# Patient Record
Sex: Female | Born: 2000 | Race: White | Hispanic: No | Smoking: Former smoker
Health system: Southern US, Community
[De-identification: ages and names within clinical notes are randomized; demographics above are authoritative.]

## PROBLEM LIST (undated history)

## (undated) DIAGNOSIS — I493 Ventricular premature depolarization: Secondary | ICD-10-CM

## (undated) DIAGNOSIS — R0789 Other chest pain: Secondary | ICD-10-CM

## (undated) DIAGNOSIS — I10 Essential (primary) hypertension: Secondary | ICD-10-CM

## (undated) DIAGNOSIS — O139 Gestational [pregnancy-induced] hypertension without significant proteinuria, unspecified trimester: Secondary | ICD-10-CM

## (undated) DIAGNOSIS — R011 Cardiac murmur, unspecified: Secondary | ICD-10-CM

## (undated) DIAGNOSIS — F419 Anxiety disorder, unspecified: Secondary | ICD-10-CM

## (undated) DIAGNOSIS — Z464 Encounter for fitting and adjustment of orthodontic device: Secondary | ICD-10-CM

## (undated) DIAGNOSIS — IMO0001 Reserved for inherently not codable concepts without codable children: Secondary | ICD-10-CM

## (undated) DIAGNOSIS — O24419 Gestational diabetes mellitus in pregnancy, unspecified control: Secondary | ICD-10-CM

## (undated) DIAGNOSIS — I340 Nonrheumatic mitral (valve) insufficiency: Secondary | ICD-10-CM

## (undated) DIAGNOSIS — R002 Palpitations: Secondary | ICD-10-CM

## (undated) DIAGNOSIS — F32A Depression, unspecified: Secondary | ICD-10-CM

## (undated) DIAGNOSIS — K589 Irritable bowel syndrome without diarrhea: Secondary | ICD-10-CM

## (undated) HISTORY — PX: TONSILLECTOMY: SUR1361

## (undated) HISTORY — DX: Gestational diabetes mellitus in pregnancy, unspecified control: O24.419

## (undated) HISTORY — DX: Depression, unspecified: F32.A

## (undated) HISTORY — DX: Morbid (severe) obesity due to excess calories: E66.01

## (undated) HISTORY — DX: Other chest pain: R07.89

## (undated) HISTORY — DX: Nonrheumatic mitral (valve) insufficiency: I34.0

## (undated) HISTORY — DX: Palpitations: R00.2

---
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
## 2020-01-01 ENCOUNTER — Other Ambulatory Visit: Payer: Self-pay

## 2020-01-01 ENCOUNTER — Ambulatory Visit (INDEPENDENT_AMBULATORY_CARE_PROVIDER_SITE_OTHER): Payer: Medicaid Other | Admitting: Certified Nurse Midwife

## 2020-01-01 VITALS — BP 124/86 | HR 64 | Wt 255.5 lb

## 2020-01-01 DIAGNOSIS — Z3490 Encounter for supervision of normal pregnancy, unspecified, unspecified trimester: Secondary | ICD-10-CM

## 2020-01-01 DIAGNOSIS — O219 Vomiting of pregnancy, unspecified: Secondary | ICD-10-CM

## 2020-01-01 MED ORDER — DOXYLAMINE-PYRIDOXINE 10-10 MG PO TBEC
10.0000 mg | DELAYED_RELEASE_TABLET | Freq: Every day | ORAL | 1 refills | Status: DC
Start: 2020-01-01 — End: 2020-02-12

## 2020-01-01 NOTE — Patient Instructions (Signed)
WHAT OB PATIENTS CAN EXPECT   Confirmation of pregnancy and ultrasound ordered if medically indicated-[redacted] weeks gestation  New OB (NOB) intake with nurse and New OB (NOB) labs- [redacted] weeks gestation  New OB (NOB) physical examination with provider- 11/[redacted] weeks gestation  Flu vaccine-[redacted] weeks gestation  Anatomy scan-[redacted] weeks gestation  Glucose tolerance test, blood work to test for anemia, T-dap vaccine-[redacted] weeks gestation  Vaginal swabs/cultures-STD/Group B strep-[redacted] weeks gestation  Appointments every 4 weeks until 28 weeks  Every 2 weeks from 28 weeks until 36 weeks  Weekly visits from 36 weeks until delivery  Morning Sickness  Morning sickness is when you feel sick to your stomach (nauseous) during pregnancy. You may feel sick to your stomach and throw up (vomit). You may feel sick in the morning, but you can feel this way at any time of day. Some women feel very sick to their stomach and cannot stop throwing up (hyperemesis gravidarum). Follow these instructions at home: Medicines  Take over-the-counter and prescription medicines only as told by your doctor. Do not take any medicines until you talk with your doctor about them first.  Taking multivitamins before getting pregnant can stop or lessen the harshness of morning sickness. Eating and drinking  Eat dry toast or crackers before getting out of bed.  Eat 5 or 6 small meals a day.  Eat dry and bland foods like rice and baked potatoes.  Do not eat greasy, fatty, or spicy foods.  Have someone cook for you if the smell of food causes you to feel sick or throw up.  If you feel sick to your stomach after taking prenatal vitamins, take them at night or with a snack.  Eat protein when you need a snack. Nuts, yogurt, and cheese are good choices.  Drink fluids throughout the day.  Try ginger ale made with real ginger, ginger tea made from fresh grated ginger, or ginger candies. General instructions  Do not use any products  that have nicotine or tobacco in them, such as cigarettes and e-cigarettes. If you need help quitting, ask your doctor.  Use an air purifier to keep the air in your house free of smells.  Get lots of fresh air.  Try to avoid smells that make you feel sick.  Try: ? Wearing a bracelet that is used for seasickness (acupressure wristband). ? Going to a doctor who puts thin needles into certain body points (acupuncture) to improve how you feel. Contact a doctor if:  You need medicine to feel better.  You feel dizzy or light-headed.  You are losing weight. Get help right away if:  You feel very sick to your stomach and cannot stop throwing up.  You pass out (faint).  You have very bad pain in your belly. Summary  Morning sickness is when you feel sick to your stomach (nauseous) during pregnancy.  You may feel sick in the morning, but you can feel this way at any time of day.  Making some changes to what you eat may help your symptoms go away. This information is not intended to replace advice given to you by your health care provider. Make sure you discuss any questions you have with your health care provider. Document Revised: 02/24/2017 Document Reviewed: 04/14/2016 Elsevier Patient Education  2020 [employer]. How a Baby Grows During Pregnancy  Pregnancy begins when a female's sperm enters a female's egg (fertilization). Fertilization usually happens in one of the tubes (fallopian tubes) that connect the ovaries to the  womb (uterus). The fertilized egg moves down the fallopian tube to the uterus. Once it reaches the uterus, it implants into the lining of the uterus and begins to grow. For the first 10 weeks, the fertilized egg is called an embryo. After 10 weeks, it is called a fetus. As the fetus continues to grow, it receives oxygen and nutrients through tissue (placenta) that grows to support the developing baby. The placenta is the life support system for the baby. It provides  oxygen and nutrition and removes waste. Learning as much as you can about your pregnancy and how your baby is developing can help you enjoy the experience. It can also make you aware of when there might be a problem and when to ask questions. How long does a typical pregnancy last? A pregnancy usually lasts 280 days, or about 40 weeks. Pregnancy is divided into three periods of growth, also called trimesters:  First trimester: 0-12 weeks.  Second trimester: 13-27 weeks.  Third trimester: 28-40 weeks. The day when your baby is ready to be born (full term) is your estimated date of delivery. How does my baby develop month by month? First month  The fertilized egg attaches to the inside of the uterus.  Some cells will form the placenta. Others will form the fetus.  The arms, legs, brain, spinal cord, lungs, and heart begin to develop.  At the end of the first month, the heart begins to beat. Second month  The bones, inner ear, eyelids, hands, and feet form.  The genitals develop.  By the end of 8 weeks, all major organs are developing. Third month  All of the internal organs are forming.  Teeth develop below the gums.  Bones and muscles begin to grow. The spine can flex.  The skin is transparent.  Fingernails and toenails begin to form.  Arms and legs continue to grow longer, and hands and feet develop.  The fetus is about 3 inches (7.6 cm) long. Fourth month  The placenta is completely formed.  The external sex organs, neck, outer ear, eyebrows, eyelids, and fingernails are formed.  The fetus can hear, swallow, and move its arms and legs.  The kidneys begin to produce urine.  The skin is covered with a white, waxy coating (vernix) and very fine hair (lanugo). Fifth month  The fetus moves around more and can be felt for the first time (quickening).  The fetus starts to sleep and wake up and may begin to suck its finger.  The nails grow to the end of the  fingers.  The organ in the digestive system that makes bile (gallbladder) functions and helps to digest nutrients.  If your baby is a girl, eggs are present in her ovaries. If your baby is a boy, testicles start to move down into his scrotum. Sixth month  The lungs are formed.  The eyes open. The brain continues to develop.  Your baby has fingerprints and toe prints. Your baby's hair grows thicker.  At the end of the second trimester, the fetus is about 9 inches (22.9 cm) long. Seventh month  The fetus kicks and stretches.  The eyes are developed enough to sense changes in light.  The hands can make a grasping motion.  The fetus responds to sound. Eighth month  All organs and body systems are fully developed and functioning.  Bones harden, and taste buds develop. The fetus may hiccup.  Certain areas of the brain are still developing. The skull remains soft.   Ninth month  The fetus gains about  lb (0.23 kg) each week.  The lungs are fully developed.  Patterns of sleep develop.  The fetus's head typically moves into a head-down position (vertex) in the uterus to prepare for birth.  The fetus weighs 6-9 lb (2.72-4.08 kg) and is 19-20 inches (48.26-50.8 cm) long. What can I do to have a healthy pregnancy and help my baby develop? General instructions  Take prenatal vitamins as directed by your health care provider. These include vitamins such as folic acid, iron, calcium, and vitamin D. They are important for healthy development.  Take medicines only as directed by your health care provider. Read labels and ask a pharmacist or your health care provider whether over-the-counter medicines, supplements, and prescription drugs are safe to take during pregnancy.  Keep all follow-up visits as directed by your health care provider. This is important. Follow-up visits include prenatal care and screening tests. How do I know if my baby is developing well? At each prenatal visit,  your health care provider will do several different tests to check on your health and keep track of your baby's development. These include:  Fundal height and position. ? Your health care provider will measure your growing belly from your pubic bone to the top of the uterus using a tape measure. ? Your health care provider will also feel your belly to determine your baby's position.  Heartbeat. ? An ultrasound in the first trimester can confirm pregnancy and show a heartbeat, depending on how far along you are. ? Your health care provider will check your baby's heart rate at every prenatal visit.  Second trimester ultrasound. ? This ultrasound checks your baby's development. It also may show your baby's gender. What should I do if I have concerns about my baby's development? Always talk with your health care provider about any concerns that you may have about your pregnancy and your baby. Summary  A pregnancy usually lasts 280 days, or about 40 weeks. Pregnancy is divided into three periods of growth, also called trimesters.  Your health care provider will monitor your baby's growth and development throughout your pregnancy.  Follow your health care provider's recommendations about taking prenatal vitamins and medicines during your pregnancy.  Talk with your health care provider if you have any concerns about your pregnancy or your developing baby. This information is not intended to replace advice given to you by your health care provider. Make sure you discuss any questions you have with your health care provider. Document Revised: 07/05/2018 Document Reviewed: 01/25/2017 Elsevier Patient Education  [place]. Common Medications Safe in Pregnancy  Acne:      Constipation:  Benzoyl Peroxide     Colace  Clindamycin      Dulcolax Suppository  Topica Erythromycin     Fibercon  Salicylic Acid      Metamucil         Miralax AVOID:         Senakot   Accutane    Cough:  Retin-A       Cough Drops  Tetracycline      Phenergan w/ Codeine if Rx  Minocycline      Robitussin (Plain & DM)  Antibiotics:     Crabs/Lice:  Ceclor       RID  Cephalosporins    AVOID:  E-Mycins      Kwell  Keflex  Macrobid/Macrodantin   Diarrhea:  Penicillin      Kao-Pectate  Zithromax  Imodium AD         PUSH FLUIDS AVOID:       Cipro     Fever:  Tetracycline      Tylenol (Regular or Extra  Minocycline       Strength)  Levaquin      Extra Strength-Do not          Exceed 8 tabs/24 hrs Caffeine:        '200mg'$ /day (equiv. To 1 cup of coffee or  approx. 3 12 oz sodas)         Gas: Cold/Hayfever:       Gas-X  Benadryl      Mylicon  Claritin       Phazyme  **Claritin-D        Chlor-Trimeton    Headaches:  Dimetapp      ASA-Free Excedrin  Drixoral-Non-Drowsy     Cold Compress  Mucinex (Guaifenasin)     Tylenol (Regular or Extra  Sudafed/Sudafed-12 Hour     Strength)  **Sudafed PE Pseudoephedrine   Tylenol Cold & Sinus     Vicks Vapor Rub  Zyrtec  **AVOID if Problems With Blood Pressure         Heartburn: Avoid lying down for at least 1 hour after meals  Aciphex      Maalox     Rash:  Milk of Magnesia     Benadryl    Mylanta       1% Hydrocortisone Cream  Pepcid  Pepcid Complete   Sleep Aids:  Prevacid      Ambien   Prilosec       Benadryl  Rolaids       Chamomile Tea  Tums (Limit 4/day)     Unisom         Tylenol PM         Warm milk-add vanilla or  Hemorrhoids:       Sugar for taste  Anusol/Anusol H.C.  (RX: Analapram 2.5%)  Sugar Substitutes:  Hydrocortisone OTC     Ok in moderation  Preparation H      Tucks        Vaseline lotion applied to tissue with wiping    Herpes:     Throat:  Acyclovir      Oragel  Famvir  Valtrex     Vaccines:         Flu Shot Leg Cramps:       *Gardasil  Benadryl      Hepatitis A         Hepatitis B Nasal Spray:       Pneumovax  Saline Nasal Spray     Polio  Booster         Tetanus Nausea:       Tuberculosis test or PPD  Vitamin B6 25 mg TID   AVOID:    Dramamine      *Gardasil  Emetrol       Live Poliovirus  Ginger Root 250 mg QID    MMR (measles, mumps &  High Complex Carbs @ Bedtime    rebella)  Sea Bands-Accupressure    Varicella (Chickenpox)  Unisom 1/2 tab TID     *No known complications           If received before Pain:         Known pregnancy;   Darvocet       Resume series after  Lortab        Delivery  Percocet  Yeast:   Tramadol      Femstat  Tylenol 3      Gyne-lotrimin  Ultram       Monistat  Vicodin           MISC:         All Sunscreens           Hair Coloring/highlights          Insect Repellant's          (Including DEET)         [facility]  [address] [place], [place], [place] [address]  Phone: [phone]   [facility] (second location)  [address] [place], [place] [address]  Phone: [phone]   [facility]) [place], [place], [place] [address] Phone: [phone]   [facility] [place], [place] [address]  Phone: [phone]

## 2020-01-02 LAB — URINALYSIS, ROUTINE W REFLEX MICROSCOPIC
Bilirubin, UA: NEGATIVE
Glucose, UA: NEGATIVE
Ketones, UA: NEGATIVE
Nitrite, UA: NEGATIVE
RBC, UA: NEGATIVE
Specific Gravity, UA: >=1.03 — AB (ref 1.005–1.030)
Urobilinogen, Ur: 0.2 mg/dL (ref 0.2–1.0)
pH, UA: 5.5 (ref 5.0–7.5)

## 2020-01-02 LAB — HEMOGLOBIN A1C
Est. average glucose Bld gHb Est-mCnc: 123 mg/dL
Hgb A1c MFr Bld: 5.9 % — ABNORMAL HIGH (ref 4.8–5.6)

## 2020-01-02 LAB — ANTIBODY SCREEN: Antibody Screen: NEGATIVE

## 2020-01-02 LAB — RPR: RPR Ser Ql: NONREACTIVE

## 2020-01-02 LAB — TSH: TSH: 2.67 u[IU]/mL (ref 0.450–4.500)

## 2020-01-02 LAB — HIV ANTIBODY (ROUTINE TESTING W REFLEX): HIV Screen 4th Generation wRfx: NONREACTIVE

## 2020-01-02 LAB — VARICELLA ZOSTER ANTIBODY, IGG: Varicella zoster IgG: <135 index — ABNORMAL LOW (ref >165)

## 2020-01-02 LAB — MICROSCOPIC EXAMINATION
Casts: NONE SEEN /lpf
Epithelial Cells (non renal): >10 /hpf — AB (ref 0–10)

## 2020-01-02 LAB — HEPATITIS B SURFACE ANTIGEN: Hepatitis B Surface Ag: NEGATIVE

## 2020-01-02 LAB — RUBELLA SCREEN: Rubella Antibodies, IGG: <0.9 index — ABNORMAL LOW (ref >0.99)

## 2020-01-03 LAB — URINE CULTURE, OB REFLEX

## 2020-01-03 LAB — CULTURE, OB URINE

## 2020-01-05 LAB — GC/CHLAMYDIA PROBE AMP
Chlamydia trachomatis, NAA: NEGATIVE
Neisseria Gonorrhoeae by PCR: NEGATIVE

## 2020-01-07 ENCOUNTER — Other Ambulatory Visit: Payer: Self-pay | Admitting: Certified Nurse Midwife

## 2020-01-07 DIAGNOSIS — Z789 Other specified health status: Secondary | ICD-10-CM

## 2020-01-08 ENCOUNTER — Other Ambulatory Visit: Payer: Self-pay

## 2020-01-08 ENCOUNTER — Ambulatory Visit (INDEPENDENT_AMBULATORY_CARE_PROVIDER_SITE_OTHER): Payer: Medicaid Other

## 2020-01-08 DIAGNOSIS — Z789 Other specified health status: Secondary | ICD-10-CM | POA: Diagnosis not present

## 2020-01-08 DIAGNOSIS — Z3A08 8 weeks gestation of pregnancy: Secondary | ICD-10-CM | POA: Diagnosis not present

## 2020-02-05 ENCOUNTER — Encounter: Payer: Self-pay | Admitting: Emergency Medicine

## 2020-02-05 ENCOUNTER — Emergency Department
Admission: EM | Admit: 2020-02-05 | Discharge: 2020-02-05 | Disposition: A | Discharge status: Home / Self Care | Payer: Medicaid Other | Attending: Student in an Organized Health Care Education/Training Program | Admitting: Student in an Organized Health Care Education/Training Program

## 2020-02-05 ENCOUNTER — Other Ambulatory Visit: Payer: Self-pay

## 2020-02-05 DIAGNOSIS — Z87891 Personal history of nicotine dependence: Secondary | ICD-10-CM | POA: Diagnosis not present

## 2020-02-05 DIAGNOSIS — Z20822 Contact with and (suspected) exposure to covid-19: Secondary | ICD-10-CM | POA: Insufficient documentation

## 2020-02-05 DIAGNOSIS — R0981 Nasal congestion: Secondary | ICD-10-CM

## 2020-02-05 DIAGNOSIS — I10 Essential (primary) hypertension: Secondary | ICD-10-CM | POA: Diagnosis not present

## 2020-02-05 DIAGNOSIS — J069 Acute upper respiratory infection, unspecified: Secondary | ICD-10-CM | POA: Diagnosis not present

## 2020-02-05 DIAGNOSIS — O99512 Diseases of the respiratory system complicating pregnancy, second trimester: Secondary | ICD-10-CM | POA: Diagnosis not present

## 2020-02-05 DIAGNOSIS — Z3A13 13 weeks gestation of pregnancy: Secondary | ICD-10-CM | POA: Diagnosis not present

## 2020-02-05 LAB — RESPIRATORY PANEL BY RT PCR (FLU A&B, COVID)
Influenza A by PCR: NEGATIVE
Influenza B by PCR: NEGATIVE
SARS Coronavirus 2 by RT PCR: NEGATIVE

## 2020-02-05 LAB — CBC
HCT: 37.3 % (ref 36.0–46.0)
Hemoglobin: 12.8 g/dL (ref 12.0–15.0)
MCH: 29 pg (ref 26.0–34.0)
MCHC: 34.3 g/dL (ref 30.0–36.0)
MCV: 84.6 fL (ref 80.0–100.0)
Platelets: 257 10*3/uL (ref 150–400)
RBC: 4.41 MIL/uL (ref 3.87–5.11)
RDW: 12.2 % (ref 11.5–15.5)
WBC: 7.9 10*3/uL (ref 4.0–10.5)
nRBC: 0 % (ref 0.0–0.2)

## 2020-02-05 LAB — BASIC METABOLIC PANEL
Anion gap: 11 (ref 5–15)
BUN: 6 mg/dL (ref 6–20)
CO2: 20 mmol/L — ABNORMAL LOW (ref 22–32)
Calcium: 9.1 mg/dL (ref 8.9–10.3)
Chloride: 105 mmol/L (ref 98–111)
Creatinine, Ser: 0.37 mg/dL — ABNORMAL LOW (ref 0.44–1.00)
GFR, Estimated: >60 mL/min (ref >60)
Glucose, Bld: 141 mg/dL — ABNORMAL HIGH (ref 70–99)
Potassium: 3.6 mmol/L (ref 3.5–5.1)
Sodium: 136 mmol/L (ref 135–145)

## 2020-02-05 MED ORDER — AMOXICILLIN 200 MG/5ML PO SUSR: 250.0000 mg | Freq: Three times a day (TID) | ORAL | 0 refills | Status: AC

## 2020-02-05 MED ORDER — SODIUM CHLORIDE 0.9 % IV BOLUS: 1000.0000 mL | Freq: Once | INTRAVENOUS | Status: DC

## 2020-02-05 NOTE — ED Triage Notes (Signed)
Called lab to check on blood results.  Lab states no blood received.  Will redraw patient.

## 2020-02-05 NOTE — Discharge Instructions (Signed)
Be sure to continue drinking plenty of fluids.  Follow-up with your PCP as well as OB/GYN.  I sent a prescription for amoxicillin to your pharmacy.  Please start taking this you take it 3 times daily.  Please return if you have any worsening symptoms additional questions or concerns.

## 2020-02-05 NOTE — ED Provider Notes (Signed)
[facility] Emergency Department Provider Note    First MD Initiated Contact with Patient 02/05/20 1752     (approximate)  I have reviewed the triage vital signs and the nursing notes.   HISTORY  Chief Complaint Epistaxis    HPI Stacy Soto is a 19 y.o. female is roughly [redacted] weeks pregnant presents to the ER for evaluation of upper respiratory symptoms including congestion sore throat itchy eyes for the past 7 to 10 days.  States she is taken to negative Covid test.  States during pregnancy she has had issues with nausea and decreased p.o. intake.  She has felt some lightheadedness.  No numbness or tingling.  No severe headaches.  No chest pain or shortness of breath.  Not been on any antibiotics.  Does have a history of sinusitis.    Past Medical History:  Diagnosis Date  . Anxiety   . Cold    head cold for last 7 days (06/14/16)  improving  . Heart murmur    innocent  . Hypertension   . Orthodontics    wears braces  . PVCs (premature ventricular contractions)    Family History  Problem Relation Age of Onset  . Other Mother        Laurette Schimke problems  . Diabetes Father   . Breast cancer Paternal Grandmother   . Diabetes Paternal Grandfather    Past Surgical History:  Procedure Laterality Date  . ADENOIDECTOMY  2005  . NASAL TURBINATE REDUCTION Left 06/28/2016   Procedure: TURBINATE REDUCTION/SUBMUCOSAL RESECTION;  Surgeon: Geanie Logan, MD;  Location: [facility];  Service: ENT;  Laterality: Left;  . SEPTOPLASTY N/A 06/28/2016   Procedure: SEPTOPLASTY;  Surgeon: Geanie Logan, MD;  Location: [facility];  Service: ENT;  Laterality: N/A;  . TONSILLECTOMY  2005  . TONSILLECTOMY     Patient Active Problem List   Diagnosis Date Noted  . Encounter for IUD insertion 02/27/2019      Prior to Admission medications   Medication Sig Start Date End Date Taking? Authorizing Provider  amoxicillin (AMOXIL) 200 MG/5ML suspension Take 6.3  mLs (250 mg total) by mouth 3 (three) times daily for 5 days. 02/05/20 02/10/20  Willy Eddy, MD  dicyclomine (BENTYL) 10 MG capsule Take 1 capsule (10 mg total) by mouth 4 (four) times daily for 14 days. 10/25/19 11/08/19  Enid Derry, PA-C  Doxylamine-Pyridoxine 10-10 MG TBEC Take 10 mg by mouth daily. 01/01/20   Doreene Burke, CNM  ondansetron (ZOFRAN) 4 MG tablet Take 1 tablet (4 mg total) by mouth daily as needed for nausea or vomiting. Patient not taking: Reported on 01/01/2020 10/25/19 10/24/20  Enid Derry, PA-C  ondansetron (ZOFRAN-ODT) 4 MG disintegrating tablet Take 1 tablet (4 mg total) by mouth every 8 (eight) hours as needed for nausea or vomiting. Patient not taking: Reported on 01/01/2020 09/11/19   Kem Boroughs B, FNP  sertraline (ZOLOFT) 100 MG tablet Take by mouth. 02/15/19 04/16/19  [provider]    Allergies Patient has no known allergies.    Social History Social History   Tobacco Use  . Smoking status: Former Smoker    Types: Cigarettes    Quit date: 11/01/2019    Years since quitting: 0.2  . Smokeless tobacco: Never Used  Vaping Use  . Vaping Use: Never used  Substance Use Topics  . Alcohol use: Never  . Drug use: Never    Review of Systems Patient denies headaches, rhinorrhea, blurry vision, numbness, shortness  of breath, chest pain, edema, cough, abdominal pain, nausea, vomiting, diarrhea, dysuria, fevers, rashes or hallucinations unless otherwise stated above in HPI. ____________________________________________   PHYSICAL EXAM:  VITAL SIGNS: Vitals:   02/05/20 1724 02/05/20 1952  BP: 139/87 134/78  Pulse: (!) 110 96  Resp: 16 18  Temp: 97.9 F (36.6 C)   SpO2: 98% 100%    Constitutional: Alert and oriented.  Eyes: Conjunctivae are normal. Conjunctival injection, no purulence, no proptosis,  Head: Atraumatic. Nose: + rhonrhea and congestion,  bilat nasal polyps, no sign of bleeding Mouth/Throat: Mucous membranes are moist.    Neck: No stridor. Painless ROM.  Cardiovascular: Normal rate, regular rhythm. Grossly normal heart sounds.  Good peripheral circulation. Respiratory: Normal respiratory effort.  No retractions. Lungs CTAB. Gastrointestinal: Soft and nontender. No distention. No abdominal bruits. No CVA tenderness. Genitourinary: deferred Musculoskeletal: No lower extremity tenderness nor edema.  No joint effusions. Neurologic:  Normal speech and language. No gross focal neurologic deficits are appreciated. No facial droop Skin:  Skin is warm, dry and intact. No rash noted. Psychiatric: Mood and affect are normal. Speech and behavior are normal.  ____________________________________________   LABS (all labs ordered are listed, but only abnormal results are displayed)  Results for orders placed or performed during the hospital encounter of 02/05/20 (from the past 24 hour(s))  Basic metabolic panel     Status: Abnormal   Collection Time: 02/05/20  5:23 PM  Result Value Ref Range   Sodium 136 135 - 145 mmol/L   Potassium 3.6 3.5 - 5.1 mmol/L   Chloride 105 98 - 111 mmol/L   CO2 20 (L) 22 - 32 mmol/L   Glucose, Bld 141 (H) 70 - 99 mg/dL   BUN 6 6 - 20 mg/dL   Creatinine, Ser 3.76 (L) 0.44 - 1.00 mg/dL   Calcium 9.1 8.9 - 28.3 mg/dL   GFR, Estimated >15 >17 mL/min   Anion gap 11 5 - 15  CBC     Status: None   Collection Time: 02/05/20  5:23 PM  Result Value Ref Range   WBC 7.9 4.0 - 10.5 K/uL   RBC 4.41 3.87 - 5.11 MIL/uL   Hemoglobin 12.8 12.0 - 15.0 g/dL   HCT 61.6 36 - 46 %   MCV 84.6 80.0 - 100.0 fL   MCH 29.0 26.0 - 34.0 pg   MCHC 34.3 30.0 - 36.0 g/dL   RDW 07.3 71.0 - 62.6 %   Platelets 257 150 - 400 K/uL   nRBC 0.0 0.0 - 0.2 %  Respiratory Panel by RT PCR (Flu A&B, Covid) - Nasopharyngeal Swab     Status: None   Collection Time: 02/05/20  6:59 PM   Specimen: Nasopharyngeal Swab  Result Value Ref Range   SARS Coronavirus 2 by RT PCR NEGATIVE NEGATIVE   Influenza A by PCR NEGATIVE  NEGATIVE   Influenza B by PCR NEGATIVE NEGATIVE   ____________________________________________  EKG My review and personal interpretation at Time: 16:10   Indication: uri  Rate: 110  Rhythm: sinus Axis: normal Other: normal intervals, no stemi ____________________________________________  RADIOLOGY  EMERGENCY DEPARTMENT [place] PREGNANCY "Study: Limited Ultrasound of the Pelvis for Pregnancy"  INDICATIONS:Pregnancy(required) Multiple views of the uterus and pelvic cavity were obtained in real-time with a multi-frequency probe.  APPROACH:Transabdominal  PERFORMED BY: Myself IMAGES ARCHIVED?: No LIMITATIONS: none PREGNANCY FREE FLUID: Present ADNEXAL FINDINGS: none INTERPRETATION: Reassuring pelvic ultrasound with IUP and reassuring fetal movement     ____________________________________________   PROCEDURES  Procedure(s) performed:  Procedures    Critical Care performed: no ____________________________________________   INITIAL IMPRESSION / ASSESSMENT AND PLAN / ED COURSE  Pertinent labs & imaging results that were available during my care of the patient were reviewed by me and considered in my medical decision making (see chart for details).   DDX: URI, sinusitis, epistaxis, Covid, dehydration, pneumonia, CVT, strep throat  Stacy Soto is a 19 y.o. who presents to the ED with presentation as described above.  She is afebrile she is nontoxic.  No sign of epistaxis at this time.  No clinical exam findings to suggest pneumonia.  Blood work is reassuring.  Bedside ultrasound also reassuring she is primarily concerned about the baby and felt reassured after seeing the ultrasound.  She does have findings consistent with sinusitis and URI.  I did recommend repeating a Covid as many of her symptoms are consistent with this.  If that is negative however will plan to treat for sinusitis given that she has been having the symptoms for over 7 days I think it course of antibiotics  would be appropriate.  Clinical Course as of Feb 04 2322  Wed Feb 05, 2020  3614 Feel she needs IV fluids at this time she is otherwise very well-appearing clinically.  Do believe she is appropriate for outpatient follow-up.  Discussed usual return precautions.   [PR]    Clinical Course User Index [PR] Willy Eddy, MD    The patient was evaluated in Emergency Department today for the symptoms described in the history of present illness. He/she was evaluated in the context of the global COVID-19 pandemic, which necessitated consideration that the patient might be at risk for infection with the SARS-CoV-2 virus that causes COVID-19. Institutional protocols and algorithms that pertain to the evaluation of patients at risk for COVID-19 are in a state of rapid change based on information released by regulatory bodies including the CDC and federal and state organizations. These policies and algorithms were followed during the patient's care in the ED.  As part of my medical decision making, I reviewed the following data within the electronic MEDICAL RECORD NUMBER Nursing notes reviewed and incorporated, Labs reviewed, notes from prior ED visits and [place] Controlled Substance Database   ____________________________________________   FINAL CLINICAL IMPRESSION(S) / ED DIAGNOSES  Final diagnoses:  Upper respiratory tract infection, unspecified type  Congestion of nasal sinus      NEW MEDICATIONS STARTED DURING THIS VISIT:  Discharge Medication List as of 02/05/2020  7:33 PM    START taking these medications   Details  amoxicillin (AMOXIL) 200 MG/5ML suspension Take 6.3 mLs (250 mg total) by mouth 3 (three) times daily for 5 days., Starting Wed 02/05/2020, Until Mon 02/10/2020, Normal         Note:  This document was prepared using Dragon voice recognition software and may include unintentional dictation errors.    Willy Eddy, MD 02/05/20 [phone]

## 2020-02-05 NOTE — ED Triage Notes (Signed)
See first RN Note. Pt A&O x4. Clamp removed in triage, bleeding noted to be stopped at this time.   Pt also c/o dizziness, states is [redacted] weeks pregnant at this time. Pt also noted to be tachycardic. Pt otherwise A&O x4.

## 2020-02-05 NOTE — ED Triage Notes (Signed)
First Nurse NOte:  Arrives with c/o nose bleed x 10 min PTA.  Nose clamp placed on patient.  Bleeding appears to be controlled.  AAOx3.  Skin warm and dry. NAD

## 2020-02-05 NOTE — ED Notes (Signed)
Unable to have pt sign discharge d/t computer froze when pulling up topaz. PT verbalizes d/c instructions and denies further questions.

## 2020-02-12 ENCOUNTER — Other Ambulatory Visit: Payer: Self-pay

## 2020-02-12 ENCOUNTER — Encounter: Payer: Self-pay | Admitting: Certified Nurse Midwife

## 2020-02-12 ENCOUNTER — Ambulatory Visit (INDEPENDENT_AMBULATORY_CARE_PROVIDER_SITE_OTHER): Payer: Medicaid Other | Admitting: Certified Nurse Midwife

## 2020-02-12 DIAGNOSIS — Z3A13 13 weeks gestation of pregnancy: Secondary | ICD-10-CM | POA: Diagnosis not present

## 2020-02-12 DIAGNOSIS — O99211 Obesity complicating pregnancy, first trimester: Secondary | ICD-10-CM

## 2020-02-12 DIAGNOSIS — Z8679 Personal history of other diseases of the circulatory system: Secondary | ICD-10-CM

## 2020-02-12 DIAGNOSIS — I493 Ventricular premature depolarization: Secondary | ICD-10-CM

## 2020-02-12 LAB — POCT URINALYSIS DIPSTICK OB
Bilirubin, UA: NEGATIVE
Blood, UA: NEGATIVE
Glucose, UA: NEGATIVE
Ketones, UA: NEGATIVE
Leukocytes, UA: NEGATIVE
Nitrite, UA: NEGATIVE
POC,PROTEIN,UA: NEGATIVE
Spec Grav, UA: 1.015 (ref 1.010–1.025)
Urobilinogen, UA: 0.2 E.U./dL
pH, UA: 5 (ref 5.0–8.0)

## 2020-02-12 MED ORDER — ASPIRIN EC 81 MG PO TBEC: 81.0000 mg | DELAYED_RELEASE_TABLET | Freq: Every day | ORAL | 11 refills | Status: DC

## 2020-02-12 NOTE — Patient Instructions (Signed)

## 2020-02-12 NOTE — Progress Notes (Signed)
NEW OB HISTORY AND PHYSICAL  SUBJECTIVE:       Stacy Soto is a 19 y.o. [id number] female, Patient's last menstrual period was 11/09/2019 (exact date)., Estimated Date of Delivery: 08/15/20, [redacted]w[redacted]d, presents today for establishment of Prenatal Care. She has no unusual complaints .  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 Normal Contraception: none Last Pap: n/a   Obstetric History OB History  Gravida Para Term Preterm AB Living  1       0    SAB TAB Ectopic Multiple Live Births  0            # Outcome Date GA Lbr Len/2nd Weight Sex Delivery Anes PTL Lv  1 Current             Past Medical History:  Diagnosis Date  . Anxiety   . Cold    head cold for last 7 days (06/14/16)  improving  . Heart murmur    innocent  . Hypertension   . Orthodontics    wears braces  . PVCs (premature ventricular contractions)     Past Surgical History:  Procedure Laterality Date  . ADENOIDECTOMY  2005  . NASAL TURBINATE REDUCTION Left 06/28/2016   Procedure: TURBINATE REDUCTION/SUBMUCOSAL RESECTION;  Surgeon: Geanie Logan, MD;  Location: [facility];  Service: ENT;  Laterality: Left;  . SEPTOPLASTY N/A 06/28/2016   Procedure: SEPTOPLASTY;  Surgeon: Geanie Logan, MD;  Location: [facility];  Service: ENT;  Laterality: N/A;  . TONSILLECTOMY  2005  . TONSILLECTOMY      Current Outpatient Medications on File Prior to Visit  Medication Sig Dispense Refill  . dicyclomine (BENTYL) 10 MG capsule Take 1 capsule (10 mg total) by mouth 4 (four) times daily for 14 days. 30 capsule 0  . Doxylamine-Pyridoxine 10-10 MG TBEC Take 10 mg by mouth daily. 60 tablet 1  . ondansetron (ZOFRAN) 4 MG tablet Take 1 tablet (4 mg total) by mouth daily as needed for nausea or  vomiting. (Patient not taking: Reported on 01/01/2020) 8 tablet 0  . ondansetron (ZOFRAN-ODT) 4 MG disintegrating tablet Take 1 tablet (4 mg total) by mouth every 8 (eight) hours as needed for nausea or vomiting. (Patient not taking: Reported on 01/01/2020) 20 tablet 0  . sertraline (ZOLOFT) 100 MG tablet Take by mouth.     No current facility-administered medications on file prior to visit.    No Known Allergies  Social History   Socioeconomic History  . Marital status: Single    Spouse name: Not on file  . Number of children: Not on file  . Years of education: Not on file  . Highest education level: Not on file  Occupational History  . Not on file  Tobacco Use  . Smoking status: Former Smoker    Types: Cigarettes    Quit date: 11/01/2019    Years since quitting: 0.2  . Smokeless tobacco: Never Used  Vaping Use  . Vaping Use: Never used  Substance and Sexual Activity  . Alcohol use: Never  . Drug use: Never  . Sexual activity: Yes    Birth control/protection: None  Other Topics Concern  . Not on file  Social History Narrative  . Not on file   Social Determinants of Health   Financial Resource Strain:   .  Difficulty of Paying Living Expenses: Not on file  Food Insecurity:   . Worried About Programme researcher, broadcasting/film/video in the Last Year: Not on file  . Ran Out of Food in the Last Year: Not on file  Transportation Needs:   . Lack of Transportation (Medical): Not on file  . Lack of Transportation (Non-Medical): Not on file  Physical Activity:   . Days of Exercise per Week: Not on file  . Minutes of Exercise per Session: Not on file  Stress:   . Feeling of Stress : Not on file  Social Connections:   . Frequency of Communication with Friends and Family: Not on file  . Frequency of Social Gatherings with Friends and Family: Not on file  . Attends Religious Services: Not on file  . Active Member of Clubs or Organizations: Not on file  . Attends Banker Meetings: Not  on file  . Marital Status: Not on file  Intimate Partner Violence:   . Fear of Current or Ex-Partner: Not on file  . Emotionally Abused: Not on file  . Physically Abused: Not on file  . Sexually Abused: Not on file    Family History  Problem Relation Age of Onset  . Other Mother        Laurette Schimke problems  . Diabetes Father   . Breast cancer Paternal Grandmother   . Diabetes Paternal Grandfather     The following portions of the patient's history were reviewed and updated as appropriate: allergies, current medications, past OB history, past medical history, past surgical history, past family history, past social history, and problem list.    OBJECTIVE: Initial Physical Exam (New OB)  GENERAL APPEARANCE: alert, well appearing, in no apparent distress, oriented to person, place and time, overweight HEAD: normocephalic, atraumatic MOUTH: mucous membranes moist, pharynx normal without lesions THYROID: no thyromegaly or masses present BREASTS: no masses noted, no significant tenderness, no palpable axillary nodes, no skin changes LUNGS: clear to auscultation, no wheezes, rales or rhonchi, symmetric air entry HEART: regular rate and rhythm, no murmurs ABDOMEN: soft, nontender, nondistended, no abnormal masses, no epigastric pain, obese and FHT not heard EXTREMITIES: no redness or tenderness in the calves or thighs, no edema, no limitation in range of motion, intact peripheral pulses SKIN: normal coloration and turgor, no rashes LYMPH NODES: no adenopathy palpable NEUROLOGIC: alert, oriented, normal speech, no focal findings or movement disorder noted FHT with U?S 174 PELVIC EXAM EXTERNAL GENITALIA: normal appearing vulva with no masses, tenderness or lesions VAGINA: no abnormal discharge or lesions CERVIX: no lesions or cervical motion tenderness UTERUS: gravid ADNEXA: no masses palpable and nontender OB EXAM PELVIMETRY: appears adequate RECTUM: exam not  indicated  ASSESSMENT: Normal pregnancy  PLAN: New OB counseling: The patient has been given an overview regarding routine prenatal care. Recommendations regarding diet, weight gain( 11-15 lbs) and exercise in pregnancy were given.Prenatal testing, optional genetic testing, carrier screening , and ultrasound use in pregnancy were reviewed. Discussed use of aspirin daily, orders placed. Discussed anesthesia consult due to elevated BMI. Orders placed for cardiology consult. Benefits of Breast Feeding were discussed. The patient is encouraged to consider nursing her baby post partum.  Doreene Burke, CNM

## 2020-02-13 LAB — CBC
Hematocrit: 36.2 % (ref 34.0–46.6)
Hemoglobin: 12.7 g/dL (ref 11.1–15.9)
MCH: 29.2 pg (ref 26.6–33.0)
MCHC: 35.1 g/dL (ref 31.5–35.7)
MCV: 83 fL (ref 79–97)
Platelets: 281 10*3/uL (ref 150–450)
RBC: 4.35 x10E6/uL (ref 3.77–5.28)
RDW: 12.5 % (ref 11.7–15.4)
WBC: 7.6 10*3/uL (ref 3.4–10.8)

## 2020-02-13 LAB — HEPATITIS C ANTIBODY: Hep C Virus Ab: <0.1 s/co ratio (ref 0.0–0.9)

## 2020-02-25 ENCOUNTER — Telehealth: Payer: Self-pay

## 2020-02-25 NOTE — Telephone Encounter (Signed)
Informed patient of gender results per her request. States Rosana Fret will pick up an envelope with gender. Informed patient Dahlia Client will need to show ID before results released.

## 2020-02-25 NOTE — Telephone Encounter (Signed)
mychart message sent to patient re: genetic test results and gender

## 2020-03-02 ENCOUNTER — Other Ambulatory Visit: Payer: Self-pay

## 2020-03-02 ENCOUNTER — Ambulatory Visit (INDEPENDENT_AMBULATORY_CARE_PROVIDER_SITE_OTHER): Payer: Medicaid Other | Admitting: Internal Medicine

## 2020-03-02 ENCOUNTER — Encounter: Payer: Self-pay | Admitting: Internal Medicine

## 2020-03-02 VITALS — BP 138/80 | HR 85 | Ht 65.0 in | Wt 249.0 lb

## 2020-03-02 DIAGNOSIS — R0602 Shortness of breath: Secondary | ICD-10-CM

## 2020-03-02 DIAGNOSIS — I10 Essential (primary) hypertension: Secondary | ICD-10-CM | POA: Insufficient documentation

## 2020-03-02 DIAGNOSIS — R011 Cardiac murmur, unspecified: Secondary | ICD-10-CM | POA: Diagnosis not present

## 2020-03-02 DIAGNOSIS — O10912 Unspecified pre-existing hypertension complicating pregnancy, second trimester: Secondary | ICD-10-CM | POA: Insufficient documentation

## 2020-03-02 DIAGNOSIS — M7989 Other specified soft tissue disorders: Secondary | ICD-10-CM | POA: Diagnosis not present

## 2020-03-02 NOTE — Patient Instructions (Signed)
Medication Instructions:  Your physician recommends that you continue on your current medications as directed. Please refer to the Current Medication list given to you today.  *If you need a refill on your cardiac medications before your next appointment, please call your pharmacy*  Lab Work: Your physician recommends that you return for lab work in: TODAY - Serum aldosterone - plasma renin activity ratio.  If you have labs (blood work) drawn today and your tests are completely normal, you will receive your results only by: Marland Kitchen MyChart Message (if you have MyChart) OR . A paper copy in the mail If you have any lab test that is abnormal or we need to change your treatment, we will call you to review the results.   Testing/Procedures: 1- Your physician has requested that you have an echocardiogram. Echocardiography is a painless test that uses sound waves to create images of your heart. It provides your doctor with information about the size and shape of your heart and how well your heart's chambers and valves are working. This procedure takes approximately one hour. There are no restrictions for this procedure. There is a possibility that an IV may need to be started during your test to inject an image enhancing agent. This is done to obtain more optimal pictures of your heart. Therefore we ask that you do at least drink some water prior to coming in to hydrate your veins.    2- Your physician has requested that you have a renal artery duplex. During this test, an ultrasound is used to evaluate blood flow to the kidneys. Allow one hour for this exam. Do not eat after midnight the day before and avoid carbonated beverages. Take your medications as you usually do.  No food after 11PM the night before.  Water is OK. (Don't drink liquids if you have been instructed not to for ANOTHER test).  Take two Extra-Strength Gas-X capsules at bedtime the night before test.   Take an additional two  Extra-Strength Gas-X capsules three (3) hours before the test or first thing in the morning.    Avoid foods that produce bowel gas, for 24 hours prior to exam (see below).    No breakfast, no chewing gum, no smoking or carbonated beverages.  Patient may take morning medications with water.  Come in for test at least 15 minutes early to register.    Follow-Up: At [facility], you and your health needs are our priority.  As part of our continuing mission to provide you with exceptional heart care, we have created designated Provider Care Teams.  These Care Teams include your primary Cardiologist (physician) and Advanced Practice Providers (APPs -  Physician Assistants and Nurse Practitioners) who all work together to provide you with the care you need, when you need it.  We recommend signing up for the patient portal called "MyChart".  Sign up information is provided on this After Visit Summary.  MyChart is used to connect with patients for Virtual Visits (Telemedicine).  Patients are able to view lab/test results, encounter notes, upcoming appointments, etc.  Non-urgent messages can be sent to your provider as well.   To learn more about what you can do with MyChart, go to [email or web address].    Your next appointment:   6 week(s)  The format for your next appointment:   In Person  Provider:   You may see DR Cristal Deer END or one of the following Advanced Practice Providers on your designated Care Team:    Cristal Deer  Brion Aliment, NP  Eula Listen, PA-C  Marisue Ivan, PA-C  Cadence [place], PA-C  Gillian Shields, NP

## 2020-03-02 NOTE — Progress Notes (Signed)
New Outpatient Visit Date: 03/02/2020  Referring Provider: Doreene Burke, CNM [address] [place],  [place] [address]  Chief Complaint: Elevated blood pressure  HPI:  Ms. Stiggers is a 19 y.o. pregnant female ([redacted]w[redacted]d) who is being seen today for the evaluation of heart murmur at the request of Ms. Janee Morn. She has a history of hypertension diagnosed in middle school and heart murmur previously followed by serial echocardiograms (no records available).  Ms. Chou reports a long history of hypertension for which she has been treated with multiple medications.  She was switched to labetolol 200 mg BID earlier this year after it was discovered that she was pregnant.  She was tolerating the medication well but stopped taking it a couple of months ago as she was concerned about potential adverse effects to her and her fetus.  Ms. Harth reports a long history of palpitations that led to Holter monitor placement.  She reports never having received the results.  She continues to feel intermittent palpitations that she describes as an extra beat as well as sharp pains in her sides that often make her stop whatever she is doing.  Palpitations occur almost daily.  She also reports intermittent shortness of breath and dizziness, which sometimes accompanies the aforementioned palpitations.  The shortness of breath sometimes comes out of nowhere and predates her pregnancy.  She also feels like breathing cold air will sometimes cause chest pain and shortness of breath.  Over the last few weeks, Ms. Ozer has noticed more swelling, particularly in her calves.  The swelling improves at night with leg elevation but does not completely resolve.  She has frequent lightheadedness but has not fallen or passed out.  This has been a longstanding problem but seems to have worsened during her pregnancy.  She is scheduled for follow-up with her midwife later this  week.  --------------------------------------------------------------------------------------------------  Cardiovascular History & Procedures: Cardiovascular Problems:  Shortness of breath  Palpitations  Early-onset hypertension  Risk Factors:  Hypertension and obesity  Cath/PCI:  None  CV Surgery:  None  EP Procedures and Devices:  Holter monitor (12/2012): Occasional PVC's.  Non-Invasive Evaluation(s):  Exercise tolerance test (05/13/2014, [facility]): Results not available.  TTE (01/22/2013, [facility]): Normal.  Recent CV Pertinent Labs: Lab Results  Component Value Date   CHOL 164 01/22/2013   HDL 54 01/22/2013   LDLCALC 64 01/22/2013   TRIG 231 (H) 01/22/2013   K 3.6 02/05/2020   K 4.0 03/10/2014   BUN 6 02/05/2020   BUN 10 03/10/2014   CREATININE 0.37 (L) 02/05/2020   CREATININE 0.63 03/10/2014    --------------------------------------------------------------------------------------------------  Past Medical History:  Diagnosis Date  . Anxiety   . Cold    head cold for last 7 days (06/14/16)  improving  . Heart murmur    innocent  . Hypertension   . Orthodontics    wears braces  . PVCs (premature ventricular contractions)     Past Surgical History:  Procedure Laterality Date  . ADENOIDECTOMY  2005  . NASAL TURBINATE REDUCTION Left 06/28/2016   Procedure: TURBINATE REDUCTION/SUBMUCOSAL RESECTION;  Surgeon: Geanie Logan, MD;  Location[facility];  Service: ENT;  Laterality: Left;  . SEPTOPLASTY N/A 06/28/2016   Procedure: SEPTOPLASTY;  Surgeon: Geanie Logan, MD;  Location: [facility];  Service: ENT;  Laterality: N/A;  . TONSILLECTOMY  2005  . TONSILLECTOMY      Current Meds  Medication Sig  . aspirin EC 81 MG tablet Take 1 tablet (  81 mg total) by mouth daily. Swallow whole.  . Prenatal Vit-Fe Fumarate-FA (PRENATAL MULTIVITAMIN) TABS tablet Take 1 tablet by mouth daily at 12 noon.    Allergies: Patient has no known  allergies.  Social History   Tobacco Use  . Smoking status: Former Smoker    Types: Cigarettes    Quit date: 11/01/2019    Years since quitting: 0.3  . Smokeless tobacco: Never Used  Vaping Use  . Vaping Use: Never used  Substance Use Topics  . Alcohol use: Never  . Drug use: Never    Family History  Problem Relation Age of Onset  . Other Mother        Laurette Schimke problems  . Diabetes Father   . Breast cancer Paternal Grandmother   . Diabetes Paternal Grandfather     Review of Systems: A 12-system review of systems was performed and was negative except as noted in the HPI.  --------------------------------------------------------------------------------------------------  Physical Exam: BP 138/80 (BP Location: Right Arm, Patient Position: Sitting, Cuff Size: Normal)   Pulse 85   Ht 5\' 5"  (1.651 m)   Wt 249 lb (112.9 kg)   LMP 11/09/2019 (Exact Date)   SpO2 98%   BMI 41.44 kg/m   General:  NAD. HEENT: No conjunctival pallor or scleral icterus. Facemask in place. Neck: Supple without lymphadenopathy, thyromegaly, JVD, or HJR. No carotid bruit. Lungs: Normal work of breathing. Clear to auscultation bilaterally without wheezes or crackles. Heart: Regular rate and rhythm without murmurs, rubs, or gallops. Unable to assess PMI due to body habitus. Abd: Bowel sounds present. Soft, NT/ND.  Unable to assess HSM due to body habitus. Ext: No lower extremity edema. Radial, PT, and DP pulses are 2+ bilaterally Skin: Warm and dry without rash. Neuro: CNIII-XII intact. Strength and fine-touch sensation intact in upper and lower extremities bilaterally. Psych: Normal mood and affect.  EKG:  Normal sinus rhythm without abnormality.  Lab Results  Component Value Date   WBC 7.6 02/12/2020   HGB 12.7 02/12/2020   HCT 36.2 02/12/2020   MCV 83 02/12/2020   PLT 281 02/12/2020    Lab Results  Component Value Date   NA 136 02/05/2020   K 3.6 02/05/2020   CL 105 02/05/2020   CO2 20  (L) 02/05/2020   BUN 6 02/05/2020   CREATININE 0.37 (L) 02/05/2020   GLUCOSE 141 (H) 02/05/2020   ALT 33 12/14/2019    Lab Results  Component Value Date   CHOL 164 01/22/2013   HDL 54 01/22/2013   LDLCALC 64 01/22/2013   TRIG 231 (H) 01/22/2013     --------------------------------------------------------------------------------------------------  ASSESSMENT AND PLAN: Hypertension: Ms. Bonnie has a long history of hypertension that began at least 5 years ago.  She was previously evaluated by pediatric cardiology at [facility]; hypertension was attributed to obesity.  Blood pressure today is suboptimal at 140/90, particularly in setting of her gravid state.  I will reach out to Ms. [facility] to confirm that she is in agreement with restarting labetolol.  We will also initiate a workup for secondary hypertension, including renal artery Doppler and serum aldosterone:plasma renin activity.  CTA chest from 08/24/2018 was personally reviewed and does not show evidence of coarctation of the aorta.  Sleep study may also need to be considered in the future to exclude OSA.  Shortness of breath and leg swelling: Dyspnea has been longstanding and is most likely due to obesity confounded by physiologic changes related to pregnancy.  I do not appreciate a heart  murmur today, though one has been noted in the past.  Pediatric echocardiogram in 2016 was normal.  Given worsening symptoms, we have agreed to repeat an echocardiogram to exclude structural abnormalities.  Heart murmur: No murmur appreciated today but previously documented by other providers.  We will obtain an echocardiogram, as above.  Follow-up: Return to clinic in 6 weeks.  Yvonne Kendall, MD 03/02/2020 9:40 AM

## 2020-03-03 ENCOUNTER — Telehealth: Payer: Self-pay | Admitting: *Deleted

## 2020-03-03 MED ORDER — LABETALOL HCL 200 MG PO TABS: 200.0000 mg | ORAL_TABLET | Freq: Two times a day (BID) | ORAL | 4 refills | Status: DC

## 2020-03-03 NOTE — Telephone Encounter (Signed)
No answer. Left message to call back.   

## 2020-03-03 NOTE — Telephone Encounter (Signed)
-----   Message from Yvonne Kendall, MD sent at 03/03/2020  9:42 AM EST ----- Merrily Pew,  Could you let Ms. Lamantia know that I have spoked with her midwife and we agree to restart labetolol?  If you could call in an Rx for labetolol 200 mg BID, I would greatly appreciate it.  Thayer Ohm ----- Message ----- From: Doreene Burke, CNM Sent: 03/03/2020   8:56 AM EST To: Yvonne Kendall, MD  Thank you for seeing her and reaching out to me. Labetalol in pregnancy is safe. Will you be putting in the order or shall I?  Doreene Burke, CNM   ----- Message ----- From: Yvonne Kendall, MD Sent: 03/02/2020   1:19 PM EST To: Doreene Burke, CNM  Hello Ms. Janee Morn,  I had the pleasure of meeting Ms. Dona in the office today.  Her blood pressure was elevated at 140/90.  I would favor restarting labetolol 200 mg twice daily but wanted to touch base with you to make sure this is okay.  We will also pursue a workup for secondary hypertension, given her young age and early onset of hypertension.  Please let me know if any questions or concerns arise.  Chris End

## 2020-03-03 NOTE — Telephone Encounter (Signed)
Patient called back and verbalized understanding to start labetalol 200 mg by mouth two times a day. Rx sent to pharmacy.

## 2020-03-06 ENCOUNTER — Telehealth: Payer: Self-pay

## 2020-03-06 ENCOUNTER — Ambulatory Visit (INDEPENDENT_AMBULATORY_CARE_PROVIDER_SITE_OTHER): Payer: Medicaid Other | Admitting: Certified Nurse Midwife

## 2020-03-06 ENCOUNTER — Other Ambulatory Visit: Payer: Self-pay

## 2020-03-06 ENCOUNTER — Other Ambulatory Visit: Payer: Medicaid Other

## 2020-03-06 ENCOUNTER — Encounter: Payer: Self-pay | Admitting: Certified Nurse Midwife

## 2020-03-06 ENCOUNTER — Other Ambulatory Visit (HOSPITAL_COMMUNITY)
Admission: RE | Admit: 2020-03-06 | Discharge: 2020-03-06 | Disposition: A | Discharge status: Home / Self Care | Payer: Medicaid Other | Source: Ambulatory Visit | Attending: Certified Nurse Midwife | Admitting: Certified Nurse Midwife

## 2020-03-06 VITALS — BP 123/88 | HR 88 | Wt 251.0 lb

## 2020-03-06 DIAGNOSIS — O09899 Supervision of other high risk pregnancies, unspecified trimester: Secondary | ICD-10-CM

## 2020-03-06 DIAGNOSIS — Z2839 Other underimmunization status: Secondary | ICD-10-CM | POA: Insufficient documentation

## 2020-03-06 DIAGNOSIS — O23592 Infection of other part of genital tract in pregnancy, second trimester: Secondary | ICD-10-CM | POA: Diagnosis not present

## 2020-03-06 DIAGNOSIS — Z3689 Encounter for other specified antenatal screening: Secondary | ICD-10-CM

## 2020-03-06 DIAGNOSIS — N898 Other specified noninflammatory disorders of vagina: Secondary | ICD-10-CM

## 2020-03-06 DIAGNOSIS — B9689 Other specified bacterial agents as the cause of diseases classified elsewhere: Secondary | ICD-10-CM | POA: Insufficient documentation

## 2020-03-06 DIAGNOSIS — Z3A16 16 weeks gestation of pregnancy: Secondary | ICD-10-CM | POA: Insufficient documentation

## 2020-03-06 DIAGNOSIS — Z283 Underimmunization status: Secondary | ICD-10-CM

## 2020-03-06 DIAGNOSIS — O99891 Other specified diseases and conditions complicating pregnancy: Secondary | ICD-10-CM

## 2020-03-06 DIAGNOSIS — R7303 Prediabetes: Secondary | ICD-10-CM

## 2020-03-06 DIAGNOSIS — O26892 Other specified pregnancy related conditions, second trimester: Secondary | ICD-10-CM | POA: Diagnosis present

## 2020-03-06 DIAGNOSIS — O99210 Obesity complicating pregnancy, unspecified trimester: Secondary | ICD-10-CM

## 2020-03-06 DIAGNOSIS — Z3402 Encounter for supervision of normal first pregnancy, second trimester: Secondary | ICD-10-CM

## 2020-03-06 DIAGNOSIS — Z6710 Type A blood, Rh positive: Secondary | ICD-10-CM

## 2020-03-06 LAB — POCT URINALYSIS DIPSTICK OB
Bilirubin, UA: NEGATIVE
Blood, UA: NEGATIVE
Ketones, UA: NEGATIVE
Nitrite, UA: NEGATIVE
POC,PROTEIN,UA: NEGATIVE
Spec Grav, UA: 1.015 (ref 1.010–1.025)
Urobilinogen, UA: 0.2 E.U./dL
pH, UA: 7.5 (ref 5.0–8.0)

## 2020-03-06 NOTE — Patient Instructions (Signed)

## 2020-03-06 NOTE — Progress Notes (Signed)
Pt present for routine prenatal visit. Increase in vaginal discharge with an odor often.

## 2020-03-06 NOTE — Progress Notes (Signed)
ROB-Reports occasional nausea with vomiting and increased vaginal discharge. Discussed home treatment measures, see AVS. Vaginal swab self collected, see orders. FHT present and fetal movement seen on bedside ultrasound. Anticipatory guidance regarding course of prenatal care. Reviewed red flag symptoms and when to call. RTC x 4 weeks for ANATOMY SCAN and ROB or sooner if needed.

## 2020-03-06 NOTE — Telephone Encounter (Signed)
Spoke with Crystal with [facility] regarding lab results for the  Aldosterone + renin activity w/ ratio. Crystal stated the patients lab work should result on Monday, 03/09/2020 due a delay in the process of labs.

## 2020-03-07 ENCOUNTER — Other Ambulatory Visit: Payer: Self-pay | Admitting: Certified Nurse Midwife

## 2020-03-07 DIAGNOSIS — R7309 Other abnormal glucose: Secondary | ICD-10-CM

## 2020-03-07 DIAGNOSIS — Z131 Encounter for screening for diabetes mellitus: Secondary | ICD-10-CM

## 2020-03-07 DIAGNOSIS — R7303 Prediabetes: Secondary | ICD-10-CM

## 2020-03-07 LAB — COMPREHENSIVE METABOLIC PANEL
ALT: 11 IU/L (ref 0–32)
AST: 10 IU/L (ref 0–40)
Albumin/Globulin Ratio: 1.5 (ref 1.2–2.2)
Albumin: 3.8 g/dL — ABNORMAL LOW (ref 3.9–5.0)
Alkaline Phosphatase: 56 IU/L (ref 42–106)
BUN/Creatinine Ratio: 8 — ABNORMAL LOW (ref 9–23)
BUN: 4 mg/dL — ABNORMAL LOW (ref 6–20)
Bilirubin Total: <0.2 mg/dL (ref 0.0–1.2)
CO2: 17 mmol/L — ABNORMAL LOW (ref 20–29)
Calcium: 9.2 mg/dL (ref 8.7–10.2)
Chloride: 103 mmol/L (ref 96–106)
Creatinine, Ser: 0.5 mg/dL — ABNORMAL LOW (ref 0.57–1.00)
GFR calc Af Amer: 162 mL/min/{1.73_m2} (ref >59)
GFR calc non Af Amer: 141 mL/min/{1.73_m2} (ref >59)
Globulin, Total: 2.6 g/dL (ref 1.5–4.5)
Glucose: 164 mg/dL — ABNORMAL HIGH (ref 65–99)
Potassium: 3.9 mmol/L (ref 3.5–5.2)
Sodium: 137 mmol/L (ref 134–144)
Total Protein: 6.4 g/dL (ref 6.0–8.5)

## 2020-03-07 LAB — PROTEIN / CREATININE RATIO, URINE
Creatinine, Urine: 95.7 mg/dL
Protein, Ur: 17.6 mg/dL
Protein/Creat Ratio: 184 mg/g creat (ref 0–200)

## 2020-03-07 LAB — GLUCOSE, 1 HOUR GESTATIONAL: Gestational Diabetes Screen: 160 mg/dL — ABNORMAL HIGH (ref 65–139)

## 2020-03-08 ENCOUNTER — Other Ambulatory Visit: Payer: Self-pay | Admitting: Certified Nurse Midwife

## 2020-03-09 ENCOUNTER — Other Ambulatory Visit: Payer: Self-pay | Admitting: Certified Nurse Midwife

## 2020-03-09 DIAGNOSIS — B9689 Other specified bacterial agents as the cause of diseases classified elsewhere: Secondary | ICD-10-CM

## 2020-03-09 DIAGNOSIS — O23599 Infection of other part of genital tract in pregnancy, unspecified trimester: Secondary | ICD-10-CM

## 2020-03-09 LAB — CERVICOVAGINAL ANCILLARY ONLY
Bacterial Vaginitis (gardnerella): POSITIVE — AB
Candida Glabrata: NEGATIVE
Candida Vaginitis: NEGATIVE
Comment: NEGATIVE
Comment: NEGATIVE
Comment: NEGATIVE

## 2020-03-09 MED ORDER — METRONIDAZOLE 500 MG PO TABS: 500.0000 mg | ORAL_TABLET | Freq: Two times a day (BID) | ORAL | 0 refills | Status: AC

## 2020-03-09 NOTE — Progress Notes (Signed)
Rx Flagyl, see orders.    Stacy Soto, CNM [facility], [facility] 03/09/20 12:10 PM

## 2020-03-10 ENCOUNTER — Other Ambulatory Visit: Payer: Self-pay

## 2020-03-10 ENCOUNTER — Other Ambulatory Visit: Payer: Medicaid Other

## 2020-03-10 DIAGNOSIS — Z131 Encounter for screening for diabetes mellitus: Secondary | ICD-10-CM

## 2020-03-10 DIAGNOSIS — R7303 Prediabetes: Secondary | ICD-10-CM

## 2020-03-10 DIAGNOSIS — R7309 Other abnormal glucose: Secondary | ICD-10-CM

## 2020-03-11 LAB — GESTATIONAL GLUCOSE TOLERANCE
Glucose, Fasting: 107 mg/dL — ABNORMAL HIGH (ref 65–94)
Glucose, GTT - 1 Hour: 208 mg/dL — ABNORMAL HIGH (ref 65–179)
Glucose, GTT - 2 Hour: 180 mg/dL — ABNORMAL HIGH (ref 65–154)
Glucose, GTT - 3 Hour: 106 mg/dL (ref 65–139)

## 2020-03-11 LAB — ALDOSTERONE + RENIN ACTIVITY W/ RATIO
ALDOS/RENIN RATIO: 4.7 (ref 0.0–30.0)
ALDOSTERONE: 20.6 ng/dL (ref 0.0–30.0)
Renin: 4.34 ng/mL/hr (ref 0.167–5.380)

## 2020-03-12 ENCOUNTER — Other Ambulatory Visit: Payer: Self-pay | Admitting: Certified Nurse Midwife

## 2020-03-12 DIAGNOSIS — Z3A17 17 weeks gestation of pregnancy: Secondary | ICD-10-CM

## 2020-03-12 DIAGNOSIS — O24419 Gestational diabetes mellitus in pregnancy, unspecified control: Secondary | ICD-10-CM | POA: Insufficient documentation

## 2020-03-12 DIAGNOSIS — Z3402 Encounter for supervision of normal first pregnancy, second trimester: Secondary | ICD-10-CM

## 2020-03-12 NOTE — Progress Notes (Signed)
Referral to [employer], see orders.    Serafina Royals, CNM [facility], [facility] 03/12/20 3:52 PM

## 2020-03-25 ENCOUNTER — Encounter: Payer: Medicaid Other | Attending: Certified Nurse Midwife | Admitting: *Deleted

## 2020-03-25 ENCOUNTER — Encounter: Payer: Self-pay | Admitting: *Deleted

## 2020-03-25 ENCOUNTER — Other Ambulatory Visit: Payer: Self-pay

## 2020-03-25 VITALS — BP 130/72 | Ht 66.0 in | Wt 253.3 lb

## 2020-03-25 DIAGNOSIS — Z3A17 17 weeks gestation of pregnancy: Secondary | ICD-10-CM | POA: Diagnosis not present

## 2020-03-25 DIAGNOSIS — O24419 Gestational diabetes mellitus in pregnancy, unspecified control: Secondary | ICD-10-CM | POA: Diagnosis present

## 2020-03-25 DIAGNOSIS — O24410 Gestational diabetes mellitus in pregnancy, diet controlled: Secondary | ICD-10-CM

## 2020-03-25 NOTE — Patient Instructions (Signed)
Read booklet on Gestational Diabetes Follow Gestational Meal Planning Guidelines Don't skip meals Avoid sugar sweetened drinks (juice, tea) Complete a 3 Day Food Record and bring to next appointment Check blood sugars 4 x day - before breakfast and 2 hrs after every meal and record  Bring blood sugar log to all appointments Call MD for prescription for meter strips and lancets Strips  Accu-Chek Guide Lancets   Accu-Chek Softclix Purchase urine ketone strips if instructed by MD and check urine ketones every am:  If + increase bedtime snack to 1 protein and 2 carbohydrate servings Walk 20-30 minutes at least 5 x week if permitted by MD

## 2020-03-25 NOTE — Progress Notes (Signed)
Diabetes Self-Management Education  Visit Type: First/Initial  Appt. Start Time: 0815 Appt. End Time: 1015  03/25/2020  [name], identified by name and date of birth, is a 19 y.o. female with a diagnosis of Diabetes: Gestational Diabetes.   ASSESSMENT  Blood pressure 130/72, height 5\' 6"  (1.676 m), weight 253 lb 4.8 oz (114.9 kg), last menstrual period 11/09/2019, estimated date of delivery 08/15/2020 Body mass index is 40.88 kg/m.   Diabetes Self-Management Education - 03/25/20 1055      Visit Information   Visit Type First/Initial      Initial Visit   Diabetes Type Gestational Diabetes    Are you currently following a meal plan? No   "I don't eat full meals mostly because I can't keep food down"   Are you taking your medications as prescribed? Yes    Date Diagnosed Dec 2021      Health Coping   How would you rate your overall health? Fair      Psychosocial Assessment   Patient Belief/Attitude about Diabetes Other (comment)   "upset, discouraged"   Self-care barriers None    Self-management support Doctor's office;Family;Friends    Patient Concerns Nutrition/Meal planning;Weight Control;Healthy Lifestyle    Special Needs None    Preferred Learning Style Auditory;Hands on    Learning Readiness Ready    How often do you need to have someone help you when you read instructions, pamphlets, or other written materials from your doctor or pharmacy? 1 - Never    What is the last grade level you completed in school? high school      Pre-Education Assessment   Patient understands the diabetes disease and treatment process. Needs Instruction    Patient understands incorporating nutritional management into lifestyle. Needs Instruction    Patient undertands incorporating physical activity into lifestyle. Needs Instruction    Patient understands using medications safely. Needs Instruction    Patient understands monitoring blood glucose, interpreting and using results Needs  Instruction    Patient understands prevention, detection, and treatment of acute complications. Needs Instruction    Patient understands prevention, detection, and treatment of chronic complications. Needs Instruction    Patient understands how to develop strategies to address psychosocial issues. Needs Instruction    Patient understands how to develop strategies to promote health/change behavior. Needs Instruction      Complications   Last HgB A1C per patient/outside source 5.9 %   01/01/2020   How often do you check your blood sugar? 0 times/day (not testing)   Provided Accu-Chek Guide Me meter and instructed on use. BG upon return demonstration was 106 mg/dL at 03/02/2020 am - fasting.   Have you had a dilated eye exam in the past 12 months? No    Have you had a dental exam in the past 12 months? Yes    Are you checking your feet? Yes    How many days per week are you checking your feet? 7      Dietary Intake   Breakfast "hardly ever"    Snack (morning) multiple snacks and reports meals are usually snacks - cheese, crackers    Lunch fruit (peaches, berries, bananas, grapes), crackers    Dinner chicken, beef; potatoes, peas, corn, baked beans, occasional rice and pasta, lettuce, carrots, tomatoes, broccolli    Beverage(s) juice, milk, water, sugar sweetened tea      Exercise   Exercise Type Light (walking / raking leaves)    How many days per week to you exercise?  7    How many minutes per day do you exercise? 60    Total minutes per week of exercise 420      Patient Education   Previous Diabetes Education No    Disease state  Definition of diabetes, type 1 and 2, and the diagnosis of diabetes;Factors that contribute to the development of diabetes    Nutrition management  Role of diet in the treatment of diabetes and the relationship between the three main macronutrients and blood glucose level;Food label reading, portion sizes and measuring food.;Reviewed blood glucose goals for pre and  post meals and how to evaluate the patients' food intake on their blood glucose level.    Physical activity and exercise  Role of exercise on diabetes management, blood pressure control and cardiac health.    Medications Other (comment)   Limited use of oral medications during pregnancy and potential for insulin.   Monitoring Taught/evaluated SMBG meter.;Purpose and frequency of SMBG.;Taught/discussed recording of test results and interpretation of SMBG.;Ketone testing, when, how.    Chronic complications Relationship between chronic complications and blood glucose control    Psychosocial adjustment Identified and addressed patients feelings and concerns about diabetes    Preconception care Pregnancy and GDM  Role of pre-pregnancy blood glucose control on the development of the fetus;Reviewed with patient blood glucose goals with pregnancy;Role of family planning for patients with diabetes      Individualized Goals (developed by patient)   Reducing Risk Other (comment)   lose weight, lead a healthier lifestyle     Outcomes   Expected Outcomes Demonstrated interest in learning. Expect positive outcomes    Future DMSE 2 wks           Individualized Plan for Diabetes Self-Management Training:   Learning Objective:  Patient will have a greater understanding of diabetes self-management. Patient education plan is to attend individual and/or group sessions per assessed needs and concerns.   Plan:   Patient Instructions  Read booklet on Gestational Diabetes Follow Gestational Meal Planning Guidelines Don't skip meals Avoid sugar sweetened drinks (juice, tea) Complete a 3 Day Food Record and bring to next appointment Check blood sugars 4 x day - before breakfast and 2 hrs after every meal and record  Bring blood sugar log to all appointments Call MD for prescription for meter strips and lancets Strips  Accu-Chek Guide Lancets   Accu-Chek Softclix Purchase urine ketone strips if instructed  by MD and check urine ketones every am:  If + increase bedtime snack to 1 protein and 2 carbohydrate servings Walk 20-30 minutes at least 5 x week if permitted by MD  Expected Outcomes:  Demonstrated interest in learning. Expect positive outcomes  Education material provided:  Gestational Booklet Gestational Meal Planning Guidelines Simple Meal Plan Viewed Gestational Diabetes Video Meter = Accu-Chek Guide Me 3 Day Food Record Goals for a Healthy Pregnancy  If problems or questions, patient to contact team via:  Sharion Settler, RN, CCM, CDCES [phone]  Future DSME appointment: 2 wks  Apr 06, 2020 with the dietitian

## 2020-03-26 ENCOUNTER — Other Ambulatory Visit: Payer: Self-pay

## 2020-03-26 ENCOUNTER — Telehealth: Payer: Self-pay

## 2020-03-26 MED ORDER — BLOOD GLUCOSE MONITOR KIT
PACK | 0 refills | Status: DC
Start: 2020-03-26 — End: 2020-09-09

## 2020-03-26 MED ORDER — BLOOD GLUCOSE MONITOR KIT: PACK | 0 refills | Status: DC

## 2020-03-26 NOTE — Telephone Encounter (Signed)
mychart message sent

## 2020-03-26 NOTE — Telephone Encounter (Signed)
Please prescribed. Thanks, JML

## 2020-03-26 NOTE — Telephone Encounter (Signed)
Pt called in and stated that she was seen today at [facility], they are requesting an order sent to the pt pharmacy for testing strips and lancets. The pt stated that she uses [facility] on [address]. Please advise

## 2020-03-26 NOTE — Progress Notes (Signed)
Per Dani Gobble, CNM rx blood glucose meter kit

## 2020-03-28 NOTE — L&D Delivery Note (Signed)
Delivery Summary for Stacy Soto  Labor Events:   Preterm labor: No data found  Rupture date: 07/26/2020  Rupture time: 8:28 PM  Rupture type: Artificial  Fluid Color: Clear Pink  Induction: No data found  Augmentation: No data found  Complications: No data found  Cervical ripening: No data found No data found   No data found     Delivery:   Episiotomy: No data found  Lacerations: No data found  Repair suture: No data found  Repair # of packets: No data found  Blood loss (ml): 150   Information for the patient's newborn:  Oriyah, Lamphear [[id number]]    Delivery 07/27/2020 6:30 AM by  Vaginal, Spontaneous Sex:  female Gestational Age: [redacted]w[redacted]d Delivery Clinician:   Living?:         APGARS  One minute Five minutes Ten minutes  Skin color:        Heart rate:        Grimace:        Muscle tone:        Breathing:        Totals: 8  9      Presentation/position:      Resuscitation:   Cord information:    Disposition of cord blood:     Blood gases sent?  Complications:   Placenta: Delivered:       appearance Newborn Measurements: Weight: 6 lb 1.7 oz (2770 g)  Height: 18.5"  Head circumference:    Chest circumference:    Other providers:    Additional  information: Forceps:   Vacuum:   Breech:   Observed anomalies       Delivery Note At 6:30 AM a viable and healthy child was delivered via Vaginal, Spontaneous (Presentation: Vertex; Occiput Anterior).  APGAR: 8, 9; weight  2770 grams.   Placenta status: Spontaneous, Intact.  Cord: 3 vessels with the following complications: Nuchal cord x 1, reducible.  Cord pH: not obtained.  Anesthesia: Epidural Episiotomy: None Lacerations:  Vaginal  Suture Repair: 3.0 vicryl Est. Blood Loss (mL): 150  Mom to postpartum.  Baby to Couplet care / Skin to Skin.  Hildred Laser, MD 07/27/2020, 7:03 AM

## 2020-03-31 ENCOUNTER — Other Ambulatory Visit: Payer: Medicaid Other

## 2020-03-31 ENCOUNTER — Encounter: Payer: Medicaid Other | Admitting: Certified Nurse Midwife

## 2020-04-01 ENCOUNTER — Other Ambulatory Visit: Payer: Self-pay

## 2020-04-01 ENCOUNTER — Telehealth: Payer: Self-pay

## 2020-04-01 ENCOUNTER — Ambulatory Visit (INDEPENDENT_AMBULATORY_CARE_PROVIDER_SITE_OTHER): Payer: Medicaid Other

## 2020-04-01 ENCOUNTER — Encounter: Payer: Self-pay | Admitting: Certified Nurse Midwife

## 2020-04-01 ENCOUNTER — Ambulatory Visit (INDEPENDENT_AMBULATORY_CARE_PROVIDER_SITE_OTHER): Payer: Medicaid Other | Admitting: Certified Nurse Midwife

## 2020-04-01 VITALS — BP 134/77 | HR 115 | Wt 256.2 lb

## 2020-04-01 DIAGNOSIS — Z3689 Encounter for other specified antenatal screening: Secondary | ICD-10-CM

## 2020-04-01 DIAGNOSIS — Z3402 Encounter for supervision of normal first pregnancy, second trimester: Secondary | ICD-10-CM | POA: Diagnosis not present

## 2020-04-01 DIAGNOSIS — Z3A20 20 weeks gestation of pregnancy: Secondary | ICD-10-CM

## 2020-04-01 NOTE — Telephone Encounter (Signed)
error 

## 2020-04-01 NOTE — Progress Notes (Signed)
   ROB doing well.Anatomy u/s today ( see below) .Resutls reviewed with pt. U/s incomplete. Will follow up in 2wks for completion. Discussed musculoskeletal discomforts in pregnancy and self help measures.  Had nutrition consult and has been checking her sugars 4x daily . fastings 85-116, 2hr pp 85-214. Dr. Valentino Saxon consulted. Pt instructed to follow diet closely and document food choices ( especially when BS elevated) . Pt instructed to follow up 1 wk for re evaluation of log. Copy of low scanned into chart.  Follow for ROB with Marcelino Duster in 1 wks. For evaluation of BS.   Doreene Burke, CNM    Patient Name: Stacy Soto DOB: [birth year] MRN: [id number] ULTRASOUND REPORT  Location: [facility] Date of Service: 04/01/2020   Indications:Anatomy Ultrasound Findings:  Mason Jim intrauterine pregnancy is visualized with FHR at 142 BPM. Biometrics give an (U/S) Gestational age of [redacted]w[redacted]d and an (U/S) EDD of 08/16/2020; this correlates with the clinically established Estimated Date of Delivery: 08/15/20  Fetal presentation is transverse.  EFW: 343 g ( 12 oz). Fetal Percentile  Placenta: anterior. Grade: 1 AFI: subjectively normal.  Anatomic survey is incomplete for Heart, Nose/lip and normal; Gender - female.    Right Ovary is normal in appearance. Left Ovary is normal appearance. Survey of the adnexa demonstrates no adnexal masses. There is no free peritoneal fluid in the cul de sac.  Impression: 1. [redacted]w[redacted]d Viable Singleton Intrauterine pregnancy by U/S. 2. (U/S) EDD is consistent with Clinically established Estimated Date of Delivery: 08/15/20 . 3. Incomplete for Heart, Nose/lip  Recommendations: 1.Clinical correlation with the patient's History and Physical Exam.   Jenine M. Marciano Sequin    RDMS

## 2020-04-01 NOTE — Patient Instructions (Signed)

## 2020-04-02 ENCOUNTER — Ambulatory Visit (INDEPENDENT_AMBULATORY_CARE_PROVIDER_SITE_OTHER): Payer: Medicaid Other

## 2020-04-02 ENCOUNTER — Other Ambulatory Visit: Payer: Medicaid Other

## 2020-04-02 DIAGNOSIS — I10 Essential (primary) hypertension: Secondary | ICD-10-CM

## 2020-04-06 ENCOUNTER — Ambulatory Visit: Payer: Medicaid Other | Admitting: Dietician

## 2020-04-08 ENCOUNTER — Other Ambulatory Visit: Payer: Self-pay

## 2020-04-08 ENCOUNTER — Ambulatory Visit (INDEPENDENT_AMBULATORY_CARE_PROVIDER_SITE_OTHER): Payer: Medicaid Other

## 2020-04-08 DIAGNOSIS — Z3A20 20 weeks gestation of pregnancy: Secondary | ICD-10-CM | POA: Diagnosis not present

## 2020-04-08 DIAGNOSIS — Z362 Encounter for other antenatal screening follow-up: Secondary | ICD-10-CM

## 2020-04-09 ENCOUNTER — Other Ambulatory Visit: Payer: Self-pay | Admitting: Internal Medicine

## 2020-04-09 ENCOUNTER — Telehealth: Payer: Self-pay | Admitting: Dietician

## 2020-04-09 DIAGNOSIS — R011 Cardiac murmur, unspecified: Secondary | ICD-10-CM

## 2020-04-09 NOTE — Telephone Encounter (Signed)
Called patient to reschedule her appointment (cancelled due to staff issue) from 04/06/20. Unable to leave message as voicemail has not been set up.

## 2020-04-10 ENCOUNTER — Ambulatory Visit (INDEPENDENT_AMBULATORY_CARE_PROVIDER_SITE_OTHER): Payer: Medicaid Other

## 2020-04-10 ENCOUNTER — Other Ambulatory Visit: Payer: Self-pay

## 2020-04-10 DIAGNOSIS — R011 Cardiac murmur, unspecified: Secondary | ICD-10-CM

## 2020-04-11 LAB — ECHOCARDIOGRAM COMPLETE
AR max vel: 2.7 cm2
AV Area VTI: 2.69 cm2
AV Area mean vel: 2.57 cm2
AV Mean grad: 5 mmHg
AV Peak grad: 8.9 mmHg
Ao pk vel: 1.49 m/s
Area-P 1/2: 3.51 cm2
Calc EF: 53.4 %
S' Lateral: 3.4 cm
Single Plane A2C EF: 53.4 %
Single Plane A4C EF: 53.6 %

## 2020-04-14 ENCOUNTER — Other Ambulatory Visit: Payer: Medicaid Other

## 2020-04-16 ENCOUNTER — Ambulatory Visit: Payer: Medicaid Other | Admitting: Internal Medicine

## 2020-04-20 NOTE — Progress Notes (Signed)
Cardiology Office Note:    Date:  04/22/2020   ID:  HARRIS KISTLER, DOB [birth year], MRN [id number]  PCP:  [facility]  [facility] Cardiologist:  [name], MD  [facility] Electrophysiologist:  None   Referring MD: [facility]*   Chief Complaint: f/u renal doppler and echo  History of Present Illness:    Stacy Soto is a 20 y.o. female is a pregnant female 9w5dwith a hx of hypertension (attributed to obesity followed by [facility]), palpitations, and a heart murmur followed by serial echocardiograms (prior echos available). She was seen 03/02/20 for evaluation of a heart murmur. An echo, renal artery doppler, and serum aldosterone was ordered. Labetolol was restarted.   She returns for follow-up. Echo, renal [place] and lab work were reviewed in detail. She has been taking labetalol. Still doesn't have BP cuff at home but she checks it once a week at her mothers house and BP normally runs 120-130/60-70. BP today 130/72. The patient reports continued fatigue, worse as the pregnancy progresses. 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 Hours have not been too consistent and therefore sleep has been difficult at times. She is at a desk most of the day. Still has dependent lower leg edema. She has a stool to keep her feet propped up to help with swelling and she uses tight socks which also helps. At her job there is a treadmill, a bike and yoga ball which she uses in her down time. Denies chest pain. No worsening orthopnea. She does have some orthostatic symptoms when she stands, she gets dizzy and lightheaded. This is pretty regular. She has to compose herself before walking. This is much worse since the pregnancy and has been getting worse. She is eating and drinking well. I will check orthostatics. Denies fever or chills. She has daily nausea, but no vomiting.   Past Medical History:  Diagnosis Date  . Anxiety   . Cold    head cold for last 7 days  (06/14/16)  improving  . Gestational diabetes   . Heart murmur    innocent  . Hypertension   . Orthodontics    wears braces  . PVCs (premature ventricular contractions)     Past Surgical History:  Procedure Laterality Date  . ADENOIDECTOMY  2005  . NASAL TURBINATE REDUCTION Left 06/28/2016   Procedure: TURBINATE REDUCTION/SUBMUCOSAL RESECTION;  Surgeon: PClyde Canterbury MD;  [place] [facility]  Service: ENT;  Laterality: Left;  . SEPTOPLASTY N/A 06/28/2016   Procedure: SEPTOPLASTY;  Surgeon: PClyde Canterbury MD;  [place] [facility]  Service: ENT;  Laterality: N/A;  . TONSILLECTOMY  2005  . TONSILLECTOMY      Current Medications: Current Meds  Medication Sig  . Accu-Chek FastClix Lancets MISC SMARTSIG:1 Topical 4 Times Daily  . ACCU-CHEK GUIDE test strip USE 1 UP TO 4 TIMES DAILY  . aspirin EC 81 MG tablet Take 1 tablet (81 mg total) by mouth daily. Swallow whole.  . blood glucose meter kit and supplies KIT Dispense based on patient and insurance preference. Use up to four times daily as directed. (FOR ICD-9 250.00, 250.01).  .Marland Kitchenlabetalol (NORMODYNE) 200 MG tablet Take 1 tablet (200 mg total) by mouth 2 (two) times daily.  . Prenatal Vit-Fe Fumarate-FA (PRENATAL MULTIVITAMIN) TABS tablet Take 1 tablet by mouth daily at 12 noon.     Allergies:   Patient has no known allergies.   Social History   Socioeconomic  History  . Marital status: Single    Spouse name: Not on file  . Number of children: Not on file  . Years of education: Not on file  . Highest education level: Not on file  Occupational History  . Not on file  Tobacco Use  . Smoking status: Former Smoker    Years: 1.50    Types: E-cigarettes    Quit date: 11/01/2019    Years since quitting: 0.4  . Smokeless tobacco: Never Used  Vaping Use  . Vaping Use: Never used  Substance and Sexual Activity  . Alcohol use: Never  . Drug use: Never  . Sexual activity: Yes    Birth control/protection: None   Other Topics Concern  . Not on file  Social History Narrative  . Not on file   Social Determinants of Health   Financial Resource Strain: Not on file  Food Insecurity: Not on file  Transportation Needs: Not on file  Physical Activity: Not on file  Stress: Not on file  Social Connections: Not on file     Family History: The patient's *family history includes Breast cancer in her paternal grandmother; Diabetes in her father and paternal grandfather; Other in her mother.  ROS:   Please see the history of present illness.     All other systems reviewed and are negative.  EKGs/Labs/Other Studies Reviewed:    The following studies were reviewed today:  Echo 04/10/20 1. Left ventricular ejection fraction, by estimation, is 60 to 65%. The  left ventricle has normal function. The left ventricle has no regional  wall motion abnormalities. Left ventricular diastolic parameters were  normal. The average left ventricular  global longitudinal strain is -21.0 %. The global longitudinal strain is  normal.  2. Right ventricular systolic function is normal. The right ventricular  size is normal. Tricuspid regurgitation signal is inadequate for assessing  PA pressure.  3. The mitral valve is normal in structure. Mild mitral valve  regurgitation.   EKG:  EKG is not ordered today.    Recent Labs: 01/01/2020: TSH 2.670 02/12/2020: Hemoglobin 12.7; Platelets 281 03/06/2020: ALT 11; BUN 4; Creatinine, Ser 0.50; Potassium 3.9; Sodium 137  Recent Lipid Panel    Component Value Date/Time   CHOL 164 01/22/2013 1450   TRIG 231 (H) 01/22/2013 1450   HDL 54 01/22/2013 1450   VLDL 46 (H) 01/22/2013 1450   LDLCALC 64 01/22/2013 1450     Risk Assessment/Calculations:       Physical Exam:    VS:  BP 130/72 (BP Location: Left Arm, Patient Position: Sitting, Cuff Size: Normal)   Pulse 96   Ht '5\' 6"'  (1.676 m)   Wt 255 lb 6 oz (115.8 kg)   LMP 11/09/2019 (Exact Date)   SpO2 98%   BMI  41.22 kg/m     Wt Readings from Last 3 Encounters:  04/22/20 255 lb 6 oz (115.8 kg)  04/01/20 256 lb 3 oz (116.2 kg) (>99 %, Z= 2.57)*  03/25/20 253 lb 4.8 oz (114.9 kg) (>99 %, Z= 2.55)*   * Growth percentiles are based on CDC (Girls, 2-20 Years) data.     GEN:  Well nourished, well developed in no acute distress HEENT: Normal NECK: No JVD; No carotid bruits LYMPHATICS: No lymphadenopathy CARDIAC: RRR, + murmur, rubs, gallops RESPIRATORY:  Clear to auscultation without rales, wheezing or rhonchi  ABDOMEN: soft, non-tender, distended MUSCULOSKELETAL:  Mild lower leg edema; No deformity  SKIN: Warm and dry NEUROLOGIC:  Alert and  oriented x 3 PSYCHIATRIC:  Normal affect   ASSESSMENT:    1. Hypertension, unspecified type   2. Heart murmur   3. Dizziness   4. Leg swelling   5. Shortness of breath    PLAN:    In order of problems listed above:  Hypertension Dizziness At least 5 year history of hypertension attributed to obesity followed by [facility]. BP suboptimal at the last visit and labetolol restarted. Renal US showed no evidence of renal artery stenosis. Echo showed preserved EF. Labs unremarkable. Weekly Bps recorded at 120-130/70-80s. BP today 130/72. She describes dizziness and lightheadedness upon standing that has gotten worse with pregnancy. She is mildly orthostatic from standing at 0 minutes to 3 minutes (DAP 85>71). Also felt some dizziness during the vitals. She is eating and drinking well. Suspect symptoms mostly due to pregnancy. Encouraged good hydration, slowly standing up as to control symptoms. Continue current dose of labetolol. She will try and obtain a bp cuff to use at home. Will check BMET and CBC today.  SOB and leg swelling Still has lower leg edema and mild shortness of breath. Swelling improves with leg elevation and compression socks. She is staying active as able at her job. Echo with preserved EF. I feelsymptoms mostly related to pregnancy.   Heart  murmur Reportedly followed by serial echos in the past. Recent echo showed mild MR, which is not suspected to be exacerbating symptoms. No significant murmur on exam.  Disposition: Follow up in 6 month(s) with MD/PA   Shared Decision Making/Informed Consent        Signed, Derenda Giddings Ninfa Meeker, PA-C  04/22/2020 9:44 AM    [place]

## 2020-04-22 ENCOUNTER — Other Ambulatory Visit: Payer: Self-pay

## 2020-04-22 ENCOUNTER — Encounter: Payer: Medicaid Other | Attending: Certified Nurse Midwife | Admitting: Dietician

## 2020-04-22 ENCOUNTER — Ambulatory Visit (INDEPENDENT_AMBULATORY_CARE_PROVIDER_SITE_OTHER): Payer: Medicaid Other | Admitting: Medical

## 2020-04-22 ENCOUNTER — Encounter: Payer: Self-pay | Admitting: Physician Assistant

## 2020-04-22 ENCOUNTER — Encounter: Payer: Self-pay | Admitting: Dietician

## 2020-04-22 VITALS — BP 130/72 | HR 96 | Ht 66.0 in | Wt 255.4 lb

## 2020-04-22 VITALS — BP 122/70 | Ht 66.0 in | Wt 235.8 lb

## 2020-04-22 DIAGNOSIS — Z3A17 17 weeks gestation of pregnancy: Secondary | ICD-10-CM | POA: Insufficient documentation

## 2020-04-22 DIAGNOSIS — O24410 Gestational diabetes mellitus in pregnancy, diet controlled: Secondary | ICD-10-CM

## 2020-04-22 DIAGNOSIS — M7989 Other specified soft tissue disorders: Secondary | ICD-10-CM

## 2020-04-22 DIAGNOSIS — R011 Cardiac murmur, unspecified: Secondary | ICD-10-CM | POA: Diagnosis not present

## 2020-04-22 DIAGNOSIS — R42 Dizziness and giddiness: Secondary | ICD-10-CM

## 2020-04-22 DIAGNOSIS — R0602 Shortness of breath: Secondary | ICD-10-CM

## 2020-04-22 DIAGNOSIS — O24419 Gestational diabetes mellitus in pregnancy, unspecified control: Secondary | ICD-10-CM | POA: Insufficient documentation

## 2020-04-22 DIAGNOSIS — I10 Essential (primary) hypertension: Secondary | ICD-10-CM

## 2020-04-22 NOTE — Progress Notes (Signed)
.   Patient's BG record indicates fasting BGs ranging 45-106, and post-meal BGs ranging 108-180. Some low readings due to nausea and eating less; some post-prandial readings taken less than 2 hours after eating, due to grazing to prevent nausea. . Patient did not have recorded food diary, but reports consuming small amounts at a time to avoid overly full feeling, and eating frequently to avoid nausea from empty stomach.    . Provided basic balanced meal plan, and wrote individualized menus based on patient's food preferences. . Instructed patient on basic meal planning including goals for carb intake, importance of protein sources regularly, food options to reduce risk of nausea. . Instructed patient on food safety, including avoidance of Listeriosis, and limiting mercury from fish. . Discussed importance of maintaining healthy lifestyle habits to reduce risk of Type 2 DM as well as Gestational DM with any future pregnancies. . Advised patient to use any remaining testing supplies to test some BGs after delivery, and to have BG tested ideally annually, as well as prior to attempting future pregnancies.

## 2020-04-22 NOTE — Patient Instructions (Signed)
   Continue to eat often during the day, keeping carb to small-moderate amounts (average 30 grams) to prevent high blood sugars.   Make sure to include a source of protein with each meal or snack to meet daily needs and avoid wide fluctuations in blood sugars.   If blood sugar is low (<70), drink 4oz juice or regular soda, or 8oz milk. Allow 15 minutes for symptoms to improve and sugar to get back to normal. Then eat a snack or meal that contains some carb and some protein.

## 2020-04-22 NOTE — Patient Instructions (Signed)
Medication Instructions:  Your physician recommends that you continue on your current medications as directed. Please refer to the Current Medication list given to you today.  *If you need a refill on your cardiac medications before your next appointment, please call your pharmacy*   Lab Work: Your physician recommends that you have lab work TODAY: Bmet, CBC  If you have labs (blood work) drawn today and your tests are completely normal, you will receive your results only by: Marland Kitchen MyChart Message (if you have MyChart) OR . A paper copy in the mail If you have any lab test that is abnormal or we need to change your treatment, we will call you to review the results.   Testing/Procedures: None ordered   Follow-Up: At [facility], you and your health needs are our priority.  As part of our continuing mission to provide you with exceptional heart care, we have created designated Provider Care Teams.  These Care Teams include your primary Cardiologist (physician) and Advanced Practice Providers (APPs -  Physician Assistants and Nurse Practitioners) who all work together to provide you with the care you need, when you need it.  We recommend signing up for the patient portal called "MyChart".  Sign up information is provided on this After Visit Summary.  MyChart is used to connect with patients for Virtual Visits (Telemedicine).  Patients are able to view lab/test results, encounter notes, upcoming appointments, etc.  Non-urgent messages can be sent to your provider as well.   To learn more about what you can do with MyChart, go to [email or web address].    Your next appointment:   6 month(s)  The format for your next appointment:   In Person  Provider:   You may see Dr. Okey Dupre or one of the following Advanced Practice Providers on your designated Care Team:    Nicolasa Ducking, NP  Eula Listen, PA-C  Marisue Ivan, PA-C  Cadence Denver, [place]  Gillian Shields, NP

## 2020-04-23 LAB — CBC
Hematocrit: 34.5 % (ref 34.0–46.6)
Hemoglobin: 12.1 g/dL (ref 11.1–15.9)
MCH: 29.4 pg (ref 26.6–33.0)
MCHC: 35.1 g/dL (ref 31.5–35.7)
MCV: 84 fL (ref 79–97)
Platelets: 244 10*3/uL (ref 150–450)
RBC: 4.11 x10E6/uL (ref 3.77–5.28)
RDW: 12.9 % (ref 11.7–15.4)
WBC: 7.4 10*3/uL (ref 3.4–10.8)

## 2020-04-23 LAB — BASIC METABOLIC PANEL
BUN/Creatinine Ratio: 11 (ref 9–23)
BUN: 5 mg/dL — ABNORMAL LOW (ref 6–20)
CO2: 18 mmol/L — ABNORMAL LOW (ref 20–29)
Calcium: 8.9 mg/dL (ref 8.7–10.2)
Chloride: 107 mmol/L — ABNORMAL HIGH (ref 96–106)
Creatinine, Ser: 0.45 mg/dL — ABNORMAL LOW (ref 0.57–1.00)
GFR calc Af Amer: 167 mL/min/{1.73_m2} (ref >59)
GFR calc non Af Amer: 145 mL/min/{1.73_m2} (ref >59)
Glucose: 108 mg/dL — ABNORMAL HIGH (ref 65–99)
Potassium: 4.3 mmol/L (ref 3.5–5.2)
Sodium: 138 mmol/L (ref 134–144)

## 2020-04-30 ENCOUNTER — Other Ambulatory Visit: Payer: Self-pay

## 2020-04-30 ENCOUNTER — Ambulatory Visit (INDEPENDENT_AMBULATORY_CARE_PROVIDER_SITE_OTHER): Payer: Medicaid Other | Admitting: Certified Nurse Midwife

## 2020-04-30 VITALS — BP 111/78 | HR 92 | Wt 255.4 lb

## 2020-04-30 DIAGNOSIS — Z3402 Encounter for supervision of normal first pregnancy, second trimester: Secondary | ICD-10-CM

## 2020-04-30 DIAGNOSIS — Z8679 Personal history of other diseases of the circulatory system: Secondary | ICD-10-CM

## 2020-04-30 DIAGNOSIS — Z3A24 24 weeks gestation of pregnancy: Secondary | ICD-10-CM

## 2020-04-30 DIAGNOSIS — O99210 Obesity complicating pregnancy, unspecified trimester: Secondary | ICD-10-CM

## 2020-04-30 DIAGNOSIS — O24419 Gestational diabetes mellitus in pregnancy, unspecified control: Secondary | ICD-10-CM

## 2020-04-30 LAB — POCT URINALYSIS DIPSTICK OB
Bilirubin, UA: NEGATIVE
Blood, UA: NEGATIVE
Glucose, UA: NEGATIVE
Ketones, UA: NEGATIVE
Leukocytes, UA: NEGATIVE
Nitrite, UA: NEGATIVE
POC,PROTEIN,UA: NEGATIVE
Spec Grav, UA: 1.015 (ref 1.010–1.025)
Urobilinogen, UA: 0.2 E.U./dL
pH, UA: 5 (ref 5.0–8.0)

## 2020-04-30 NOTE — Patient Instructions (Signed)
Gestational Diabetes Mellitus, Self-Care Caring for yourself after a diagnosis of gestational diabetes mellitus means keeping your blood sugar under control. This can be done through nutrition, exercise, lifestyle changes, insulin and other medicines, and support from your health care team. Your health care provider will set individualized treatment goals for you. What are the risks? If left untreated, gestational diabetes can cause problems for mother and baby. For the mother Women who get gestational diabetes are more likely to:  Have labor induced and deliver early.  Have problems during labor and delivery, if the baby is larger than normal. This includes difficult labor and damage to the birth canal.  Have a cesarean delivery.  Have problems with blood pressure, including high blood pressure and preeclampsia.  Get it again if they become pregnant.  Develop type 2 diabetes in the future. For the baby Gestational diabetes that is not treated can cause the baby to have:  Low blood glucose (hypoglycemia).  Larger-than-normal body size (macrosomia).  Breathing problems. How to monitor blood glucose Check your blood glucose every day and as often as told by your health care provider. To do this: 1. Wash your hands with soap and water for at least 20 seconds. 2. Prick the side of your finger (not the tip) with the lancet. Use a different finger each time. 3. Gently rub the finger until a small drop of blood appears. 4. Follow instructions that come with your meter for inserting the test strip, applying blood to the strip, and getting the result. 5. Write down your result and any notes. Blood glucose goals are:  95 mg/dL (5.3 mmol/L) when fasting.  140 mg/dL (7.8 mmol/L) 1 hour after a meal.  120 mg/dL (6.7 mmol/L) 2 hours after a meal.   Follow these instructions at home: Medicines  Take over-the-counter and prescription medicines only as told by your health care  provider.  If your health care provider prescribed insulin or other diabetes medicines: ? Take them every day. ? Do not run out of insulin or other medicines. Plan ahead so you always have them available. Eating and drinking  Follow instructions from your health care provider about eating or drinking restrictions.  See a diet and nutrition expert (registered dietician) to help you create an eating plan that helps control your diabetes. The foods in this plan will include: ? Lean proteins. ? Complex carbohydrates. These are carbohydrates that contain fiber, have a lot of nutrients, and are digested slowly. They include dried beans, nuts, and whole grain breads, cereals, or pasta. ? Fresh fruits and vegetables. ? Low-fat dairy products. ? Healthy fats.  Eat healthy snacks between nutritious meals.  Drink enough fluid to keep your urine pale yellow.  Keep a record of the carbohydrates that you eat. To do this: ? Read food labels. ? Learn the standard serving sizes of foods.  Make a sick day plan with your health care provider before you get sick. Follow this plan whenever you cannot eat or drink as usual.   Activity  Do exercises as told by your health care provider.  Do 30 or more minutes of physical activity a day, or as much physical activity as your health care provider recommends. It may help to control blood glucose levels after a meal if you: ? Do 10 minutes of exercise after each meal. ? Start this exercise 30 minutes after the meal.  If you start a new exercise or activity, work with your health care provider to adjust your  insulin, other medicines, or food as needed. Lifestyle  Do not drink alcohol.  Do not use any products that contain nicotine or tobacco, such as cigarettes, e-cigarettes, and chewing tobacco. If you need help quitting, ask your health care provider.  Learn to manage stress. If you need help with this, ask your health care provider. Body care  Keep  your vaccines up to date.  Practice good oral hygiene. To do this: ? Clean your teeth and gums two times a day. ? Floss one or more times a day. ? Visit your dentist one or more times every 6 months.  Stay at a healthy weight while you are pregnant. Your expected weight gain depends on your BMI (body mass index) before pregnancy. General instructions  Talk with your health care provider about the risk for high blood pressure during pregnancy (preeclampsia and eclampsia).  Share your diabetes management plan with people in your workplace, school, and household.  Check your urine for ketones when sick and as told by your health care provider. Ketones are made by the liver when a lack of glucose forces the body to use fat for energy.  Carry a medical alert card or wear medical alert jewelry that says you have gestational diabetes.  Keep all follow-up visits. This is important. Get care after delivery  Have your blood glucose level checked with an oral glucose tolerance test (OGTT) 4-12 weeks after delivery.  Get screened for diabetes at least every 3 years, or as often as told by your health care provider. Where to find more information  American Diabetes Association (ADA): diabetes.org  Association of Diabetes Care & Education Specialists (ADCES): diabeteseducator.org  Centers for Disease Control and Prevention (CDC): TonerPromos.no  American Pregnancy Association: americanpregnancy.org  [place] Department of Agriculture MyPlate: WrestlingReporter.dk Contact a health care provider if:  Your blood glucose is above your target for two tests in a row.  You have a fever.  You have been sick for 2 days or more and are not getting better.  You have either of these problems for more than 6 hours: ? Vomiting every time you eat or drink. ? Diarrhea. Get help right away if you:  Become confused or cannot think clearly.  Have trouble breathing.  Have moderate or high ketones in your  urine.  Feel your baby is not moving as usual.  Develop unusual discharge or bleeding from your vagina.  Start having early (premature) contractions. Contractions may feel like a tightening in your lower abdomen  Have a severe headache. These symptoms may represent a serious problem that is an emergency. Do not wait to see if the symptoms will go away. Get medical help right away. Call your local emergency services (911 in the [place] Do not drive yourself to the hospital. Summary  Check your blood glucose every day during your pregnancy. Do this as often as told by your health care provider.  Take insulin or other diabetes medicines every day, if your health care provider prescribed them.  Have your blood glucose level checked 4-12 weeks after delivery.  Keep all follow-up visits. This is important. This information is not intended to replace advice given to you by your health care provider. Make sure you discuss any questions you have with your health care provider. Document Revised: 08/19/2019 Document Reviewed: 08/19/2019 Elsevier Patient Education  2021 Elsevier Inc.   Second Trimester of Pregnancy  The second trimester of pregnancy is from week 13 through week 27. This is also called months  4 through 6 of pregnancy. This is often the time when you feel your best. During the second trimester:  Morning sickness is less or has stopped.  You may have more energy.  You may feel hungry more often. At this time, your unborn baby (fetus) is growing very fast. At the end of the sixth month, the unborn baby may be up to 12 inches long and weigh about 1 pounds. You will likely start to feel the baby move between 16 and 20 weeks of pregnancy. Body changes during your second trimester Your body continues to go through many changes during this time. The changes vary and generally return to normal after the baby is born. Physical changes  You will gain more weight.  You may start to  get stretch marks on your hips, belly (abdomen), and breasts.  Your breasts will grow and may hurt.  Dark spots or blotches may develop on your face.  A dark line from your belly button to the pubic area (linea nigra) may appear.  You may have changes in your hair. Health changes  You may have headaches.  You may have heartburn.  You may have trouble pooping (constipation).  You may have hemorrhoids or swollen, bulging veins (varicose veins).  Your gums may bleed.  You may pee (urinate) more often.  You may have back pain. Follow these instructions at home: Medicines  Take over-the-counter and prescription medicines only as told by your doctor. Some medicines are not safe during pregnancy.  Take a prenatal vitamin that contains at least 600 micrograms (mcg) of folic acid. Eating and drinking  Eat healthy meals that include: ? Fresh fruits and vegetables. ? Whole grains. ? Good sources of protein, such as meat, eggs, or tofu. ? Low-fat dairy products.  Avoid raw meat and unpasteurized juice, milk, and cheese.  You may need to take these actions to prevent or treat trouble pooping: ? Drink enough fluids to keep your pee (urine) pale yellow. ? Eat foods that are high in fiber. These include beans, whole grains, and fresh fruits and vegetables. ? Limit foods that are high in fat and sugar. These include fried or sweet foods. Activity  Exercise only as told by your doctor. Most people can do their usual exercise during pregnancy. Try to exercise for 30 minutes at least 5 days a week.  Stop exercising if you have pain or cramps in your belly or lower back.  Do not exercise if it is too hot or too humid, or if you are in a place of great height (high altitude).  Avoid heavy lifting.  If you choose to, you may have sex unless your doctor tells you not to. Relieving pain and discomfort  Wear a good support bra if your breasts are sore.  Take warm water baths (sitz  baths) to soothe pain or discomfort caused by hemorrhoids. Use hemorrhoid cream if your doctor approves.  Rest with your legs raised (elevated) if you have leg cramps or low back pain.  If you develop bulging veins in your legs: ? Wear support hose as told by your doctor. ? Raise your feet for 15 minutes, 3-4 times a day. ? Limit salt in your food. Safety  Wear your seat belt at all times when you are in a car.  Talk with your doctor if someone is hurting you or yelling at you a lot. Lifestyle  Do not use hot tubs, steam rooms, or saunas.  Do not douche. Do not  use tampons or scented sanitary pads.  Avoid cat litter boxes and soil used by cats. These carry germs that can harm your baby and can cause a loss of your baby by miscarriage or stillbirth.  Do not use herbal medicines, illegal drugs, or medicines that are not approved by your doctor. Do not drink alcohol.  Do not smoke or use any products that contain nicotine or tobacco. If you need help quitting, ask your doctor. General instructions  Keep all follow-up visits. This is important.  Ask your doctor about local prenatal classes.  Ask your doctor about the right foods to eat or for help finding a counselor. Where to find more information  American Pregnancy Association: americanpregnancy.org  [employer] and [employer]necologists: www.acog.org  Office on [employer] Health: MightyReward.co.nz Contact a doctor if:  You have a headache that does not go away when you take medicine.  You have changes in how you see, or you see spots in front of your eyes.  You have mild cramps, pressure, or pain in your lower belly.  You continue to feel like you may vomit (nauseous), you vomit, or you have watery poop (diarrhea).  You have bad-smelling fluid coming from your vagina.  You have pain when you pee or your pee smells bad.  You have very bad swelling of your face, hands, ankles, feet, or  legs.  You have a fever. Get help right away if:  You are leaking fluid from your vagina.  You have spotting or bleeding from your vagina.  You have very bad belly cramping or pain.  You have trouble breathing.  You have chest pain.  You faint.  You have not felt your baby move for the time period told by your doctor.  You have new or increased pain, swelling, or redness in an arm or leg. Summary  The second trimester of pregnancy is from week 13 through week 27 (months 4 through 6).  Eat healthy meals.  Exercise as told by your doctor. Most people can do their usual exercise during pregnancy.  Do not use herbal medicines, illegal drugs, or medicines that are not approved by your doctor. Do not drink alcohol.  Call your doctor if you get sick or if you notice anything unusual about your pregnancy. This information is not intended to replace advice given to you by your health care provider. Make sure you discuss any questions you have with your health care provider. Document Revised: 08/21/2019 Document Reviewed: 06/27/2019 Elsevier Patient Education  2021 [employer].

## 2020-04-30 NOTE — Progress Notes (Signed)
ROB-Reports round ligament pain and occasional leg swelling. Discussed home treatment measures. Did not bring blood sugar log to today's visit, advised to send via MyChart. States dietician scanned one (1) into chart last week. Anticipatory guidance regarding course of prenatal care. Reviewed red flag symptoms and when to call. RTC x 4 weeks for growth ultrasound, CBC/RPR, and ROB or sooner if needed.

## 2020-05-04 ENCOUNTER — Telehealth: Payer: Self-pay

## 2020-05-04 NOTE — Telephone Encounter (Signed)
Contacted patient per Ailene Ravel request. Explained the need for transfer to MDs. All questions answered.

## 2020-05-27 ENCOUNTER — Other Ambulatory Visit: Payer: Self-pay | Admitting: Certified Nurse Midwife

## 2020-05-27 ENCOUNTER — Telehealth: Payer: Self-pay

## 2020-05-27 DIAGNOSIS — O0992 Supervision of high risk pregnancy, unspecified, second trimester: Secondary | ICD-10-CM

## 2020-05-27 DIAGNOSIS — O99210 Obesity complicating pregnancy, unspecified trimester: Secondary | ICD-10-CM

## 2020-05-27 DIAGNOSIS — Z3A24 24 weeks gestation of pregnancy: Secondary | ICD-10-CM

## 2020-05-27 DIAGNOSIS — O24419 Gestational diabetes mellitus in pregnancy, unspecified control: Secondary | ICD-10-CM

## 2020-05-27 DIAGNOSIS — Z8679 Personal history of other diseases of the circulatory system: Secondary | ICD-10-CM

## 2020-05-27 NOTE — Telephone Encounter (Signed)
Pt is high risk OB  Per Selena Batten - scan will need to be done at [facility]. KIm to make appt and f/u with pt.   PB-took message.

## 2020-05-28 ENCOUNTER — Encounter: Payer: Self-pay | Admitting: Obstetrics and Gynecology

## 2020-05-28 ENCOUNTER — Other Ambulatory Visit: Payer: Medicaid Other

## 2020-05-28 ENCOUNTER — Ambulatory Visit (INDEPENDENT_AMBULATORY_CARE_PROVIDER_SITE_OTHER): Payer: Medicaid Other | Admitting: Obstetrics and Gynecology

## 2020-05-28 ENCOUNTER — Other Ambulatory Visit: Payer: Self-pay

## 2020-05-28 ENCOUNTER — Ambulatory Visit: Admission: RE | Admit: 2020-05-28 | Payer: Medicaid Other | Source: Ambulatory Visit

## 2020-05-28 ENCOUNTER — Encounter: Payer: Medicaid Other | Admitting: Certified Nurse Midwife

## 2020-05-28 VITALS — BP 148/88 | HR 91 | Ht 66.0 in | Wt 258.6 lb

## 2020-05-28 DIAGNOSIS — O0993 Supervision of high risk pregnancy, unspecified, third trimester: Secondary | ICD-10-CM

## 2020-05-28 DIAGNOSIS — O99213 Obesity complicating pregnancy, third trimester: Secondary | ICD-10-CM

## 2020-05-28 DIAGNOSIS — Z23 Encounter for immunization: Secondary | ICD-10-CM

## 2020-05-28 DIAGNOSIS — O24410 Gestational diabetes mellitus in pregnancy, diet controlled: Secondary | ICD-10-CM

## 2020-05-28 DIAGNOSIS — O10013 Pre-existing essential hypertension complicating pregnancy, third trimester: Secondary | ICD-10-CM

## 2020-05-28 DIAGNOSIS — Z3403 Encounter for supervision of normal first pregnancy, third trimester: Secondary | ICD-10-CM

## 2020-05-28 DIAGNOSIS — Z3A28 28 weeks gestation of pregnancy: Secondary | ICD-10-CM

## 2020-05-28 LAB — POCT URINALYSIS DIPSTICK OB
Bilirubin, UA: NEGATIVE
Blood, UA: NEGATIVE
Glucose, UA: NEGATIVE
Ketones, UA: NEGATIVE
Leukocytes, UA: NEGATIVE
Nitrite, UA: NEGATIVE
POC,PROTEIN,UA: NEGATIVE
Spec Grav, UA: 1.01 (ref 1.010–1.025)
Urobilinogen, UA: 0.2 E.U./dL
pH, UA: 6.5 (ref 5.0–8.0)

## 2020-05-28 MED ORDER — TETANUS-DIPHTH-ACELL PERTUSSIS 5-2.5-18.5 LF-MCG/0.5 IM SUSY
0.5000 mL | PREFILLED_SYRINGE | Freq: Once | INTRAMUSCULAR | Status: AC
  Administered 2020-05-28: 0.5 mL via INTRAMUSCULAR

## 2020-05-28 NOTE — Progress Notes (Signed)
OB-Pt present for routine prenatal care. Pt stated having lower back pain, lower abd pain, hip pain, braxton hick contractions and nose bleeds.

## 2020-05-28 NOTE — Progress Notes (Signed)
Transfer OB: Patient from midwifery care due to cHTN (on Labetalol), PVCs, h/o heart murmur obesity in pregnancy, GDM (diet controlled, dx at [redacted] weeks gestation).  Notes that she is taking HTN medications and aspirin as prescribed.  She has complaints of lower abdominal and hip pain. Now wearing a belly band which is helping. Also reports [employer].  Occasionally noting nosebleeds, may be due to pregnancy or possibly allergies. Can utilize nasal saline spray to keep nasal passages moist. Reports concern as her ultrasound was pushed out 3-4 more weeks (was originally scheduled for today). Will work to reschedule for sooner date.  Blood sugars reviewed. An occasional elevation noted postprandial lunch (x 1) and dinner (x 1).  For 28 week labs today.  Desires to breastfeed (see discussion below),  desires unsure method for contraception. For Tdap today, signed blood consent, discussed delayed cord clamping.  To begin antenatal testing at 32 weeks.    The following were addressed during this visit:  Breastfeeding Education - Early initiation of breastfeeding    Comments: Keeps milk supply adequate, helps contract uterus and slow bleeding, and early milk is the perfect first food and is easy to digest.   - The importance of exclusive breastfeeding    Comments: Provides antibodies, Lower risk of breast and ovarian cancers, and type-2 diabetes,Helps your body recover, Reduced chance of SIDS.   - Risks of giving your baby anything other than breast milk if you are breastfeeding    Comments: Make the baby less content with breastfeeds, may make my baby more susceptible to illness, and may reduce my milk supply.   - Nonpharmacological pain relief methods for labor    Comments: Deep breathing, focusing on pleasant things, movement and walking, heating pads or cold compress, massage and relaxation, continuous support from someone you trust, and Doulas   - The importance of early skin-to-skin contact     Comments: Keeps baby warm and secure, helps keep baby's blood sugar up and breathing steady, easier to bond and breastfeed, and helps calm baby.  - Rooming-in on a 24-hour basis    Comments: Easier to learn baby's feeding cues, easier to bond and get to know each other, and encourages milk production.   - Feeding on demand or baby-led feeding    Comments: Helps prevent breastfeeding complications, helps bring in good milk supply, prevents under or overfeeding, and helps baby feel content and satisfied   - Frequent feeding to help assure optimal milk production    Comments: Making a full supply of milk requires frequent removal of milk from breasts, infant will eat 8-12 times in 24 hours, if separated from infant use breast massage, hand expression and/ or pumping to remove milk from breasts.   - Effective positioning and attachment    Comments: Helps my baby to get enough breast milk, helps to produce an adequate milk supply, and helps prevent nipple pain and damage   - Exclusive breastfeeding for the first 6 months    Comments: Builds a healthy milk supply and keeps it up, protects baby from sickness and disease, and breastmilk has everything your baby needs for the first 6 months.

## 2020-05-28 NOTE — Patient Instructions (Signed)
WHAT OB PATIENTS CAN EXPECT   Confirmation of pregnancy and ultrasound ordered if medically indicated-[redacted] weeks gestation  New OB (NOB) intake with nurse and New OB (NOB) labs- [redacted] weeks gestation  New OB (NOB) physical examination with provider- 11/[redacted] weeks gestation  Flu vaccine-[redacted] weeks gestation  Anatomy scan-[redacted] weeks gestation  Glucose tolerance test, blood work to test for anemia, T-dap vaccine-[redacted] weeks gestation  Vaginal swabs/cultures-STD/Group B strep-[redacted] weeks gestation  Appointments every 4 weeks until 28 weeks  Every 2 weeks from 28 weeks until 36 weeks  Weekly visits from 82 weeks until delivery  Third Trimester of Pregnancy  The third trimester of pregnancy is from week 28 through week 55. This is also called months 7 through 9. This trimester is when your unborn baby (fetus) is growing very fast. At the end of the ninth month, the unborn baby is about 20 inches long. It weighs about 6-10 pounds. Body changes during your third trimester Your body continues to go through many changes during this time. The changes vary and generally return to normal after the baby is born. Physical changes  Your weight will continue to increase. You may gain 25-35 pounds (11-16 kg) by the end of the pregnancy. If you are underweight, you may gain 28-40 lb (about 13-18 kg). If you are overweight, you may gain 15-25 lb (about 7-11 kg).  You may start to get stretch marks on your hips, belly (abdomen), and breasts.  Your breasts will continue to grow and may hurt. A yellow fluid (colostrum) may leak from your breasts. This is the first milk you are making for your baby.  You may have changes in your hair.  Your belly button may stick out.  You may have more swelling in your hands, face, or ankles. Health changes  You may have heartburn.  You may have trouble pooping (constipation).  You may get hemorrhoids. These are swollen veins in the butt that can itch or get painful.  You may  have swollen veins (varicose veins) in your legs.  You may have more body aches in the pelvis, back, or thighs.  You may have more tingling or numbness in your hands, arms, and legs. The skin on your belly may also feel numb.  You may feel short of breath as your womb (uterus) gets bigger. Other changes  You may pee (urinate) more often.  You may have more problems sleeping.  You may notice the unborn baby "dropping," or moving lower in your belly.  You may have more discharge coming from your vagina.  Your joints may feel loose, and you may have pain around your pelvic bone. Follow these instructions at home: Medicines  Take over-the-counter and prescription medicines only as told by your doctor. Some medicines are not safe during pregnancy.  Take a prenatal vitamin that contains at least 600 micrograms (mcg) of folic acid. Eating and drinking  Eat healthy meals that include: ? Fresh fruits and vegetables. ? Whole grains. ? Good sources of protein, such as meat, eggs, or tofu. ? Low-fat dairy products.  Avoid raw meat and unpasteurized juice, milk, and cheese. These carry germs that can harm you and your baby.  Eat 4 or 5 small meals rather than 3 large meals a day.  You may need to take these actions to prevent or treat trouble pooping: ? Drink enough fluids to keep your pee (urine) pale yellow. ? Eat foods that are high in fiber. These include beans, whole grains, and  fresh fruits and vegetables. ? Limit foods that are high in fat and sugar. These include fried or sweet foods. Activity  Exercise only as told by your doctor. Stop exercising if you start to have cramps in your womb.  Avoid heavy lifting.  Do not exercise if it is too hot or too humid, or if you are in a place of great height (high altitude).  If you choose to, you may have sex unless your doctor tells you not to. Relieving pain and discomfort  Take breaks often, and rest with your legs raised  (elevated) if you have leg cramps or low back pain.  Take warm water baths (sitz baths) to soothe pain or discomfort caused by hemorrhoids. Use hemorrhoid cream if your doctor approves.  Wear a good support bra if your breasts are tender.  If you develop bulging, swollen veins in your legs: ? Wear support hose as told by your doctor. ? Raise your feet for 15 minutes, 3-4 times a day. ? Limit salt in your food. Safety  Talk to your doctor before traveling far distances.  Do not use hot tubs, steam rooms, or saunas.  Wear your seat belt at all times when you are in a car.  Talk with your doctor if someone is hurting you or yelling at you a lot. Preparing for your baby's arrival To prepare for the arrival of your baby:  Take prenatal classes.  Visit the hospital and tour the maternity area.  Buy a rear-facing car seat. Learn how to install it in your car.  Prepare the baby's room. Take out all pillows and stuffed animals from the baby's crib. General instructions  Avoid cat litter boxes and soil used by cats. These carry germs that can cause harm to the baby and can cause a loss of your baby by miscarriage or stillbirth.  Do not douche or use tampons. Do not use scented sanitary pads.  Do not smoke or use any products that contain nicotine or tobacco. If you need help quitting, ask your doctor.  Do not drink alcohol.  Do not use herbal medicines, illegal drugs, or medicines that were not approved by your doctor. Chemicals in these products can affect your baby.  Keep all follow-up visits. This is important. Where to find more information  American Pregnancy Association: americanpregnancy.org  [employer]: www.acog.org  Office on Women's Health: KeywordPortfolios.com.br Contact a doctor if:  You have a fever.  You have mild cramps or pressure in your lower belly.  You have a nagging pain in your belly area.  You vomit, or  you have watery poop (diarrhea).  You have bad-smelling fluid coming from your vagina.  You have pain when you pee, or your pee smells bad.  You have a headache that does not go away when you take medicine.  You have changes in how you see, or you see spots in front of your eyes. Get help right away if:  Your water breaks.  You have regular contractions that are less than 5 minutes apart.  You are spotting or bleeding from your vagina.  You have very bad belly cramps or pain.  You have trouble breathing.  You have chest pain.  You faint.  You have not felt the baby move for the amount of time told by your doctor.  You have new or increased pain, swelling, or redness in an arm or leg. Summary  The third trimester is from week 28 through  week 40 (months 7 through 9). This is the time when your unborn baby is growing very fast.  During this time, your discomfort may increase as you gain weight and as your baby grows.  Get ready for your baby to arrive by taking prenatal classes, buying a rear-facing car seat, and preparing the baby's room.  Get help right away if you are bleeding from your vagina, you have chest pain and trouble breathing, or you have not felt the baby move for the amount of time told by your doctor. This information is not intended to replace advice given to you by your health care provider. Make sure you discuss any questions you have with your health care provider. Document Revised: 08/21/2019 Document Reviewed: 06/27/2019 Elsevier Patient Education  [place]. Common Medications Safe in Pregnancy  Acne:      Constipation:  Benzoyl Peroxide     Colace  Clindamycin      Dulcolax Suppository  Topica Erythromycin     Fibercon  Salicylic Acid      Metamucil         Miralax AVOID:        Senakot   Accutane    Cough:  Retin-A       Cough Drops  Tetracycline      Phenergan w/ Codeine if Rx  Minocycline      Robitussin (Plain &  DM)  Antibiotics:     Crabs/Lice:  Ceclor       RID  Cephalosporins    AVOID:  E-Mycins      Kwell  Keflex  Macrobid/Macrodantin   Diarrhea:  Penicillin      Kao-Pectate  Zithromax      Imodium AD         PUSH FLUIDS AVOID:       Cipro     Fever:  Tetracycline      Tylenol (Regular or Extra  Minocycline       Strength)  Levaquin      Extra Strength-Do not          Exceed 8 tabs/24 hrs Caffeine:        <276m/day (equiv. To 1 cup of coffee or  approx. 3 12 oz sodas)         Gas: Cold/Hayfever:       Gas-X  Benadryl      Mylicon  Claritin       Phazyme  **Claritin-D        Chlor-Trimeton    Headaches:  Dimetapp      ASA-Free Excedrin  Drixoral-Non-Drowsy     Cold Compress  Mucinex (Guaifenasin)     Tylenol (Regular or Extra  Sudafed/Sudafed-12 Hour     Strength)  **Sudafed PE Pseudoephedrine   Tylenol Cold & Sinus     Vicks Vapor Rub  Zyrtec  **AVOID if Problems With Blood Pressure         Heartburn: Avoid lying down for at least 1 hour after meals  Aciphex      Maalox     Rash:  Milk of Magnesia     Benadryl    Mylanta       1% Hydrocortisone Cream  Pepcid  Pepcid Complete   Sleep Aids:  Prevacid      Ambien   Prilosec       Benadryl  Rolaids       Chamomile Tea  Tums (Limit 4/day)     Unisom  Tylenol PM         Warm milk-add vanilla or  Hemorrhoids:       Sugar for taste  Anusol/Anusol H.C.  (RX: Analapram 2.5%)  Sugar Substitutes:  Hydrocortisone OTC     Ok in moderation  Preparation H      Tucks        Vaseline lotion applied to tissue with wiping    Herpes:     Throat:  Acyclovir      Oragel  Famvir  Valtrex     Vaccines:         Flu Shot Leg Cramps:       *Gardasil  Benadryl      Hepatitis A         Hepatitis B Nasal Spray:       Pneumovax  Saline Nasal Spray     Polio Booster         Tetanus Nausea:       Tuberculosis test or PPD  Vitamin B6 25 mg TID   AVOID:    Dramamine      *Gardasil  Emetrol       Live Poliovirus  Ginger  Root 250 mg QID    MMR (measles, mumps &  High Complex Carbs @ Bedtime    rebella)  Sea Bands-Accupressure    Varicella (Chickenpox)  Unisom 1/2 tab TID     *No known complications           If received before Pain:         Known pregnancy;   Darvocet       Resume series after  Lortab        Delivery  Percocet    Yeast:   Tramadol      Femstat  Tylenol 3      Gyne-lotrimin  Ultram       Monistat  Vicodin           MISC:         All Sunscreens           Hair Coloring/highlights          Insect Repellant's          (Including DEET)         Mystic Tans  

## 2020-05-29 LAB — CBC
Hematocrit: 36.5 % (ref 34.0–46.6)
Hemoglobin: 12.4 g/dL (ref 11.1–15.9)
MCH: 28.6 pg (ref 26.6–33.0)
MCHC: 34 g/dL (ref 31.5–35.7)
MCV: 84 fL (ref 79–97)
Platelets: 224 10*3/uL (ref 150–450)
RBC: 4.34 x10E6/uL (ref 3.77–5.28)
RDW: 13.1 % (ref 11.7–15.4)
WBC: 7.5 10*3/uL (ref 3.4–10.8)

## 2020-05-29 LAB — RPR: RPR Ser Ql: NONREACTIVE

## 2020-05-30 ENCOUNTER — Encounter: Payer: Self-pay | Admitting: Obstetrics and Gynecology

## 2020-06-02 ENCOUNTER — Ambulatory Visit: Payer: Medicaid Other | Attending: Obstetrics and Gynecology

## 2020-06-02 ENCOUNTER — Other Ambulatory Visit: Payer: Self-pay

## 2020-06-02 DIAGNOSIS — O24410 Gestational diabetes mellitus in pregnancy, diet controlled: Secondary | ICD-10-CM | POA: Insufficient documentation

## 2020-06-02 DIAGNOSIS — O10013 Pre-existing essential hypertension complicating pregnancy, third trimester: Secondary | ICD-10-CM | POA: Diagnosis not present

## 2020-06-02 DIAGNOSIS — Z3A29 29 weeks gestation of pregnancy: Secondary | ICD-10-CM | POA: Insufficient documentation

## 2020-06-02 DIAGNOSIS — O99210 Obesity complicating pregnancy, unspecified trimester: Secondary | ICD-10-CM

## 2020-06-02 DIAGNOSIS — O10912 Unspecified pre-existing hypertension complicating pregnancy, second trimester: Secondary | ICD-10-CM | POA: Diagnosis not present

## 2020-06-02 DIAGNOSIS — O0992 Supervision of high risk pregnancy, unspecified, second trimester: Secondary | ICD-10-CM

## 2020-06-02 DIAGNOSIS — Z3A24 24 weeks gestation of pregnancy: Secondary | ICD-10-CM | POA: Diagnosis not present

## 2020-06-02 DIAGNOSIS — O99213 Obesity complicating pregnancy, third trimester: Secondary | ICD-10-CM | POA: Diagnosis not present

## 2020-06-02 DIAGNOSIS — Z8679 Personal history of other diseases of the circulatory system: Secondary | ICD-10-CM

## 2020-06-02 DIAGNOSIS — O0993 Supervision of high risk pregnancy, unspecified, third trimester: Secondary | ICD-10-CM | POA: Insufficient documentation

## 2020-06-02 DIAGNOSIS — O24419 Gestational diabetes mellitus in pregnancy, unspecified control: Secondary | ICD-10-CM

## 2020-06-09 ENCOUNTER — Encounter: Payer: Self-pay | Admitting: Obstetrics and Gynecology

## 2020-06-09 ENCOUNTER — Ambulatory Visit (INDEPENDENT_AMBULATORY_CARE_PROVIDER_SITE_OTHER): Payer: Medicaid Other | Admitting: Obstetrics and Gynecology

## 2020-06-09 ENCOUNTER — Other Ambulatory Visit: Payer: Self-pay

## 2020-06-09 VITALS — BP 107/81 | HR 93 | Wt 257.6 lb

## 2020-06-09 DIAGNOSIS — Z3A30 30 weeks gestation of pregnancy: Secondary | ICD-10-CM

## 2020-06-09 DIAGNOSIS — Z3403 Encounter for supervision of normal first pregnancy, third trimester: Secondary | ICD-10-CM

## 2020-06-09 LAB — POCT URINALYSIS DIPSTICK OB
Bilirubin, UA: NEGATIVE
Glucose, UA: NEGATIVE
Ketones, UA: NEGATIVE
Leukocytes, UA: NEGATIVE
Nitrite, UA: NEGATIVE
POC,PROTEIN,UA: NEGATIVE
Spec Grav, UA: 1.01
Urobilinogen, UA: 0.2 U/dL
pH, UA: 6

## 2020-06-09 NOTE — Progress Notes (Signed)
ROB:   1.  Chronic hypertension: -Currently controlled with labetalol 200 twice daily  2.  Head sparing IUGR: 13th percentile growth femur length 2nd percentile -follow-up ultrasound scheduled with MFM 3/29  3.  Gestational diabetes: Currently diet controlled-move from 4 times daily to twice daily as directed  4.  Cardiology consult with echo noted: -Nothing further during this pregnancy -follow-up postpartum  5.  Discussed early delivery during 37th week based on chronic hypertension and possible IUGR  6.  Begin NSTs weekly at 32 weeks -scheduled for next visit

## 2020-06-13 ENCOUNTER — Other Ambulatory Visit: Payer: Self-pay | Admitting: Certified Nurse Midwife

## 2020-06-13 DIAGNOSIS — O24419 Gestational diabetes mellitus in pregnancy, unspecified control: Secondary | ICD-10-CM

## 2020-06-13 DIAGNOSIS — O99212 Obesity complicating pregnancy, second trimester: Secondary | ICD-10-CM

## 2020-06-13 DIAGNOSIS — O10912 Unspecified pre-existing hypertension complicating pregnancy, second trimester: Secondary | ICD-10-CM

## 2020-06-18 ENCOUNTER — Ambulatory Visit: Payer: Medicaid Other

## 2020-06-19 ENCOUNTER — Other Ambulatory Visit: Payer: Self-pay

## 2020-06-19 ENCOUNTER — Observation Stay
Admission: EM | Admit: 2020-06-19 | Discharge: 2020-06-19 | Disposition: A | Discharge status: Home / Self Care | Payer: Medicaid Other | Attending: Obstetrics and Gynecology | Admitting: Obstetrics and Gynecology

## 2020-06-19 ENCOUNTER — Encounter: Payer: Self-pay | Admitting: Obstetrics and Gynecology

## 2020-06-19 DIAGNOSIS — Z79899 Other long term (current) drug therapy: Secondary | ICD-10-CM | POA: Insufficient documentation

## 2020-06-19 DIAGNOSIS — R109 Unspecified abdominal pain: Secondary | ICD-10-CM | POA: Diagnosis not present

## 2020-06-19 DIAGNOSIS — O10912 Unspecified pre-existing hypertension complicating pregnancy, second trimester: Secondary | ICD-10-CM | POA: Diagnosis present

## 2020-06-19 DIAGNOSIS — O24410 Gestational diabetes mellitus in pregnancy, diet controlled: Secondary | ICD-10-CM | POA: Insufficient documentation

## 2020-06-19 DIAGNOSIS — O0993 Supervision of high risk pregnancy, unspecified, third trimester: Secondary | ICD-10-CM

## 2020-06-19 DIAGNOSIS — O23593 Infection of other part of genital tract in pregnancy, third trimester: Principal | ICD-10-CM | POA: Insufficient documentation

## 2020-06-19 DIAGNOSIS — Z3A31 31 weeks gestation of pregnancy: Secondary | ICD-10-CM | POA: Diagnosis not present

## 2020-06-19 DIAGNOSIS — I10 Essential (primary) hypertension: Secondary | ICD-10-CM | POA: Diagnosis present

## 2020-06-19 DIAGNOSIS — Z3490 Encounter for supervision of normal pregnancy, unspecified, unspecified trimester: Secondary | ICD-10-CM

## 2020-06-19 DIAGNOSIS — O133 Gestational [pregnancy-induced] hypertension without significant proteinuria, third trimester: Secondary | ICD-10-CM | POA: Diagnosis not present

## 2020-06-19 DIAGNOSIS — O26893 Other specified pregnancy related conditions, third trimester: Secondary | ICD-10-CM

## 2020-06-19 DIAGNOSIS — O24419 Gestational diabetes mellitus in pregnancy, unspecified control: Secondary | ICD-10-CM | POA: Diagnosis present

## 2020-06-19 LAB — URINALYSIS, ROUTINE W REFLEX MICROSCOPIC
Bilirubin Urine: NEGATIVE
Glucose, UA: >=500 mg/dL — AB
Ketones, ur: NEGATIVE mg/dL
Leukocytes,Ua: NEGATIVE
Nitrite: NEGATIVE
Protein, ur: NEGATIVE mg/dL
Specific Gravity, Urine: 1.021 (ref 1.005–1.030)
pH: 5 (ref 5.0–8.0)

## 2020-06-19 LAB — WET PREP, GENITAL
Sperm: NONE SEEN
Trich, Wet Prep: NONE SEEN
Yeast Wet Prep HPF POC: NONE SEEN

## 2020-06-19 NOTE — Final Progress Note (Addendum)
L&D OB Triage Note  Stacy Soto is a 20 y.o. G1P0000 female at [redacted]w[redacted]d, EDD Estimated Date of Delivery: 08/15/20 who presented to triage for complaints of an increase in vaginal discharge and abdominal pain.She has a h/o GDM (diet) and cHTN on Labetalol 200 mg. Vaginal discharge was also reported to have a light pink tinge. Pain is 6/10 on pain scale.  Reports that she took a hot shower which briefly helped her pain. Denies urinary symptoms.  She reports concern as her husband was born prematurely around the gestation she currently is, and was worried that she may be experiencing preterm labor or dilation. She was evaluated by the nurses with no significant findings. Vital signs stable. An NST was performed and has been reviewed by MD. She was treated with PO fluids, Tylenol.     Vitals:   06/19/20 1916 06/19/20 1920 06/19/20 1921  BP: (!) 159/87 134/72 134/72  Pulse:  97 95  Resp:  16   Temp:  97.9 F (36.6 C)   TempSrc:  Oral   Weight:  113.4 kg   Height:  5\' 6"  (1.676 m)    Physical Exam deferred.    NST INTERPRETATION: Indications: rule out uterine contractions  Mode: External Baseline Rate (A): 135 bpm (fht) Variability: Moderate Accelerations: 15 x 15 Decelerations: None     Contraction Frequency (min): Occ  Impression: reactive    Labs:  Results for orders placed or performed during the hospital encounter of 06/19/20  Wet prep, genital  Result Value Ref Range   Yeast Wet Prep HPF POC NONE SEEN NONE SEEN   Trich, Wet Prep NONE SEEN NONE SEEN   Clue Cells Wet Prep HPF POC PRESENT (A) NONE SEEN   WBC, Wet Prep HPF POC MANY (A) NONE SEEN   Sperm NONE SEEN   Urinalysis, Routine w reflex microscopic  Result Value Ref Range   Color, Urine YELLOW (A) YELLOW   APPearance HAZY (A) CLEAR   Specific Gravity, Urine 1.021 1.005 - 1.030   pH 5.0 5.0 - 8.0   Glucose, UA >=500 (A) NEGATIVE mg/dL   Hgb urine dipstick MODERATE (A) NEGATIVE   Bilirubin Urine NEGATIVE NEGATIVE    Ketones, ur NEGATIVE NEGATIVE mg/dL   Protein, ur NEGATIVE NEGATIVE mg/dL   Nitrite NEGATIVE NEGATIVE   Leukocytes,Ua NEGATIVE NEGATIVE   RBC / HPF 0-5 0 - 5 RBC/hpf   WBC, UA 6-10 0 - 5 WBC/hpf   Bacteria, UA RARE (A) NONE SEEN   Squamous Epithelial / LPF 0-5 0 - 5   Mucus PRESENT     Plan: NST performed was reviewed and was found to be reactive. She was discharged home with bleeding/preterm labor precautions.  She will also receive a prescription for Flagyl to treat the BV infection. Continue routine prenatal care. Follow up with OB/GYN as previously scheduled.     06/21/20, MD  [facility]

## 2020-06-19 NOTE — OB Triage Note (Signed)
Pt is as G1P0 and [redacted]w[redacted]d presenting to L&D with c/o light pink tinge discharge around 1830 tonight and states she has had "abdominal tightening" on and off since this morning. Pt rates pain 6/10 on a 0-10 pain scale. Pt states she has not taken any medication for discomfort and she took a hot shower and it only helped while in the shower. Pt states fetal movement has not been as frequent as usual today. Pt denies LOF and VB. Monitors applied and assessing. VSS.

## 2020-06-19 NOTE — OB Triage Note (Signed)
Discharge instructions reviewed and pt verbalized understanding. MD placed prescription order to selected pt pharmacy. Pt stable at time of discharge. All questions answered.

## 2020-06-22 ENCOUNTER — Other Ambulatory Visit: Payer: Self-pay | Admitting: Certified Nurse Midwife

## 2020-06-22 DIAGNOSIS — O99213 Obesity complicating pregnancy, third trimester: Secondary | ICD-10-CM

## 2020-06-22 DIAGNOSIS — O10913 Unspecified pre-existing hypertension complicating pregnancy, third trimester: Secondary | ICD-10-CM

## 2020-06-22 DIAGNOSIS — O24419 Gestational diabetes mellitus in pregnancy, unspecified control: Secondary | ICD-10-CM

## 2020-06-23 ENCOUNTER — Ambulatory Visit: Payer: Medicaid Other | Attending: Maternal & Fetal Medicine

## 2020-06-23 ENCOUNTER — Other Ambulatory Visit: Payer: Medicaid Other

## 2020-06-23 ENCOUNTER — Ambulatory Visit (INDEPENDENT_AMBULATORY_CARE_PROVIDER_SITE_OTHER): Payer: Medicaid Other | Admitting: Obstetrics and Gynecology

## 2020-06-23 ENCOUNTER — Other Ambulatory Visit: Payer: Self-pay

## 2020-06-23 ENCOUNTER — Encounter: Payer: Self-pay | Admitting: Obstetrics and Gynecology

## 2020-06-23 VITALS — BP 129/84 | HR 97 | Wt 259.1 lb

## 2020-06-23 DIAGNOSIS — O10913 Unspecified pre-existing hypertension complicating pregnancy, third trimester: Secondary | ICD-10-CM | POA: Insufficient documentation

## 2020-06-23 DIAGNOSIS — O99213 Obesity complicating pregnancy, third trimester: Secondary | ICD-10-CM

## 2020-06-23 DIAGNOSIS — O10013 Pre-existing essential hypertension complicating pregnancy, third trimester: Secondary | ICD-10-CM

## 2020-06-23 DIAGNOSIS — O10912 Unspecified pre-existing hypertension complicating pregnancy, second trimester: Secondary | ICD-10-CM

## 2020-06-23 DIAGNOSIS — Z3A32 32 weeks gestation of pregnancy: Secondary | ICD-10-CM | POA: Diagnosis not present

## 2020-06-23 DIAGNOSIS — O24419 Gestational diabetes mellitus in pregnancy, unspecified control: Secondary | ICD-10-CM

## 2020-06-23 DIAGNOSIS — O24415 Gestational diabetes mellitus in pregnancy, controlled by oral hypoglycemic drugs: Secondary | ICD-10-CM | POA: Insufficient documentation

## 2020-06-23 DIAGNOSIS — Z9189 Other specified personal risk factors, not elsewhere classified: Secondary | ICD-10-CM

## 2020-06-23 DIAGNOSIS — Z3403 Encounter for supervision of normal first pregnancy, third trimester: Secondary | ICD-10-CM | POA: Diagnosis not present

## 2020-06-23 DIAGNOSIS — O99212 Obesity complicating pregnancy, second trimester: Secondary | ICD-10-CM

## 2020-06-23 DIAGNOSIS — O0993 Supervision of high risk pregnancy, unspecified, third trimester: Secondary | ICD-10-CM

## 2020-06-23 LAB — POCT URINALYSIS DIPSTICK OB
Nitrite, UA: NEGATIVE
Spec Grav, UA: >=1.03 — AB (ref 1.010–1.025)
Urobilinogen, UA: 0.2 E.U./dL
pH, UA: 6 (ref 5.0–8.0)

## 2020-06-23 MED ORDER — GLYBURIDE 2.5 MG PO TABS: 2.5000 mg | ORAL_TABLET | Freq: Every day | ORAL | 0 refills | Status: DC

## 2020-06-23 NOTE — Progress Notes (Signed)
OB- Pt present for routine prenatal care and NST. Pt stated having cramping in the upper area of the abd and braxton hick contractions. Urine culture completed.

## 2020-06-23 NOTE — Progress Notes (Signed)
ROB: Patient seen in triage for pelvic cramping over the weekend.  Diagnosed with BV infection and prescribed Flagyl, which she reports compliance.  Still noting some cramping, but not as bad as before. Also, warm showers help.  Is due for repeat growth scan due to concerns for IUGR (growth 13%ile), scheduled for later today with MFM.  NST performed today was reviewed and was found to be reactive.  Continue recommended antenatal testing and prenatal care. Advised that antenatal testing frequency may increase based on results of today's ultrasound. Reviewed blood sugars, as glucosuria still present. Fasting levels wnl, however postprandials range from 120s-140s.  Advised to initiate Glyburide 2.5 mg daily (in a.m.). RTC in 1 week for NST and review of blood sugars. Cautioned of hypoglycemia. RTC in 2 weeks for ROB. Will likely plan for delivery around [redacted] weeks gestation.    NONSTRESS TEST INTERPRETATION  INDICATIONS: Chronic hypertension, Obesity, GDM (diet) and IUGR  FHR baseline: 140 RESULTS:Reactive and Inconclusive COMMENTS: No contractions.   PLAN: 1. Continue fetal kick counts twice a day. 2. Continue antepartum testing as scheduled-weekly

## 2020-06-23 NOTE — Patient Instructions (Addendum)
Gestational Diabetes Mellitus, Self-Care Caring for yourself after a diagnosis of gestational diabetes mellitus means keeping your blood sugar under control. This can be done through nutrition, exercise, lifestyle changes, insulin and other medicines, and support from your health care team. Your health care provider will set individualized treatment goals for you. What are the risks? If left untreated, gestational diabetes can cause problems for mother and baby. For the mother Women who get gestational diabetes are more likely to:  Have labor induced and deliver early.  Have problems during labor and delivery, if the baby is larger than normal. This includes difficult labor and damage to the birth canal.  Have a cesarean delivery.  Have problems with blood pressure, including high blood pressure and preeclampsia.  Get it again if they become pregnant.  Develop type 2 diabetes in the future. For the baby Gestational diabetes that is not treated can cause the baby to have:  Low blood glucose (hypoglycemia).  Larger-than-normal body size (macrosomia).  Breathing problems. How to monitor blood glucose Check your blood glucose every day and as often as told by your health care provider. To do this: 1. Wash your hands with soap and water for at least 20 seconds. 2. Prick the side of your finger (not the tip) with the lancet. Use a different finger each time. 3. Gently rub the finger until a small drop of blood appears. 4. Follow instructions that come with your meter for inserting the test strip, applying blood to the strip, and getting the result. 5. Write down your result and any notes. Blood glucose goals are:  95 mg/dL (5.3 mmol/L) when fasting.  140 mg/dL (7.8 mmol/L) 1 hour after a meal.  120 mg/dL (6.7 mmol/L) 2 hours after a meal.   Follow these instructions at home: Medicines  Take over-the-counter and prescription medicines only as told by your health care  provider.  If your health care provider prescribed insulin or other diabetes medicines: ? Take them every day. ? Do not run out of insulin or other medicines. Plan ahead so you always have them available. Eating and drinking  Follow instructions from your health care provider about eating or drinking restrictions.  See a diet and nutrition expert (registered dietician) to help you create an eating plan that helps control your diabetes. The foods in this plan will include: ? Lean proteins. ? Complex carbohydrates. These are carbohydrates that contain fiber, have a lot of nutrients, and are digested slowly. They include dried beans, nuts, and whole grain breads, cereals, or pasta. ? Fresh fruits and vegetables. ? Low-fat dairy products. ? Healthy fats.  Eat healthy snacks between nutritious meals.  Drink enough fluid to keep your urine pale yellow.  Keep a record of the carbohydrates that you eat. To do this: ? Read food labels. ? Learn the standard serving sizes of foods.  Make a sick day plan with your health care provider before you get sick. Follow this plan whenever you cannot eat or drink as usual.   Activity  Do exercises as told by your health care provider.  Do 30 or more minutes of physical activity a day, or as much physical activity as your health care provider recommends. It may help to control blood glucose levels after a meal if you: ? Do 10 minutes of exercise after each meal. ? Start this exercise 30 minutes after the meal.  If you start a new exercise or activity, work with your health care provider to adjust your  insulin, other medicines, or food as needed. Lifestyle  Do not drink alcohol.  Do not use any products that contain nicotine or tobacco, such as cigarettes, e-cigarettes, and chewing tobacco. If you need help quitting, ask your health care provider.  Learn to manage stress. If you need help with this, ask your health care provider. Body care  Keep  your vaccines up to date.  Practice good oral hygiene. To do this: ? Clean your teeth and gums two times a day. ? Floss one or more times a day. ? Visit your dentist one or more times every 6 months.  Stay at a healthy weight while you are pregnant. Your expected weight gain depends on your BMI (body mass index) before pregnancy. General instructions  Talk with your health care provider about the risk for high blood pressure during pregnancy (preeclampsia and eclampsia).  Share your diabetes management plan with people in your workplace, school, and household.  Check your urine for ketones when sick and as told by your health care provider. Ketones are made by the liver when a lack of glucose forces the body to use fat for energy.  Carry a medical alert card or wear medical alert jewelry that says you have gestational diabetes.  Keep all follow-up visits. This is important. Get care after delivery  Have your blood glucose level checked with an oral glucose tolerance test (OGTT) 4-12 weeks after delivery.  Get screened for diabetes at least every 3 years, or as often as told by your health care provider. Where to find more information  American Diabetes Association (ADA): diabetes.org  Association of Diabetes Care & Education Specialists (ADCES): diabeteseducator.org  Centers for Disease Control and Prevention (CDC): StoreMirror.com.cy  American Pregnancy Association: americanpregnancy.org  [place] Department of Agriculture MyPlate: http://www.wilson-mendoza.org/ Contact a health care provider if:  Your blood glucose is above your target for two tests in a row.  You have a fever.  You have been sick for 2 days or more and are not getting better.  You have either of these problems for more than 6 hours: ? Vomiting every time you eat or drink. ? Diarrhea. Get help right away if you:  Become confused or cannot think clearly.  Have trouble breathing.  Have moderate or high ketones in your  urine.  Feel your baby is not moving as usual.  Develop unusual discharge or bleeding from your vagina.  Start having early (premature) contractions. Contractions may feel like a tightening in your lower abdomen  Have a severe headache. These symptoms may represent a serious problem that is an emergency. Do not wait to see if the symptoms will go away. Get medical help right away. Call your local emergency services (911 in the [place] Do not drive yourself to the hospital. Summary  Check your blood glucose every day during your pregnancy. Do this as often as told by your health care provider.  Take insulin or other diabetes medicines every day, if your health care provider prescribed them.  Have your blood glucose level checked 4-12 weeks after delivery.  Keep all follow-up visits. This is important. This information is not intended to replace advice given to you by your health care provider. Make sure you discuss any questions you have with your health care provider. Document Revised: 08/19/2019 Document Reviewed: 08/19/2019 Elsevier Patient Education  2021 [employer].  Glyburide; Metformin tablets What is this medicine? GLYBURIDE; METFORMIN (GLYE byoor ide; met FOR min) to treat type 2 diabetes. It is combined  with a diet and exercise. This medicine helps your body to use insulin better. This medicine may be used for other purposes; ask your health care provider or pharmacist if you have questions. COMMON BRAND NAME(S): Glucovance What should I tell my health care provider before I take this medicine? They need to know if you have any of these conditions:  anemia  dehydration  diabetic ketoacidosis  glucose-6-phosphate dehydrogenase deficiency  heart disease  if you often drink alcohol  kidney disease  liver disease  polycystic ovary syndrome  serious infection or injury  thyroid disease  vomiting  an unusual or allergic reaction to glyburide, metformin,  sulfa drugs, other medicines, foods, dyes, or preservatives  pregnant or trying to get pregnant  breast-feeding How should I use this medicine? Take this medicine by mouth with meals. Swallow with a drink of water. Follow the directions on the prescription label. Take your medicine at the same time each day. Do not take more often than directed. Talk to your pediatrician regarding the use of this medicine in children. Special care may be needed. Patients over 61 years old may need a smaller dose than younger adults. Overdosage: If you think you have taken too much of this medicine contact a poison control center or emergency room at once. NOTE: This medicine is only for you. Do not share this medicine with others. What if I miss a dose? If you miss a dose, take it as soon as you can. If it is almost time for your next dose, take only that dose. Do not take double or extra doses. What may interact with this medicine? Do not take this medicine with any of the following medications:  bosentan  certain contrast medicines given before X-rays, CT scans, MRI, or other procedures  dofetilide This medicine may also interact with the following medications:  acetazolamide  alcohol  aspirin and aspirin-like medicines  certain antivirals for HIV or hepatitis  certain medicines for blood pressure, heart disease, irregular heart beat  chloramphenicol  cimetidine  ciprofloxacin  clarithromycin  colesevelam  dichlorphenamide  digoxin  diuretics  female hormones, like estrogens or progestins and birth control pills  glycopyrrolate  isoniazid  lamotrigine  medicines called MAO Inhibitors like Nardil, Parnate, Marplan, Eldepryl  memantine  methazolamide  metoclopramide  miconazole  midodrine  niacin  NSAIDs, medicines for pain and inflammation, like ibuprofen or naproxen  phenothiazines like chlorpromazine, mesoridazine, prochlorperazine,  thioridazine  phenytoin  probenecid  ranolazine  steroid medicines like prednisone or cortisone  stimulant medicines for attention disorders, weight loss, or to stay awake  thyroid medicines  topiramate  trospium  vandetanib  warfarin  zonisamide This list may not describe all possible interactions. Give your health care provider a list of all the medicines, herbs, non-prescription drugs, or dietary supplements you use. Also tell them if you smoke, drink alcohol, or use illegal drugs. Some items may interact with your medicine. What should I watch for while using this medicine? Visit your doctor or health care professional for regular checks on your progress. A test called the HbA1C (A1C) will be monitored. This is a simple blood test. It measures your blood sugar control over the last 2 to 3 months. You will receive this test every 3 to 6 months. Learn how to check your blood sugar. Learn the symptoms of low and high blood sugar and how to manage them. Always carry a quick-source of sugar with you in case you have symptoms of low blood sugar.  Examples include hard sugar candy or glucose tablets. Make sure others know that you can choke if you eat or drink when you develop serious symptoms of low blood sugar, such as seizures or unconsciousness. They must get medical help at once. Tell your doctor or health care professional if you have high blood sugar. You might need to change the dose of your medicine. If you are sick or exercising more than usual, you might need to change the dose of your medicine. Do not skip meals. Ask your doctor or health care professional if you should avoid alcohol. Many nonprescription cough and cold products contain sugar or alcohol. These can affect blood sugar. This medicine may cause ovulation in premenopausal women who do not have regular monthly periods. This may increase your chances of becoming pregnant. You should not take this medicine if you  become pregnant or think you may be pregnant. Talk with your doctor or health care professional about your birth control options while taking this medicine. Contact your doctor or health care professional right away if think you are pregnant. If you are going to need surgery, an MRI, CT scan, or other procedure, tell your doctor that you are taking this medicine. You may need to stop taking this medicine before the procedure. This medicine can make you more sensitive to the sun. Keep out of the sun. If you cannot avoid being in the sun, wear protective clothing and use sunscreen. Do not use sun lamps or tanning beds/booths. Wear a medical ID bracelet or chain, and carry a card that describes your disease and details of your medicine and dosage times. You should make sure you get enough vitamins while you are taking this medicine. Discuss the foods you eat and the vitamins you take with your health care professional. What side effects may I notice from receiving this medicine? Side effects that you should report to your doctor or health care professional as soon as possible:  allergic reactions like skin rash, itching or hives, swelling of the face, lips, or tongue  breathing problems  dark urine  feeling faint or lightheaded, falls  fever, chills, sore throat  muscle aches or pains  redness, blistering, peeling, or loosening of the skin, including inside the mouth  signs and symptoms of low blood sugar such as feeling anxious, confusion, dizziness, increased hunger, unusually weak or tired, sweating, shakiness, cold, irritable, headache, blurred vision, fast heartbeat, loss of consciousness  slow or irregular heartbeat  unusual bleeding or bruising  unusual stomach upset or pain  vomiting  yellowing of the eyes or skin Side effects that usually do not require medical attention (report to your doctor or health care professional if they continue or are  bothersome):  diarrhea  dizziness  headache  gas  metallic taste in mouth  upset stomach This list may not describe all possible side effects. Call your doctor for medical advice about side effects. You may report side effects to FDA at 1-800-FDA-1088. Where should I keep my medicine? Keep out of the reach of children. Store at room temperature between 15 and 25 degrees C (59 and 77 degrees F). Keep container tightly closed and protect from light. Throw away any unused medicine after the expiration date. NOTE: This sheet is a summary. It may not cover all possible information. If you have questions about this medicine, talk to your doctor, pharmacist, or health care provider.  2021 Elsevier/Gold Standard (2017-04-29 14:51:27)

## 2020-06-24 NOTE — Progress Notes (Signed)
FYI

## 2020-06-24 NOTE — Progress Notes (Signed)
Thanks

## 2020-06-26 LAB — URINE CULTURE

## 2020-07-01 ENCOUNTER — Ambulatory Visit (INDEPENDENT_AMBULATORY_CARE_PROVIDER_SITE_OTHER): Payer: Medicaid Other | Admitting: Obstetrics and Gynecology

## 2020-07-01 ENCOUNTER — Other Ambulatory Visit: Payer: Self-pay

## 2020-07-01 VITALS — BP 144/92 | HR 88 | Wt 258.9 lb

## 2020-07-01 DIAGNOSIS — I159 Secondary hypertension, unspecified: Secondary | ICD-10-CM | POA: Diagnosis not present

## 2020-07-01 DIAGNOSIS — Z3A33 33 weeks gestation of pregnancy: Secondary | ICD-10-CM

## 2020-07-01 DIAGNOSIS — O99213 Obesity complicating pregnancy, third trimester: Secondary | ICD-10-CM

## 2020-07-01 DIAGNOSIS — Z3493 Encounter for supervision of normal pregnancy, unspecified, third trimester: Secondary | ICD-10-CM | POA: Diagnosis not present

## 2020-07-01 DIAGNOSIS — O10013 Pre-existing essential hypertension complicating pregnancy, third trimester: Secondary | ICD-10-CM

## 2020-07-01 DIAGNOSIS — O99210 Obesity complicating pregnancy, unspecified trimester: Secondary | ICD-10-CM | POA: Diagnosis not present

## 2020-07-01 DIAGNOSIS — O24419 Gestational diabetes mellitus in pregnancy, unspecified control: Secondary | ICD-10-CM

## 2020-07-01 NOTE — Progress Notes (Signed)
NST performed today was reviewed and was found to be reactive.  Continue recommended antenatal testing and prenatal care.  Reviewed glucose log. Patient also initiated on Glyburide last week, 2.5 mg in a.m. for better control of blood sugars during the day.  Denies any side effects or hypoglycemic episodes.  Fastings 62-91, Postprandials all below 130 now.   NONSTRESS TEST INTERPRETATION  INDICATIONS: Gestational diabetes (Glyburide), Chronic hypertension and Obesity  FHR baseline: 150 bpm RESULTS:Reactive COMMENTS: No contractions.  BPs 140s/90s.  Currently on Labetalol 200 mg BID.    PLAN: 1. Continue fetal kick counts twice a day. 2. Continue antepartum testing as scheduled-weekly.  3. Continue Glyburide 2.5 mg in a.m. for GDM and Labetalol 200 mg BID for cHTN.

## 2020-07-01 NOTE — Progress Notes (Signed)
Pt present for NST only.

## 2020-07-02 ENCOUNTER — Other Ambulatory Visit: Payer: Self-pay

## 2020-07-02 ENCOUNTER — Other Ambulatory Visit: Payer: Self-pay | Admitting: Certified Nurse Midwife

## 2020-07-02 ENCOUNTER — Ambulatory Visit: Payer: Medicaid Other | Attending: Maternal & Fetal Medicine

## 2020-07-02 DIAGNOSIS — O24415 Gestational diabetes mellitus in pregnancy, controlled by oral hypoglycemic drugs: Secondary | ICD-10-CM

## 2020-07-02 DIAGNOSIS — O24419 Gestational diabetes mellitus in pregnancy, unspecified control: Secondary | ICD-10-CM

## 2020-07-02 DIAGNOSIS — O99213 Obesity complicating pregnancy, third trimester: Secondary | ICD-10-CM

## 2020-07-02 DIAGNOSIS — Z3A33 33 weeks gestation of pregnancy: Secondary | ICD-10-CM | POA: Insufficient documentation

## 2020-07-02 DIAGNOSIS — O10913 Unspecified pre-existing hypertension complicating pregnancy, third trimester: Secondary | ICD-10-CM

## 2020-07-02 NOTE — Progress Notes (Signed)
See report. Thanks, JML

## 2020-07-03 NOTE — Progress Notes (Signed)
Thanks

## 2020-07-05 ENCOUNTER — Other Ambulatory Visit: Payer: Self-pay | Admitting: Certified Nurse Midwife

## 2020-07-05 DIAGNOSIS — O99213 Obesity complicating pregnancy, third trimester: Secondary | ICD-10-CM

## 2020-07-05 DIAGNOSIS — O24419 Gestational diabetes mellitus in pregnancy, unspecified control: Secondary | ICD-10-CM

## 2020-07-05 DIAGNOSIS — O10913 Unspecified pre-existing hypertension complicating pregnancy, third trimester: Secondary | ICD-10-CM

## 2020-07-07 ENCOUNTER — Encounter: Payer: Self-pay | Admitting: Obstetrics and Gynecology

## 2020-07-07 ENCOUNTER — Other Ambulatory Visit (INDEPENDENT_AMBULATORY_CARE_PROVIDER_SITE_OTHER): Payer: Medicaid Other

## 2020-07-07 ENCOUNTER — Other Ambulatory Visit: Payer: Self-pay

## 2020-07-07 ENCOUNTER — Ambulatory Visit (INDEPENDENT_AMBULATORY_CARE_PROVIDER_SITE_OTHER): Payer: Medicaid Other | Admitting: Obstetrics and Gynecology

## 2020-07-07 VITALS — BP 132/81 | HR 96 | Wt 260.7 lb

## 2020-07-07 DIAGNOSIS — Z3493 Encounter for supervision of normal pregnancy, unspecified, third trimester: Secondary | ICD-10-CM | POA: Diagnosis not present

## 2020-07-07 DIAGNOSIS — O24419 Gestational diabetes mellitus in pregnancy, unspecified control: Secondary | ICD-10-CM

## 2020-07-07 DIAGNOSIS — Z3A34 34 weeks gestation of pregnancy: Secondary | ICD-10-CM

## 2020-07-07 DIAGNOSIS — I159 Secondary hypertension, unspecified: Secondary | ICD-10-CM

## 2020-07-07 DIAGNOSIS — Z0289 Encounter for other administrative examinations: Secondary | ICD-10-CM

## 2020-07-07 LAB — POCT URINALYSIS DIPSTICK OB
Bilirubin, UA: NEGATIVE
Blood, UA: NEGATIVE
Glucose, UA: NEGATIVE
Ketones, UA: NEGATIVE
Leukocytes, UA: NEGATIVE
Nitrite, UA: NEGATIVE
POC,PROTEIN,UA: NEGATIVE
Spec Grav, UA: 1.01 (ref 1.010–1.025)
Urobilinogen, UA: 0.2 E.U./dL
pH, UA: 8 (ref 5.0–8.0)

## 2020-07-07 NOTE — Progress Notes (Signed)
ROB: She has no complaints.  NST today reactive.  Most recent ultrasound for growth shows baby at 34th percentile. (Up from 13th percentile).  Sugar log reviewed-sugars excellent on 2.5 of glyburide daily.  Plan weekly NSTs.  Patient does not need weekly BPP's.  Consider growth in 2 to 3 weeks.  Plan early delivery during 37th week for hypertension indication.

## 2020-07-09 ENCOUNTER — Other Ambulatory Visit: Payer: Medicaid Other

## 2020-07-11 ENCOUNTER — Other Ambulatory Visit: Payer: Self-pay | Admitting: Certified Nurse Midwife

## 2020-07-11 DIAGNOSIS — O24419 Gestational diabetes mellitus in pregnancy, unspecified control: Secondary | ICD-10-CM

## 2020-07-11 DIAGNOSIS — O10913 Unspecified pre-existing hypertension complicating pregnancy, third trimester: Secondary | ICD-10-CM

## 2020-07-11 DIAGNOSIS — O99213 Obesity complicating pregnancy, third trimester: Secondary | ICD-10-CM

## 2020-07-13 ENCOUNTER — Telehealth: Payer: Self-pay | Admitting: Obstetrics and Gynecology

## 2020-07-13 NOTE — Telephone Encounter (Signed)
Patient called to check on the status of her FMLA paperwork- asked for [place] to send her a message via MyChart.

## 2020-07-13 NOTE — Telephone Encounter (Signed)
Let patient know that it would be done by the end of this week. Papers was dropped off on 07/07/2020. Explained that we was not in the office on Friday. I let her know that I would fax them as soon as they are done.

## 2020-07-14 ENCOUNTER — Encounter: Payer: Self-pay | Admitting: Obstetrics and Gynecology

## 2020-07-14 ENCOUNTER — Other Ambulatory Visit: Payer: Medicaid Other

## 2020-07-14 ENCOUNTER — Other Ambulatory Visit: Payer: Self-pay | Admitting: Obstetrics and Gynecology

## 2020-07-14 ENCOUNTER — Ambulatory Visit (INDEPENDENT_AMBULATORY_CARE_PROVIDER_SITE_OTHER): Payer: Medicaid Other | Admitting: Obstetrics and Gynecology

## 2020-07-14 ENCOUNTER — Other Ambulatory Visit: Payer: Self-pay

## 2020-07-14 VITALS — BP 133/93 | HR 128 | Ht 66.0 in | Wt 257.0 lb

## 2020-07-14 DIAGNOSIS — O24419 Gestational diabetes mellitus in pregnancy, unspecified control: Secondary | ICD-10-CM

## 2020-07-14 DIAGNOSIS — O0993 Supervision of high risk pregnancy, unspecified, third trimester: Secondary | ICD-10-CM | POA: Diagnosis not present

## 2020-07-14 DIAGNOSIS — O99210 Obesity complicating pregnancy, unspecified trimester: Secondary | ICD-10-CM

## 2020-07-14 DIAGNOSIS — O10013 Pre-existing essential hypertension complicating pregnancy, third trimester: Secondary | ICD-10-CM

## 2020-07-14 DIAGNOSIS — Z3A35 35 weeks gestation of pregnancy: Secondary | ICD-10-CM

## 2020-07-14 LAB — POCT URINALYSIS DIPSTICK OB
Bilirubin, UA: NEGATIVE
Glucose, UA: NEGATIVE
Ketones, UA: 40
Leukocytes, UA: NEGATIVE
Nitrite, UA: NEGATIVE
Spec Grav, UA: >=1.03 — AB (ref 1.010–1.025)
Urobilinogen, UA: 0.2 E.U./dL
pH, UA: 6 (ref 5.0–8.0)

## 2020-07-14 NOTE — Progress Notes (Signed)
ROB: Patient complains of increase in pelvic pressure. Also noting some intermittent contractions (more painful than Deberah Pelton but not full labor contractions). Reports blood sugars all wnl, compliant with Glyburide. Discussed upcoming plans for IOL at 37 weeks for cHTN (and GDM on Glyburide/obesity).  Plans for May 1.  Discussed need for pre-admission COVID testing. NST performed today was reviewed and was found to be reactive.  Continue recommended antenatal testing and prenatal care. RTC in 1 week for 36 week cultures and NST.     NONSTRESS TEST INTERPRETATION  INDICATIONS: Gestational DM (Glyburide), Chronic hypertension and Obesity  FHR baseline: 140 bpm RESULTS:Reactive COMMENTS: 1 area discontinuous, with possible deceleration beginning vs change in baseline.   PLAN: 1. Continue fetal kick counts twice a day. 2. Continue antepartum testing as scheduled-weekly

## 2020-07-14 NOTE — Patient Instructions (Addendum)
COVID 19 Instructions for Scheduled Procedure (Inductions/C-sections and GYN surgeries)   Thank you for choosing [facility] for your services.  You have been scheduled for a procedure called ______Induction of Labor_____________.    Your procedure is scheduled on _______Sunday, Jul 26, 2020 (arrive at midnight)_____________.  You are required to have COVID-19 testing performed 2-3 days prior to your scheduled procedure date.  Testing is performed between 9 AM and 4 PM Monday through Friday.  Please present for testing on ___Thursday, April 27th____ during this hour.Testing is performed in [facility] (this is next to the [facility]).    Upon your scheduled procedure date, you will need to arrive at the [facility] entrance. (There is a statue at the front of this entrance.)   Please arrive on time if you are scheduled for an induction of labor.   If you are scheduled for a Cesarean delivery or for Gyn Surgery, arrive 2 hours prior to your procedure time.   If you are an Obstetric patient and your arrival time falls between 11 PM and 6 AM call L&D [phone]) when you arrive.  A staff member will meet you at the [facility] entrance.  At this time, patients are allowed 1 support person to accompany them. Face masks are required for you and your support person. Your support person is now allowed to be there with you during the entire time of your admission.   Please contact the office if you have any questions regarding this information.  The [facility] office number is [phone].     Thank you,    Your [facility] Providers     Labor Induction Labor induction is when steps are taken to cause a pregnant woman to begin the labor process. Most women go into labor on their own between 37 weeks and 42 weeks of pregnancy. When this does not happen, or when there is a medical need for labor to begin, steps may be taken to induce, or bring on, labor. Labor  induction causes a pregnant woman's uterus to contract. It also causes the cervix to soften (ripen), open (dilate), and thin out. Usually, labor is not induced before 39 weeks of pregnancy unless there is a medical reason to do so. When is labor induction considered? Labor induction may be right for you if:  Your pregnancy lasts longer than 41 to 42 weeks.  Your placenta is separating from your uterus (placental abruption).  You have a rupture of membranes and your labor does not begin.  You have health problems, like diabetes or high blood pressure (preeclampsia) during your pregnancy.  Your baby has stopped growing or does not have enough amniotic fluid. Before labor induction begins, your health care provider will consider the following factors:  Your medical condition and the baby's condition.  How many weeks you have been pregnant.  How mature the baby's lungs are.  The condition of your cervix.  The position of the baby.  The size of your birth canal. Tell a health care provider about:  Any allergies you have.  All medicines you are taking, including vitamins, herbs, eye drops, creams, and over-the-counter medicines.  Any problems you or your family members have had with anesthetic medicines.  Any surgeries you have had.  Any blood disorders you have.  Any medical conditions you have. What are the risks? Generally, this is a safe procedure. However, problems may occur, including:  Failed induction.  Changes in fetal  heart rate, such as being too high, too low, or irregular (erratic).  Infection in the mother or the baby.  Increased risk of having a cesarean delivery.  Breaking off (abruption) of the placenta from the uterus. This is rare.  Rupture of the uterus. This is very rare.  Your baby could fail to get enough blood flow or oxygen. This can be life-threatening. When induction is needed for medical reasons, the benefits generally outweigh the  risks. What happens during the procedure? During the procedure, your health care provider will use one of these methods to induce labor:  Stripping the membranes. In this method, the amniotic sac tissue is gently separated from the cervix. This causes the following to happen: ? Your cervix stretches, which in turn causes the release of prostaglandins. ? Prostaglandins induce labor and cause the uterus to contract. ? This procedure is often done in an office visit. You will be sent home to wait for contractions to begin.  Prostaglandin medicine. This medicine starts contractions and causes the cervix to dilate and ripen. This can be taken by mouth (orally) or by being inserted into the vagina (suppository).  Inserting a small, thin tube (catheter) with a balloon into the vagina and then expanding the balloon with water to dilate the cervix.  Breaking the water. In this method, a small instrument is used to make a small hole in the amniotic sac. This eventually causes the amniotic sac to break. Contractions should begin within a few hours.  Medicine to trigger or strengthen contractions. This medicine is given through an IV that is inserted into a vein in your arm. This procedure may vary among health care providers and hospitals.   Where to find more information  March of Dimes: www.marchofdimes.org  [employer] of Obst[employer]ians and Gynecologists: www.acog.org Summary  Labor induction causes a pregnant woman's uterus to contract. It also causes the cervix to soften (ripen), open (dilate), and thin out.  Labor is usually not induced before 39 weeks of pregnancy unless there is a medical reason to do so.  When induction is needed for medical reasons, the benefits generally outweigh the risks.  Talk with your health care provider about which methods of labor induction are right for you. This information is not intended to replace advice given to you by your health care provider.  Make sure you discuss any questions you have with your health care provider. Document Revised: 12/26/2019 Document Reviewed: 12/26/2019 Elsevier Patient Education  2021 [employer].

## 2020-07-14 NOTE — Progress Notes (Signed)
OB-Pt present for routine prenatal care and NST.

## 2020-07-16 ENCOUNTER — Other Ambulatory Visit: Payer: Self-pay

## 2020-07-16 ENCOUNTER — Ambulatory Visit: Payer: Medicaid Other | Attending: Maternal & Fetal Medicine

## 2020-07-16 DIAGNOSIS — E669 Obesity, unspecified: Secondary | ICD-10-CM | POA: Diagnosis not present

## 2020-07-16 DIAGNOSIS — O24419 Gestational diabetes mellitus in pregnancy, unspecified control: Secondary | ICD-10-CM | POA: Insufficient documentation

## 2020-07-16 DIAGNOSIS — O99213 Obesity complicating pregnancy, third trimester: Secondary | ICD-10-CM | POA: Diagnosis not present

## 2020-07-16 DIAGNOSIS — Z3A35 35 weeks gestation of pregnancy: Secondary | ICD-10-CM | POA: Diagnosis not present

## 2020-07-16 DIAGNOSIS — O10913 Unspecified pre-existing hypertension complicating pregnancy, third trimester: Secondary | ICD-10-CM | POA: Diagnosis not present

## 2020-07-21 ENCOUNTER — Encounter: Payer: Self-pay | Admitting: Obstetrics and Gynecology

## 2020-07-21 ENCOUNTER — Ambulatory Visit (INDEPENDENT_AMBULATORY_CARE_PROVIDER_SITE_OTHER): Payer: Medicaid Other | Admitting: Obstetrics and Gynecology

## 2020-07-21 ENCOUNTER — Other Ambulatory Visit: Payer: Medicaid Other

## 2020-07-21 ENCOUNTER — Other Ambulatory Visit: Payer: Self-pay

## 2020-07-21 VITALS — BP 126/84 | HR 80 | Wt 260.0 lb

## 2020-07-21 DIAGNOSIS — O99210 Obesity complicating pregnancy, unspecified trimester: Secondary | ICD-10-CM

## 2020-07-21 DIAGNOSIS — Z3A36 36 weeks gestation of pregnancy: Secondary | ICD-10-CM

## 2020-07-21 DIAGNOSIS — O10013 Pre-existing essential hypertension complicating pregnancy, third trimester: Secondary | ICD-10-CM

## 2020-07-21 DIAGNOSIS — O24419 Gestational diabetes mellitus in pregnancy, unspecified control: Secondary | ICD-10-CM

## 2020-07-21 DIAGNOSIS — O0993 Supervision of high risk pregnancy, unspecified, third trimester: Secondary | ICD-10-CM

## 2020-07-21 NOTE — Progress Notes (Signed)
ROB: Chronic hypertension- controlled with labetalol.  Gestational diabetes sugars excellent with glyburide.  Patient has occasional contractions.  NST reactive today.  GBS GC/CT performed today.  Patient scheduled for COVID testing on Thursday with induction of labor scheduled for this weekend.  All questions answered.

## 2020-07-21 NOTE — Addendum Note (Signed)
Addended by: Dorian Pod on: 07/21/2020 11:40 AM   Modules accepted: Orders

## 2020-07-23 ENCOUNTER — Other Ambulatory Visit: Payer: Self-pay

## 2020-07-23 ENCOUNTER — Other Ambulatory Visit
Admission: RE | Admit: 2020-07-23 | Discharge: 2020-07-23 | Disposition: A | Discharge status: Home / Self Care | Payer: Medicaid Other | Source: Ambulatory Visit | Attending: Obstetrics and Gynecology | Admitting: Obstetrics and Gynecology

## 2020-07-23 ENCOUNTER — Other Ambulatory Visit: Payer: Medicaid Other

## 2020-07-23 DIAGNOSIS — Z01812 Encounter for preprocedural laboratory examination: Secondary | ICD-10-CM | POA: Diagnosis not present

## 2020-07-23 DIAGNOSIS — Z20822 Contact with and (suspected) exposure to covid-19: Secondary | ICD-10-CM | POA: Insufficient documentation

## 2020-07-23 LAB — GC/CHLAMYDIA PROBE AMP
Chlamydia trachomatis, NAA: NEGATIVE
Neisseria Gonorrhoeae by PCR: NEGATIVE

## 2020-07-23 LAB — STREP GP B NAA: Strep Gp B NAA: NEGATIVE

## 2020-07-24 LAB — SARS CORONAVIRUS 2 (TAT 6-24 HRS): SARS Coronavirus 2: NEGATIVE

## 2020-07-26 ENCOUNTER — Inpatient Hospital Stay: Payer: Medicaid Other | Admitting: Anesthesiology

## 2020-07-26 ENCOUNTER — Inpatient Hospital Stay
Admission: EM | Admit: 2020-07-26 | Discharge: 2020-07-29 | DRG: 807 | Disposition: A | Discharge status: Home / Self Care | Payer: Medicaid Other | Source: Ambulatory Visit | Attending: Obstetrics and Gynecology | Admitting: Obstetrics and Gynecology

## 2020-07-26 ENCOUNTER — Other Ambulatory Visit: Payer: Self-pay

## 2020-07-26 ENCOUNTER — Encounter: Payer: Self-pay | Admitting: Obstetrics and Gynecology

## 2020-07-26 DIAGNOSIS — O24419 Gestational diabetes mellitus in pregnancy, unspecified control: Secondary | ICD-10-CM | POA: Diagnosis present

## 2020-07-26 DIAGNOSIS — O99214 Obesity complicating childbirth: Secondary | ICD-10-CM | POA: Diagnosis present

## 2020-07-26 DIAGNOSIS — O10919 Unspecified pre-existing hypertension complicating pregnancy, unspecified trimester: Secondary | ICD-10-CM | POA: Diagnosis present

## 2020-07-26 DIAGNOSIS — O9081 Anemia of the puerperium: Secondary | ICD-10-CM | POA: Diagnosis not present

## 2020-07-26 DIAGNOSIS — O1002 Pre-existing essential hypertension complicating childbirth: Principal | ICD-10-CM | POA: Diagnosis present

## 2020-07-26 DIAGNOSIS — Z3A37 37 weeks gestation of pregnancy: Secondary | ICD-10-CM | POA: Diagnosis not present

## 2020-07-26 DIAGNOSIS — O24425 Gestational diabetes mellitus in childbirth, controlled by oral hypoglycemic drugs: Secondary | ICD-10-CM | POA: Diagnosis present

## 2020-07-26 DIAGNOSIS — Z2839 Other underimmunization status: Secondary | ICD-10-CM

## 2020-07-26 DIAGNOSIS — Z87891 Personal history of nicotine dependence: Secondary | ICD-10-CM

## 2020-07-26 DIAGNOSIS — O99210 Obesity complicating pregnancy, unspecified trimester: Secondary | ICD-10-CM | POA: Diagnosis present

## 2020-07-26 DIAGNOSIS — E669 Obesity, unspecified: Secondary | ICD-10-CM | POA: Diagnosis not present

## 2020-07-26 DIAGNOSIS — O24415 Gestational diabetes mellitus in pregnancy, controlled by oral hypoglycemic drugs: Secondary | ICD-10-CM | POA: Diagnosis not present

## 2020-07-26 DIAGNOSIS — R011 Cardiac murmur, unspecified: Secondary | ICD-10-CM | POA: Diagnosis present

## 2020-07-26 DIAGNOSIS — O09899 Supervision of other high risk pregnancies, unspecified trimester: Secondary | ICD-10-CM | POA: Diagnosis present

## 2020-07-26 HISTORY — DX: Irritable bowel syndrome, unspecified: K58.9

## 2020-07-26 HISTORY — DX: Gestational (pregnancy-induced) hypertension without significant proteinuria, unspecified trimester: O13.9

## 2020-07-26 LAB — CBC
HCT: 36.5 % (ref 36.0–46.0)
Hemoglobin: 12.6 g/dL (ref 12.0–15.0)
MCH: 28.7 pg (ref 26.0–34.0)
MCHC: 34.5 g/dL (ref 30.0–36.0)
MCV: 83.1 fL (ref 80.0–100.0)
Platelets: 215 10*3/uL (ref 150–400)
RBC: 4.39 MIL/uL (ref 3.87–5.11)
RDW: 13.1 % (ref 11.5–15.5)
WBC: 6.8 10*3/uL (ref 4.0–10.5)
nRBC: 0 % (ref 0.0–0.2)

## 2020-07-26 LAB — GLUCOSE, CAPILLARY
Glucose-Capillary: 108 mg/dL — ABNORMAL HIGH (ref 70–99)
Glucose-Capillary: 120 mg/dL — ABNORMAL HIGH (ref 70–99)
Glucose-Capillary: 116 mg/dL — ABNORMAL HIGH (ref 70–99)
Glucose-Capillary: 122 mg/dL — ABNORMAL HIGH (ref 70–99)
Glucose-Capillary: 114 mg/dL — ABNORMAL HIGH (ref 70–99)

## 2020-07-26 LAB — TYPE AND SCREEN
ABO/RH(D): A POS
Antibody Screen: NEGATIVE

## 2020-07-26 LAB — RPR: RPR Ser Ql: NONREACTIVE

## 2020-07-26 MED ORDER — PHENYLEPHRINE 40 MCG/ML (10ML) SYRINGE FOR IV PUSH (FOR BLOOD PRESSURE SUPPORT): 80.0000 ug | PREFILLED_SYRINGE | INTRAVENOUS | Status: DC | PRN

## 2020-07-26 MED ORDER — ONDANSETRON HCL 4 MG/2ML IJ SOLN
4.0000 mg | Freq: Four times a day (QID) | INTRAMUSCULAR | Status: DC | PRN
Start: 2020-07-26 — End: 2020-07-27
  Administered 2020-07-26 (×2): 4 mg via INTRAVENOUS
  Filled 2020-07-26 (×2): qty 2

## 2020-07-26 MED ORDER — LACTATED RINGERS IV SOLN
500.0000 mL | INTRAVENOUS | Status: DC | PRN
  Administered 2020-07-27: 1000 mL via INTRAVENOUS

## 2020-07-26 MED ORDER — LIDOCAINE HCL (PF) 1 % IJ SOLN
INTRAMUSCULAR | Status: DC | PRN
  Administered 2020-07-26: 1.5 mL via SUBCUTANEOUS

## 2020-07-26 MED ORDER — OXYTOCIN-SODIUM CHLORIDE 30-0.9 UT/500ML-% IV SOLN
1.0000 m[IU]/min | INTRAVENOUS | Status: DC
  Administered 2020-07-26: 4 m[IU]/min via INTRAVENOUS

## 2020-07-26 MED ORDER — FENTANYL 2.5 MCG/ML W/ROPIVACAINE 0.15% IN NS 100 ML EPIDURAL (ARMC)
EPIDURAL | Status: AC
  Filled 2020-07-26: qty 100

## 2020-07-26 MED ORDER — TERBUTALINE SULFATE 1 MG/ML IJ SOLN
0.2500 mg | Freq: Once | INTRAMUSCULAR | Status: DC | PRN
Start: 2020-07-26 — End: 2020-07-27

## 2020-07-26 MED ORDER — SOD CITRATE-CITRIC ACID 500-334 MG/5ML PO SOLN: 30.0000 mL | ORAL | Status: DC | PRN

## 2020-07-26 MED ORDER — DIPHENHYDRAMINE HCL 50 MG/ML IJ SOLN: 12.5000 mg | INTRAMUSCULAR | Status: DC | PRN

## 2020-07-26 MED ORDER — EPHEDRINE 5 MG/ML INJ
10.0000 mg | INTRAVENOUS | Status: DC | PRN
  Administered 2020-07-27: 10 mg via INTRAVENOUS

## 2020-07-26 MED ORDER — BUTORPHANOL TARTRATE 1 MG/ML IJ SOLN
1.0000 mg | INTRAMUSCULAR | Status: DC | PRN
  Administered 2020-07-26 (×2): 1 mg via INTRAVENOUS
  Filled 2020-07-26 (×2): qty 1

## 2020-07-26 MED ORDER — OXYCODONE-ACETAMINOPHEN 5-325 MG PO TABS: 2.0000 | ORAL_TABLET | ORAL | Status: DC | PRN

## 2020-07-26 MED ORDER — LACTATED RINGERS IV SOLN
500.0000 mL | Freq: Once | INTRAVENOUS | Status: AC
  Administered 2020-07-26: 500 mL via INTRAVENOUS

## 2020-07-26 MED ORDER — OXYTOCIN BOLUS FROM INFUSION
333.0000 mL | Freq: Once | INTRAVENOUS | Status: AC
  Administered 2020-07-27: 333 mL via INTRAVENOUS

## 2020-07-26 MED ORDER — EPHEDRINE 5 MG/ML INJ
10.0000 mg | INTRAVENOUS | Status: DC | PRN
  Filled 2020-07-26 (×2): qty 4

## 2020-07-26 MED ORDER — ACETAMINOPHEN 325 MG PO TABS: 650.0000 mg | ORAL_TABLET | ORAL | Status: DC | PRN

## 2020-07-26 MED ORDER — MISOPROSTOL 50MCG HALF TABLET
50.0000 ug | ORAL_TABLET | ORAL | Status: DC | PRN
  Administered 2020-07-26 (×3): 50 ug via VAGINAL
  Filled 2020-07-26 (×3): qty 1

## 2020-07-26 MED ORDER — LIDOCAINE-EPINEPHRINE (PF) 1.5 %-1:200000 IJ SOLN
INTRAMUSCULAR | Status: DC | PRN
  Administered 2020-07-26: 3 mL via EPIDURAL

## 2020-07-26 MED ORDER — LIDOCAINE HCL (PF) 1 % IJ SOLN
30.0000 mL | INTRAMUSCULAR | Status: DC | PRN
  Filled 2020-07-26: qty 30

## 2020-07-26 MED ORDER — LACTATED RINGERS IV SOLN: INTRAVENOUS | Status: DC

## 2020-07-26 MED ORDER — OXYTOCIN-SODIUM CHLORIDE 30-0.9 UT/500ML-% IV SOLN
2.5000 [IU]/h | INTRAVENOUS | Status: DC
  Filled 2020-07-26: qty 500

## 2020-07-26 MED ORDER — FENTANYL 2.5 MCG/ML W/ROPIVACAINE 0.15% IN NS 100 ML EPIDURAL (ARMC)
12.0000 mL/h | EPIDURAL | Status: DC
Start: 2020-07-26 — End: 2020-07-27
  Administered 2020-07-26 – 2020-07-27 (×2): 12 mL/h via EPIDURAL
  Filled 2020-07-26: qty 100

## 2020-07-26 MED ORDER — LABETALOL HCL 200 MG PO TABS
200.0000 mg | ORAL_TABLET | Freq: Two times a day (BID) | ORAL | Status: DC
  Administered 2020-07-26 – 2020-07-29 (×7): 200 mg via ORAL
  Filled 2020-07-26 (×4): qty 1
  Filled 2020-07-26 (×2): qty 2
  Filled 2020-07-26 (×2): qty 1

## 2020-07-26 MED ORDER — OXYCODONE-ACETAMINOPHEN 5-325 MG PO TABS
1.0000 | ORAL_TABLET | ORAL | Status: DC | PRN
Start: 2020-07-26 — End: 2020-07-27

## 2020-07-26 NOTE — Anesthesia Preprocedure Evaluation (Signed)
Anesthesia Evaluation  Patient identified by MRN, date of birth, ID band Patient awake    Reviewed: Allergy & Precautions, NPO status , Patient's Chart, lab work & pertinent test results  History of Anesthesia Complications Negative for: history of anesthetic complications  Airway Mallampati: III       Dental   Pulmonary neg sleep apnea, neg COPD, Not current smoker, former smoker,           Cardiovascular hypertension (chronic), Pt. on medications (-) Past MI and (-) CHF (-) dysrhythmias + Valvular Problems/Murmurs (murmur as a child)      Neuro/Psych neg Seizures Anxiety Depression    GI/Hepatic Neg liver ROS, neg GERD  ,  Endo/Other  diabetesMorbid obesity  Renal/GU negative Renal ROS     Musculoskeletal   Abdominal (+) + obese,   Peds  Hematology   Anesthesia Other Findings   Reproductive/Obstetrics (+) Pregnancy                             Anesthesia Physical Anesthesia Plan  ASA: III  Anesthesia Plan: Epidural   Post-op Pain Management:    Induction:   PONV Risk Score and Plan:   Airway Management Planned:   Additional Equipment:   Intra-op Plan:   Post-operative Plan:   Informed Consent: I have reviewed the patients History and Physical, chart, labs and discussed the procedure including the risks, benefits and alternatives for the proposed anesthesia with the patient or authorized representative who has indicated his/her understanding and acceptance.       Plan Discussed with:   Anesthesia Plan Comments:         Anesthesia Quick Evaluation

## 2020-07-26 NOTE — H&P (Signed)
Obstetric History and Physical  Stacy Soto is a 20 y.o. G1P0000 with IUP at 71w1dpresenting for scheduled IOL for cHTN on Labetalol, Gestational DM A2 (on Glyburide), obesity, per MFM recommendations. Patient states she has been having  irregular contractions, no vaginal bleeding, intact membranes, with active fetal movement.    Prenatal Course Source of Care: [facility] with onset of care at 7 weeks (originally in midwifery care but risked out at [redacted] weeks gestation)  Pregnancy complications or risks: Patient Active Problem List   Diagnosis Date Noted  . Chronic hypertension in pregnancy 07/26/2020  . Pregnancy at risk for intrauterine growth restriction 06/23/2020  . Pregnancy, supervision, high-risk, third trimester 06/19/2020  . Pre-existing essential hypertension during pregnancy in third trimester 05/28/2020  . Gestational diabetes mellitus (GDM) affecting first pregnancy 03/12/2020  . Type A blood, Rh positive 03/06/2020  . Rubella non-immune status, antepartum 03/06/2020  . Susceptible to varicella (non-immune), currently pregnant 03/06/2020  . Obesity in pregnancy 03/06/2020  . Hypertension 03/02/2020  . Heart murmur 03/02/2020  . Shortness of breath 03/02/2020  . Leg swelling 03/02/2020   She plans to breastfeed She desires undecided method for postpartum contraception.   Prenatal labs and studies: ABO, Rh: --/--/A POS (05/01 0026) Antibody: NEG (05/01 0026) Rubella: <0.90 (10/06 1000) RPR: NON REACTIVE (05/01 0026)  HBsAg: Negative (10/06 1000)  HIV: Non Reactive (10/06 1000)  GWUJ:WJXBJYNW/- (04/26 1428) 1 hr Glucola  Abnormal (160), 3 hr GTT abnormal. Genetic screening normal Anatomy UKoreanormal   Past Medical History:  Diagnosis Date  . Anxiety   . Cold    head cold for last 7 days (06/14/16)  improving  . Depression   . Gestational diabetes    taking glyburide  . Heart murmur    innocent  . Hypertension   . IBS (irritable bowel syndrome)    . Orthodontics    wears braces  . Pregnancy induced hypertension   . PVCs (premature ventricular contractions)     Past Surgical History:  Procedure Laterality Date  . ADENOIDECTOMY  2005  . NASAL TURBINATE REDUCTION Left 06/28/2016   Procedure: TURBINATE REDUCTION/SUBMUCOSAL RESECTION;  Surgeon: PClyde Canterbury MD;  [place] [facility]  Service: ENT;  Laterality: Left;  . SEPTOPLASTY N/A 06/28/2016   Procedure: SEPTOPLASTY;  Surgeon: PClyde Canterbury MD;  Location: [facility]  Service: ENT;  Laterality: N/A;  . TONSILLECTOMY  2005  . TONSILLECTOMY      OB History  Gravida Para Term Preterm AB Living  1       0    SAB IAB Ectopic Multiple Live Births  0            # Outcome Date GA Lbr Len/2nd Weight Sex Delivery Anes PTL Lv  1 Current             Social History   Socioeconomic History  . Marital status: Significant Other    Spouse name: SLia Hopping . Number of children: Not on file  . Years of education: Not on file  . Highest education level: Not on file  Occupational History  . Occupation: Telecommunicater  Tobacco Use  . Smoking status: Former Smoker    Years: 1.50    Types: E-cigarettes    Quit date: 11/01/2019    Years since quitting: 0.7  . Smokeless tobacco: Never Used  Vaping Use  . Vaping Use: Never used  Substance and Sexual Activity  . Alcohol use: Never  . Drug use:  Never  . Sexual activity: Yes    Birth control/protection: None  Other Topics Concern  . Not on file  Social History Narrative  . Not on file   Social Determinants of Health   Financial Resource Strain: Not on file  Food Insecurity: Not on file  Transportation Needs: Not on file  Physical Activity: Not on file  Stress: Not on file  Social Connections: Not on file    Family History  Problem Relation Age of Onset  . Other Mother        Gertie Fey problems  . Diabetes Father   . Breast cancer Paternal Grandmother   . Diabetes Paternal Grandfather     Medications Prior  to Admission  Medication Sig Dispense Refill Last Dose  . Accu-Chek FastClix Lancets MISC SMARTSIG:1 Topical 4 Times Daily     . ACCU-CHEK GUIDE test strip USE 1 UP TO 4 TIMES DAILY     . aspirin EC 81 MG tablet Take 1 tablet (81 mg total) by mouth daily. Swallow whole. (Patient not taking: Reported on 07/26/2020) 30 tablet 11 Not Taking at Unknown time  . blood glucose meter kit and supplies KIT Dispense based on patient and insurance preference. Use up to four times daily as directed. (FOR ICD-9 250.00, 250.01). 1 each 0   . glyBURIDE (DIABETA) 2.5 MG tablet Take 1 tablet (2.5 mg total) by mouth daily with breakfast. 90 tablet 0   . labetalol (NORMODYNE) 200 MG tablet Take 1 tablet (200 mg total) by mouth 2 (two) times daily. 60 tablet 4   . Prenatal Vit-Fe Fumarate-FA (PRENATAL MULTIVITAMIN) TABS tablet Take 1 tablet by mouth daily at 12 noon.       No Known Allergies  Review of Systems: Negative except for what is mentioned in HPI.  Physical Exam: BP 131/74   Pulse 92   Temp 98.9 F (37.2 C) (Oral)   Resp 14   Ht '5\' 6"'  (1.676 m)   Wt 117.9 kg   LMP 11/09/2019 (Exact Date)   BMI 41.96 kg/m  CONSTITUTIONAL: Well-developed, well-nourished female in no acute distress.  HENT:  Normocephalic, atraumatic, External right and left ear normal. Oropharynx is clear and moist EYES: Conjunctivae and EOM are normal. Pupils are equal, round, and reactive to light. No scleral icterus.  NECK: Normal range of motion, supple, no masses SKIN: Skin is warm and dry. No rash noted. Not diaphoretic. No erythema. No pallor. NEUROLOGIC: Alert and oriented to person, place, and time. Normal reflexes, muscle tone coordination. No cranial nerve deficit noted. PSYCHIATRIC: Normal mood and affect. Normal behavior. Normal judgment and thought content. CARDIOVASCULAR: Normal heart rate noted, regular rhythm RESPIRATORY: Effort and breath sounds normal, no problems with respiration noted ABDOMEN: Soft, nontender,  nondistended, gravid. MUSCULOSKELETAL: Normal range of motion. No edema and no tenderness. 2+ distal pulses.  Cervical Exam: Dilatation 2 cm   Effacement 60%   Station -3   Presentation: cephalic FHT:  Baseline rate 120 bpm   Variability moderate  Accelerations present   Decelerations none Contractions: Every 1-4 mins   Pertinent Labs/Studies:   Results for orders placed or performed during the hospital encounter of 07/26/20 (from the past 24 hour(s))  CBC     Status: None   Collection Time: 07/26/20 12:26 AM  Result Value Ref Range   WBC 6.8 4.0 - 10.5 K/uL   RBC 4.39 3.87 - 5.11 MIL/uL   Hemoglobin 12.6 12.0 - 15.0 g/dL   HCT 36.5 36.0 - 46.0 %  MCV 83.1 80.0 - 100.0 fL   MCH 28.7 26.0 - 34.0 pg   MCHC 34.5 30.0 - 36.0 g/dL   RDW 13.1 11.5 - 15.5 %   Platelets 215 150 - 400 K/uL   nRBC 0.0 0.0 - 0.2 %  Type and screen     Status: None   Collection Time: 07/26/20 12:26 AM  Result Value Ref Range   ABO/RH(D) A POS    Antibody Screen NEG    Sample Expiration      07/29/2020,2359 Performed at [facility], [address] [place], [place] [address]   RPR     Status: None   Collection Time: 07/26/20 12:26 AM  Result Value Ref Range   RPR Ser Ql NON REACTIVE NON REACTIVE  Glucose, capillary     Status: Abnormal   Collection Time: 07/26/20  5:03 AM  Result Value Ref Range   Glucose-Capillary 108 (H) 70 - 99 mg/dL  Glucose, capillary     Status: Abnormal   Collection Time: 07/26/20  9:17 AM  Result Value Ref Range   Glucose-Capillary 120 (H) 70 - 99 mg/dL  Glucose, capillary     Status: Abnormal   Collection Time: 07/26/20  2:06 PM  Result Value Ref Range   Glucose-Capillary 116 (H) 70 - 99 mg/dL    Assessment : Stacy Soto is a 20 y.o. G1P0000 at 20w1dbeing admitted for induction of labor due to cHTN on Labetalol, GDM (on Glyburide), obesity in pregnancy.  Plan: Labor: Induction as ordered as per protocol.  [facility] Additional dose of  Cytotec placed. Analgesia as needed.  Continue glucose monitoring per protocol. BPs controlled with PO Labetalol.  FWB: Reassuring fetal heart tracing.  GBS negative Delivery plan: Hopeful for vaginal delivery    CRubie Maid MD [facility] 07/26/2020 3:10 PM

## 2020-07-26 NOTE — Progress Notes (Signed)
Intrapartum Progress Note  S: Patient without major complaints. Foley bulb expelled ~ 1 hr ago. Has been up ambulating and in the shower.  O: Blood pressure (!) 155/87, pulse 62, temperature 98.8 F (37.1 C), temperature source Oral, resp. rate 16, height 5\' 6"  (1.676 m), weight 117.9 kg, last menstrual period 11/09/2019, unknown if currently breastfeeding. Gen App: NAD,mildly uncomfortable with contractions Abdomen: soft, gravid FHT: baseline 120 bpm.  Accels present.  Decels absent. Moderate in degree variability.   Tocometer: contractions difficult to detect due to body habitus and positioning. Sometimes noted between 2-4 minutes.  Cervix: external os 6-7 cm, internal os 4-5/70/-3. Extremities: Nontender, no edema.  Pitocin: None  Labs:  Results for JAELYN, BOURGOIN (MRN Jacelyn Grip) as of 07/26/2020 20:47  Ref. Range 07/26/2020 14:06 07/26/2020 18:28  Glucose-Capillary Latest Ref Range: 70 - 99 mg/dL 09/25/2020 (H) 250 (H)    Assessment:  1: SIUP at [redacted]w[redacted]d 2. CHTN 3. GDM, Class A2 4. Morbid obesity in pregnancy  Plan:  1. AROM'd with copious blood tinged fluid. IUPC and FSE placed for better monitoring of contractions and fetus as has been difficult at times due to body habitus.  2. Will initiate Pitocin 3. Patient desires epidural.  Anesthesia notified.  4. GDM, was managed with Glyburide.  Continue to monitor blood sugars, to begin glucose stabilizer protocol if blood sugars above 140.  5. CHTN, on Labetalol. BP elevated, however will reassess after epidural placement.  6. Anticipate vaginal delivery.    [redacted]w[redacted]d, MD 07/26/2020 8:36 PM

## 2020-07-26 NOTE — Anesthesia Procedure Notes (Signed)
Epidural Patient location during procedure: OB Start time: 07/26/2020 8:32 PM End time: 07/26/2020 8:56 PM  Staffing Performed: anesthesiologist   Preanesthetic Checklist Completed: patient identified, IV checked, site marked, risks and benefits discussed, surgical consent, monitors and equipment checked, pre-op evaluation and timeout performed  Epidural Patient position: sitting Prep: Betadine Patient monitoring: heart rate, continuous pulse ox and blood pressure Approach: midline Location: L4-L5 Injection technique: LOR saline  Needle:  Needle type: Tuohy  Needle gauge: 17 G Needle length: 9 cm and 9 Needle insertion depth: 9 cm Catheter type: closed end flexible Catheter size: 20 Guage Catheter at skin depth: 14 cm Test dose: negative and 1.5% lidocaine with Epi 1:200 K  Assessment Events: blood not aspirated, injection not painful, no injection resistance, no paresthesia and negative IV test  Additional Notes   Patient tolerated the insertion well without complications.Reason for block:procedure for pain

## 2020-07-27 ENCOUNTER — Encounter: Payer: Self-pay | Admitting: Obstetrics and Gynecology

## 2020-07-27 DIAGNOSIS — O99214 Obesity complicating childbirth: Secondary | ICD-10-CM | POA: Diagnosis not present

## 2020-07-27 DIAGNOSIS — Z3A37 37 weeks gestation of pregnancy: Secondary | ICD-10-CM

## 2020-07-27 DIAGNOSIS — E669 Obesity, unspecified: Secondary | ICD-10-CM | POA: Diagnosis not present

## 2020-07-27 DIAGNOSIS — O1002 Pre-existing essential hypertension complicating childbirth: Secondary | ICD-10-CM | POA: Diagnosis not present

## 2020-07-27 DIAGNOSIS — O24415 Gestational diabetes mellitus in pregnancy, controlled by oral hypoglycemic drugs: Secondary | ICD-10-CM

## 2020-07-27 LAB — GLUCOSE, CAPILLARY
Glucose-Capillary: 120 mg/dL — ABNORMAL HIGH (ref 70–99)
Glucose-Capillary: 139 mg/dL — ABNORMAL HIGH (ref 70–99)
Glucose-Capillary: 127 mg/dL — ABNORMAL HIGH (ref 70–99)
Glucose-Capillary: 100 mg/dL — ABNORMAL HIGH (ref 70–99)

## 2020-07-27 MED ORDER — GLYBURIDE 5 MG PO TABS
2.5000 mg | ORAL_TABLET | Freq: Every day | ORAL | Status: DC
  Administered 2020-07-28 – 2020-07-29 (×2): 2.5 mg via ORAL
  Filled 2020-07-27: qty 1
  Filled 2020-07-27 (×3): qty 0.5

## 2020-07-27 MED ORDER — COCONUT OIL OIL
1.0000 "application " | TOPICAL_OIL | Status: DC | PRN
  Administered 2020-07-27: 1 via TOPICAL
  Filled 2020-07-27: qty 120

## 2020-07-27 MED ORDER — ONDANSETRON HCL 4 MG/2ML IJ SOLN: 4.0000 mg | INTRAMUSCULAR | Status: DC | PRN

## 2020-07-27 MED ORDER — WITCH HAZEL-GLYCERIN EX PADS
1.0000 "application " | MEDICATED_PAD | CUTANEOUS | Status: DC | PRN
  Administered 2020-07-27: 1 via TOPICAL
  Filled 2020-07-27: qty 100

## 2020-07-27 MED ORDER — DIPHENHYDRAMINE HCL 25 MG PO CAPS: 25.0000 mg | ORAL_CAPSULE | Freq: Four times a day (QID) | ORAL | Status: DC | PRN

## 2020-07-27 MED ORDER — PRENATAL MULTIVITAMIN CH
1.0000 | ORAL_TABLET | Freq: Every day | ORAL | Status: DC
  Administered 2020-07-28 – 2020-07-29 (×2): 1 via ORAL
  Filled 2020-07-27 (×2): qty 1

## 2020-07-27 MED ORDER — SIMETHICONE 80 MG PO CHEW: 80.0000 mg | CHEWABLE_TABLET | ORAL | Status: DC | PRN

## 2020-07-27 MED ORDER — BENZOCAINE-MENTHOL 20-0.5 % EX AERO
1.0000 "application " | INHALATION_SPRAY | CUTANEOUS | Status: DC | PRN
  Administered 2020-07-27: 1 via TOPICAL
  Filled 2020-07-27 (×2): qty 56

## 2020-07-27 MED ORDER — SENNOSIDES-DOCUSATE SODIUM 8.6-50 MG PO TABS
2.0000 | ORAL_TABLET | Freq: Every day | ORAL | Status: DC
  Administered 2020-07-28 – 2020-07-29 (×2): 2 via ORAL
  Filled 2020-07-27 (×3): qty 2

## 2020-07-27 MED ORDER — IBUPROFEN 800 MG PO TABS
800.0000 mg | ORAL_TABLET | Freq: Four times a day (QID) | ORAL | Status: DC
  Administered 2020-07-27 – 2020-07-28 (×4): 800 mg via ORAL
  Filled 2020-07-27 (×4): qty 1

## 2020-07-27 MED ORDER — ACETAMINOPHEN 325 MG PO TABS
650.0000 mg | ORAL_TABLET | ORAL | Status: DC | PRN
  Administered 2020-07-27 – 2020-07-28 (×4): 650 mg via ORAL
  Filled 2020-07-27 (×4): qty 2

## 2020-07-27 MED ORDER — ZOLPIDEM TARTRATE 5 MG PO TABS: 5.0000 mg | ORAL_TABLET | Freq: Every evening | ORAL | Status: DC | PRN

## 2020-07-27 MED ORDER — ONDANSETRON HCL 4 MG PO TABS
4.0000 mg | ORAL_TABLET | ORAL | Status: DC | PRN
  Filled 2020-07-27: qty 1

## 2020-07-27 MED ORDER — PRENATAL MULTIVITAMIN CH
ORAL_TABLET | ORAL | Status: AC
  Filled 2020-07-27: qty 1

## 2020-07-27 MED ORDER — DIBUCAINE (PERIANAL) 1 % EX OINT: 1.0000 "application " | TOPICAL_OINTMENT | CUTANEOUS | Status: DC | PRN

## 2020-07-27 NOTE — Progress Notes (Signed)
Stacy Soto is stable after delivery. Pt's support person is at bedside. Pt bonding with infant and performing skin to skin after delivery. Epidural catheter removed by RN, tip intact, no bleeding noted at site. Pt is stable, ambulated to the bathroom, voided, and tolerated activity. Pt transferred to mother/baby unit RM 341 . Report given to Fernande Boyden ,RN

## 2020-07-27 NOTE — Lactation Note (Signed)
This note was copied from a baby's chart. Lactation Consultation Note  Patient Name: Stacy Soto [id number] Date: 07/27/2020 Reason for consult: Initial assessment Age:20 hours Stacy Soto was born at 37.2 weeks.  She has been a little sleepy.  Mom can easily hand express colostrum into an 11 ml vial and is giving via curved tip syringe in the tip of the nipple shield and along side of the nipple shield.  Positioned Stacy in cradle hold on the right breast and football hold on the left breast.  Stacy Soto seemed more comfortable in the football hold and sustained the latch better in that position and with #20 nipple shield rather than #24 nipple shield.  Demonstrated how to massage breast and gently stimulate baby to keep her actively sucking at the breast. Mom's nipples are slightly tender.  No trauma noted.  Coconut oil given with instructions in use.  Hand out given on what to expect with breast feeding the first 4 days of life and reviewed normal newborn stomach size, normal course of lactation, supply and demand, feeding cues, adequate intake and out put and routine newborn feeding patterns.  Lactation Government social research officer given and discussed support groups, web sites and contact numbers.  Lactation name and number written on white board and encouraged to call with any questions, concerns or assistance. Maternal Data Has patient been taught Hand Expression?: Yes Does the patient have breastfeeding experience prior to this delivery?: No (First baby)  Feeding Mother's Current Feeding Choice: Breast Milk  LATCH Score Latch: Repeated attempts needed to sustain latch, nipple held in mouth throughout feeding, stimulation needed to elicit sucking reflex.  Audible Swallowing: A few with stimulation  Type of Nipple: Everted at rest and after stimulation  Comfort (Breast/Nipple): Soft / non-tender  Hold (Positioning): Assistance needed to correctly position infant at breast and maintain  latch.  LATCH Score: 7   Lactation Tools Discussed/Used Nipple shield size: 24  Interventions Interventions: Breast feeding basics reviewed;Assisted with latch;Skin to skin;Breast massage;Hand express;Pre-pump if needed;Reverse pressure;Breast compression;Adjust position;Support pillows;Position options;Expressed milk;Coconut oil;Education  Discharge WIC Program: Yes  Consult Status Consult Status: Follow-up Follow-up type: Call as needed    Louis Meckel 07/27/2020, 10:57 PM

## 2020-07-28 LAB — CBC
HCT: 28.9 % — ABNORMAL LOW (ref 36.0–46.0)
Hemoglobin: 9.6 g/dL — ABNORMAL LOW (ref 12.0–15.0)
MCH: 28.9 pg (ref 26.0–34.0)
MCHC: 33.2 g/dL (ref 30.0–36.0)
MCV: 87 fL (ref 80.0–100.0)
Platelets: 170 10*3/uL (ref 150–400)
RBC: 3.32 MIL/uL — ABNORMAL LOW (ref 3.87–5.11)
RDW: 13.7 % (ref 11.5–15.5)
WBC: 9.7 10*3/uL (ref 4.0–10.5)
nRBC: 0 % (ref 0.0–0.2)

## 2020-07-28 LAB — GLUCOSE, CAPILLARY
Glucose-Capillary: 164 mg/dL — ABNORMAL HIGH (ref 70–99)
Glucose-Capillary: 72 mg/dL (ref 70–99)
Glucose-Capillary: 87 mg/dL (ref 70–99)
Glucose-Capillary: 95 mg/dL (ref 70–99)

## 2020-07-28 MED ORDER — IBUPROFEN 800 MG PO TABS
800.0000 mg | ORAL_TABLET | Freq: Four times a day (QID) | ORAL | Status: DC
  Administered 2020-07-28 – 2020-07-29 (×6): 800 mg via ORAL
  Filled 2020-07-28 (×7): qty 1

## 2020-07-28 NOTE — Progress Notes (Addendum)
Post Partum Day # 1, s/p SVD  Subjective: no complaints, up ad lib, voiding and tolerating PO  Objective: Temp:  [98 F (36.7 C)-98.5 F (36.9 C)] 98 F (36.7 C) (05/03 0824) Pulse Rate:  [62-98] 70 (05/03 0824) Resp:  [18] 18 (05/03 0824) BP: (118-146)/(73-87) 118/73 (05/03 0824) SpO2:  [98 %-100 %] 100 % (05/03 0824)  Physical Exam:  General: alert and no distress  Lungs: clear to auscultation bilaterally Breasts: normal appearance, no masses or tenderness Heart: regular rate and rhythm, S1, S2 normal, no murmur, click, rub or gallop Abdomen: soft, non-tender; bowel sounds normal; no masses,  no organomegaly Pelvis: Lochia: appropriate, Uterine Fundus: firm Extremities: DVT Evaluation: No evidence of DVT seen on physical exam. Negative Homan's sign. No cords or calf tenderness. No significant calf/ankle edema.    Recent Labs    07/26/20 0026 07/28/20 0538  HGB 12.6 9.6*  HCT 36.5 28.9*    Assessment/Plan: Patient doing well, no major issues Anemia postpartum. No significant blood loss noted at delivery, may be dilutional. Asymptomatic. Breastfeeding, Lactation consult  Contraception undecided but may consider non-hormonal method.  GDM on Glyburide, continued on med while inpatient as blood sugars have been borderline elevated. Will continue to encourage to check blood sugars occasionally while at home.  Rubella and Varicella non-immune, for vaccination postpartum.  Dispo: possible d/c home today or in a.m.    LOS: 2 days   Hildred Laser, MD [facility] 07/28/2020 9:37 AM

## 2020-07-28 NOTE — Lactation Note (Signed)
This note was copied from a baby's chart. Lactation Consultation Note  Patient Name: Stacy Soto [id number] Date: 07/28/2020 Reason for consult: Follow-up assessment;Early term 37-38.6wks;Primapara Age:20 hours  LC Student helped mother get set up with a DEBP. Mother was instructed how to properly set up the parts, properly clean the parts, and properly store the expressed milk. Mother was also instructed on how to properly use the pump and pumping cycles. <Mother was advised to pump every 2-3 hours and feed to the infant due to high bilirubin levels. Mother stated she understood and did not have any questions.   Maternal Data Has patient been taught Hand Expression?: Yes Does the patient have breastfeeding experience prior to this delivery?: No  Feeding Mother's Current Feeding Choice: Breast Milk   Discharge Discharge Education: Engorgement and breast care;Warning signs for feeding baby Pump: Personal   Stacy Soto 07/28/2020, 11:29 AM

## 2020-07-28 NOTE — Progress Notes (Signed)
Patient's POCT CBG this morning was 164. This was taken after patient ate breakfast (chocolate milk, french toast, and sausage). Patient re-education to call RN prior to meals, pt verbalized understanding.

## 2020-07-28 NOTE — Anesthesia Postprocedure Evaluation (Signed)
Anesthesia Post Note  Patient: [name]  Procedure(s) Performed: AN AD HOC LABOR EPIDURAL  Patient location during evaluation: Mother Baby Anesthesia Type: Epidural Level of consciousness: awake and alert Pain management: pain level controlled Vital Signs Assessment: post-procedure vital signs reviewed and stable Respiratory status: spontaneous breathing, nonlabored ventilation and respiratory function stable Cardiovascular status: stable Postop Assessment: no headache, no backache and epidural receding Anesthetic complications: no   No complications documented.   Last Vitals:  Vitals:   07/28/20 0017 07/28/20 0824  BP: (!) 146/87 118/73  Pulse: 75 70  Resp:  18  Temp:  36.7 C  SpO2:  100%    Last Pain:  Vitals:   07/28/20 1222  TempSrc:   PainSc: 7                  Stacy Soto

## 2020-07-29 ENCOUNTER — Encounter: Payer: Self-pay | Admitting: Obstetrics and Gynecology

## 2020-07-29 LAB — GLUCOSE, CAPILLARY
Glucose-Capillary: 86 mg/dL (ref 70–99)
Glucose-Capillary: 100 mg/dL — ABNORMAL HIGH (ref 70–99)
Glucose-Capillary: 87 mg/dL (ref 70–99)
Glucose-Capillary: 93 mg/dL (ref 70–99)

## 2020-07-29 LAB — SURGICAL PATHOLOGY

## 2020-07-29 MED ORDER — IBUPROFEN 600 MG PO TABS: 600.0000 mg | ORAL_TABLET | Freq: Four times a day (QID) | ORAL | 1 refills | Status: DC | PRN

## 2020-07-29 MED ORDER — MEASLES, MUMPS & RUBELLA VAC IJ SOLR
0.5000 mL | Freq: Once | INTRAMUSCULAR | Status: DC
  Filled 2020-07-29: qty 0.5

## 2020-07-29 MED ORDER — VARICELLA VIRUS VACCINE LIVE 1350 PFU/0.5ML IJ SUSR
0.5000 mL | Freq: Once | INTRAMUSCULAR | Status: DC
  Filled 2020-07-29: qty 0.5

## 2020-07-29 NOTE — Discharge Instructions (Signed)

## 2020-07-29 NOTE — Lactation Note (Signed)
This note was copied from a baby's chart. Lactation Consultation Note  Patient Name: Stacy Soto [id number] Date: 07/29/2020 Reason for consult: Follow-up assessment;Early term 37-38.6wks;Hyperbilirubinemia Age:20 hours  Lactation follow-up. Baby developed hyperbilirubinemia and has started phototherapy. Mom was set-up with DEBP yesterday, however receiving low output. Donor milk was given as supplement, but baby had large volumes of spit-up post bottle feedings and has changed to formula this morning.  Mom was encouraged to pump every 3 hours, however her pumping routine has varied, but was encouraged again today about importance of frequent stimulation and consistency with pumping to aid in milk supply establishment.  Current feeding plan: put baby to breast + formula supplement every 3 hours. Mom notes that she may consider only formula feeding, as she feels overwhelmed with breastfeeding and pumping. LC validated mom's feelings, but encouraged her to continue providing baby with her breastmilk until she makes a final decision; mom agrees.  Lactation to follow-up as needed.  Maternal Data Has patient been taught Hand Expression?: Yes Does the patient have breastfeeding experience prior to this delivery?: No  Feeding Mother's Current Feeding Choice: Breast Milk and Formula  LATCH Score                    Lactation Tools Discussed/Used Tools: Pump;Nipple Dorris Carnes;Bottle Nipple shield size: 20 Breast pump type: Double-Electric Breast Pump Pump Education: Setup, frequency, and cleaning;Milk Storage Reason for Pumping: hyperbilirubinemia Pumping frequency: q 3hours  Interventions Interventions: Breast feeding basics reviewed;Education  Discharge WIC Program: Yes  Consult Status Consult Status: PRN Date: 07/29/20 Follow-up type: Call as needed    Danford Bad 07/29/2020, 9:40 AM

## 2020-07-29 NOTE — Discharge Summary (Signed)
OB Discharge Summary     Patient Name: Stacy Soto DOB: [birth year] MRN: [id number]  Date of admission: 07/26/2020 Delivering MD: Rubie Maid   Date of discharge: 07/29/2020  Admitting diagnosis: Chronic hypertension in pregnancy [O10.919] Intrauterine pregnancy: [redacted]w[redacted]d    Secondary diagnosis:  Active Problems:   Heart murmur   Rubella non-immune status, antepartum   Susceptible to varicella (non-immune), currently pregnant   Obesity in pregnancy   Gestational diabetes mellitus (GDM) affecting first pregnancy   Chronic hypertension in pregnancy  Additional problems: history of anxiety and depression (no meds)     Discharge diagnosis: Term Pregnancy Delivered, CHTN, GDM A2 and Anemia                                                                                                Post partum procedures:None  Augmentation: AROM, Pitocin, Cytotec and IP Foley  Complications: None  Hospital course:  Induction of Labor With Vaginal Delivery   20y.o. yo G1P1001 at 373w2das admitted to the hospital 07/26/2020 for induction of labor.  Indication for induction: A2 DM and Chronic Hypertension.  Patient had an uncomplicated labor course as follows: Membrane Rupture Time/Date: 8:28 PM ,07/26/2020   Delivery Method:Vaginal, Spontaneous  Episiotomy: None  Lacerations:  Vaginal  Details of delivery can be found in separate delivery note.  Patient had a routine postpartum course. Patient is discharged home 07/29/20.  Newborn Data: Birth date:07/27/2020  Birth time:6:30 AM  Gender:Female  Living status:Living  Apgars:8 ,9  Weight:2770 g   Physical exam  Vitals:   07/28/20 1606 07/28/20 2226 07/29/20 0418 07/29/20 0801  BP: 129/77 (!) 148/93 138/85 (!) 142/89  Pulse: 89 81 74 83  Resp: '20 20 20 20  ' Temp: 98 F (36.7 C) 98.2 F (36.8 C) 97.9 F (36.6 C) 97.7 F (36.5 C)  TempSrc:  Oral Oral Oral  SpO2: 99% 99% 100%   Weight:      Height:       General: alert, cooperative and  no distress Lochia: appropriate Uterine Fundus: firm Incision: N/A DVT Evaluation: No evidence of DVT seen on physical exam. Negative Homan's sign. No cords or calf tenderness. No significant calf/ankle edema.   Labs: Lab Results  Component Value Date   WBC 9.7 07/28/2020   HGB 9.6 (L) 07/28/2020   HCT 28.9 (L) 07/28/2020   MCV 87.0 07/28/2020   PLT 170 07/28/2020   CMP Latest Ref Rng & Units 04/22/2020  Glucose 65 - 99 mg/dL 108(H)  BUN 6 - 20 mg/dL 5(L)  Creatinine 0.57 - 1.00 mg/dL 0.45(L)  Sodium 134 - 144 mmol/L 138  Potassium 3.5 - 5.2 mmol/L 4.3  Chloride 96 - 106 mmol/L 107(H)  CO2 20 - 29 mmol/L 18(L)  Calcium 8.7 - 10.2 mg/dL 8.9  Total Protein 6.0 - 8.5 g/dL -  Total Bilirubin 0.0 - 1.2 mg/dL -  Alkaline Phos 42 - 106 IU/L -  AST 0 - 40 IU/L -  ALT 0 - 32 IU/L -     Edinburgh Postnatal Depression Scale Screening Tool 07/28/2020  I have  been able to laugh and see the funny side of things. 0  I have looked forward with enjoyment to things. 0  I have blamed myself unnecessarily when things went wrong. 1  I have been anxious or worried for no good reason. 2  I have felt scared or panicky for no good reason. 0  Things have been getting on top of me. 0  I have been so unhappy that I have had difficulty sleeping. 0  I have felt sad or miserable. 0  I have been so unhappy that I have been crying. 1  The thought of harming myself has occurred to me. 0  Edinburgh Postnatal Depression Scale Total 4      Discharge instruction: per After Visit Summary and "Baby and Me Booklet".  After visit meds:  Allergies as of 07/29/2020   No Known Allergies     Medication List    STOP taking these medications   aspirin EC 81 MG tablet   glyBURIDE 2.5 MG tablet Commonly known as: DIABETA     TAKE these medications   Accu-Chek FastClix Lancets Misc SMARTSIG:1 Topical 4 Times Daily   Accu-Chek Guide test strip Generic drug: glucose blood USE 1 UP TO 4 TIMES DAILY    blood glucose meter kit and supplies Kit Dispense based on patient and insurance preference. Use up to four times daily as directed. (FOR ICD-9 250.00, 250.01).   ibuprofen 600 MG tablet Commonly known as: ADVIL Take 1 tablet (600 mg total) by mouth every 6 (six) hours as needed.   labetalol 200 MG tablet Commonly known as: NORMODYNE Take 1 tablet (200 mg total) by mouth 2 (two) times daily.   prenatal multivitamin Tabs tablet Take 1 tablet by mouth daily at 12 noon.       Diet: carb modified diet  Activity: Advance as tolerated. Pelvic rest for 6 weeks.   Outpatient follow up:6 weeks for postpartum visit. Also for 1 week BP check and 2 week postpartum mood check (televisit) Follow up Appt:No future appointments. Follow up Visit:No follow-ups on file.  Postpartum contraception: Undecided  Newborn Data: Live born female  Birth Weight: 6 lb 1.7 oz (2770 g) APGAR: 21, 9  Newborn Delivery   Birth date/time: 07/27/2020 06:30:00 Delivery type: Vaginal, Spontaneous      Baby Feeding: Breast Disposition:rooming in (for jaundice management)   07/29/2020 Rubie Maid, MD

## 2020-08-04 ENCOUNTER — Encounter: Payer: Self-pay | Admitting: Obstetrics and Gynecology

## 2020-08-04 ENCOUNTER — Ambulatory Visit (INDEPENDENT_AMBULATORY_CARE_PROVIDER_SITE_OTHER): Payer: Medicaid Other | Admitting: Obstetrics and Gynecology

## 2020-08-04 ENCOUNTER — Other Ambulatory Visit: Payer: Self-pay

## 2020-08-04 VITALS — BP 118/89 | HR 81 | Ht 66.0 in | Wt 243.5 lb

## 2020-08-04 DIAGNOSIS — Z0130 Encounter for examination of blood pressure without abnormal findings: Secondary | ICD-10-CM

## 2020-08-04 DIAGNOSIS — Z8632 Personal history of gestational diabetes: Secondary | ICD-10-CM

## 2020-08-04 DIAGNOSIS — O10919 Unspecified pre-existing hypertension complicating pregnancy, unspecified trimester: Secondary | ICD-10-CM

## 2020-08-04 DIAGNOSIS — O9081 Anemia of the puerperium: Secondary | ICD-10-CM

## 2020-08-04 NOTE — Progress Notes (Signed)
Pt present for bp check. Pt stated that she is doing well and taking her bp medication twice daily as prescribed.  EPDS=3.

## 2020-08-04 NOTE — Progress Notes (Signed)
OBSTETRICS POSTPARTUM CLINIC PROGRESS NOTE  Subjective:     Stacy Soto is a 20 y.o. G62P1001 female who presents for a 1 week postpartum visit. She is 1 week postpartum following a spontaneous vaginal delivery. I have fully reviewed the prenatal and intrapartum course. Pregnancy was complicated by obesity, GDM (on Glyburide) and cHTN (on Labetalol). The delivery was at 40 gestational weeks.  Anesthesia: epidural. Postpartum course has been well. Baby's course has been well. Baby is feeding by breast (pumping), has issues with cluster feeding. Bleeding: patient has not resumed menses, with No LMP recorded. Bowel function is normal. Bladder function is normal. Patient is not sexually active. Postpartum depression screening: negative.   The following portions of the patient's history were reviewed and updated as appropriate: allergies, current medications, past family history, past medical history, past social history, past surgical history and problem list.  Review of Systems Pertinent items noted in HPI and remainder of comprehensive ROS otherwise negative.   Objective:    BP 118/89   Pulse 81   Ht 5\' 6"  (1.676 m)   Wt 243 lb 8 oz (110.5 kg)   BMI 39.30 kg/m   General:  alert and no distress   Breasts:  inspection negative, no nipple discharge or bleeding, no masses or nodularity palpable  Lungs: clear to auscultation bilaterally  Heart:  regular rate and rhythm, S1, S2 normal, no murmur, click, rub or gallop  Abdomen: soft, non-tender; bowel sounds normal; no masses,  no organomegaly.     Vulva:  normal  Vagina: normal vagina, no discharge, exudate, lesion, or erythema  Cervix:  no cervical motion tenderness and no lesions  Corpus: normal size, contour, position, consistency, mobility, non-tender  Adnexa:  normal adnexa and no mass, fullness, tenderness  Rectal Exam: Not performed.       Edinburgh Postnatal Depression Scale - 08/04/20 1047      Edinburgh Postnatal Depression  Scale:  In the Past 7 Days   I have been able to laugh and see the funny side of things. 0    I have looked forward with enjoyment to things. 0    I have blamed myself unnecessarily when things went wrong. 2    I have been anxious or worried for no good reason. 1    I have felt scared or panicky for no good reason. 0    Things have been getting on top of me. 0    I have been so unhappy that I have had difficulty sleeping. 0    I have felt sad or miserable. 0    I have been so unhappy that I have been crying. 0    The thought of harming myself has occurred to me. 0    Edinburgh Postnatal Depression Scale Total 3            Labs:  Lab Results  Component Value Date   HGB 9.6 (L) 07/28/2020     Assessment:   1. BP check   2. Chronic hypertension in pregnancy   3. Postpartum state   4. Spontaneous vaginal delivery   5. Postpartum anemia   6. Lactating mother   7. History of gestational diabetes     Plan:   1. Contraception: undecided 2. Will check Hgb for h/o anemia at 6 week postpartum visit 3. BPs well controlled. Continue Labetalol.  4. Follow up in: 4-5 weeks for final postpartum visit, or sooner as needed.   5. Will need to be  rescreened for DM postpartum.    Hildred Laser, MD [facility]

## 2020-08-11 ENCOUNTER — Encounter: Payer: Medicaid Other | Admitting: Obstetrics and Gynecology

## 2020-08-15 ENCOUNTER — Inpatient Hospital Stay: Admit: 2020-08-15 | Payer: Medicaid Other

## 2020-09-09 ENCOUNTER — Ambulatory Visit (INDEPENDENT_AMBULATORY_CARE_PROVIDER_SITE_OTHER): Payer: Medicaid Other | Admitting: Obstetrics and Gynecology

## 2020-09-09 ENCOUNTER — Other Ambulatory Visit: Payer: Self-pay

## 2020-09-09 ENCOUNTER — Encounter: Payer: Self-pay | Admitting: Obstetrics and Gynecology

## 2020-09-09 DIAGNOSIS — Z3009 Encounter for other general counseling and advice on contraception: Secondary | ICD-10-CM

## 2020-09-09 DIAGNOSIS — E669 Obesity, unspecified: Secondary | ICD-10-CM | POA: Insufficient documentation

## 2020-09-09 DIAGNOSIS — O9081 Anemia of the puerperium: Secondary | ICD-10-CM

## 2020-09-09 DIAGNOSIS — I10 Essential (primary) hypertension: Secondary | ICD-10-CM

## 2020-09-09 DIAGNOSIS — O924 Hypogalactia: Secondary | ICD-10-CM

## 2020-09-09 MED ORDER — PHEXXI 1.8-1-0.4 % VA GEL: 1.0000 | VAGINAL | 11 refills | Status: DC | PRN

## 2020-09-09 NOTE — Patient Instructions (Signed)

## 2020-09-09 NOTE — Progress Notes (Addendum)
   OBSTETRICS POSTPARTUM CLINIC PROGRESS NOTE  Subjective:     Stacy Soto is a 20 y.o. G67P1001 female who presents for a postpartum visit. She is 6 weeks postpartum following a spontaneous vaginal delivery. I have fully reviewed the prenatal and intrapartum course. The delivery was at 38 gestational weeks. Delivery was complicated by cHTN, on Labetalol on GDM (diet-controlled). Anesthesia: epidural. Postpartum course has been well. Baby's course has been well. Baby is feeding by breast (pumping) and formula feeding - Similac Sensitive.  Notes low milk supply despite pumping regularly (only 2-4 ounces), has been increasing water intake, lactation cookies. Bleeding: patient has not resumed menses, with No LMP recorded. Bowel function is normal. Bladder function is normal. Patient is not sexually active. Contraception method desired is  undecided . Postpartum depression screening: negative.  The following portions of the patient's history were reviewed and updated as appropriate: allergies, current medications, past family history, past medical history, past social history, past surgical history, and problem list.  Review of Systems Pertinent items noted in HPI and remainder of comprehensive ROS otherwise negative.   Objective:    BP 135/86   Pulse 81   Resp 16   Ht 5\' 6"  (1.676 m)   Wt 242 lb 1.6 oz (109.8 kg)   BMI 39.08 kg/m   General:  alert and no distress   Breasts:  inspection negative, no nipple discharge or bleeding, no masses or nodularity palpable  Lungs: clear to auscultation bilaterally  Heart:  regular rate and rhythm, S1, S2 normal, no murmur, click, rub or gallop  Abdomen: soft, non-tender; bowel sounds normal; no masses,  no organomegaly.    Vulva:  normal  Vagina: normal vagina, no discharge, exudate, lesion, or erythema  Cervix:  no cervical motion tenderness and no lesions  Corpus: normal size, contour, position, consistency, mobility, non-tender  Adnexa:  normal  adnexa and no mass, fullness, tenderness  Rectal Exam: Not performed.         Labs:  Lab Results  Component Value Date   HGB 9.6 (L) 07/28/2020     Assessment:   1. Postpartum examination following vaginal delivery   2. Postpartum anemia   3. Encounter for counseling regarding contraception   4. Chronic hypertension   5. Decreased milk production     Plan:   1. Contraception: discussed contraception options with patient, ultimately decided on Phexxi.  Will prescribe.  2. Will check Hgb for h/o anemia.  3. BP stable today, continue Labetalol.  Will f/u with PCP.  4. Advised on OTC supplements such as Fenugreek, mother's milk tea.  If still no improvement, can prescribe Reglan. 5. Follow up in: 7 months or as needed.    09/27/2020, MD [facility]

## 2020-09-10 LAB — HEMOGLOBIN AND HEMATOCRIT, BLOOD
Hematocrit: 40.3 % (ref 34.0–46.6)
Hemoglobin: 13.4 g/dL (ref 11.1–15.9)

## 2020-11-05 NOTE — Patient Instructions (Signed)

## 2020-11-06 ENCOUNTER — Ambulatory Visit (INDEPENDENT_AMBULATORY_CARE_PROVIDER_SITE_OTHER): Payer: Medicaid Other | Admitting: Obstetrics and Gynecology

## 2020-11-06 ENCOUNTER — Other Ambulatory Visit: Payer: Self-pay

## 2020-11-06 ENCOUNTER — Encounter: Payer: Self-pay | Admitting: Obstetrics and Gynecology

## 2020-11-06 VITALS — BP 128/77 | HR 89 | Ht 66.0 in | Wt 243.6 lb

## 2020-11-06 DIAGNOSIS — Z30430 Encounter for insertion of intrauterine contraceptive device: Secondary | ICD-10-CM

## 2020-11-06 NOTE — Progress Notes (Signed)
HPI:      [name] is a 20 y.o. G1P1001 who LMP was Patient's last menstrual period was 11/03/2020.  Subjective:   She presents today for IUD insertion.  Prior to IUD insertion we discussed IUDs and cycle control in detail.  Differences between the ParaGard and multiple hormonal IUDs discussed in detail.  All of her questions were answered.  She is decided that she would like the Mirena for birth control and cycle control.    Hx: The following portions of the patient's history were reviewed and updated as appropriate:             She  has a past medical history of Anxiety, Cold, Depression, Gestational diabetes, Heart murmur, Hypertension, IBS (irritable bowel syndrome), Orthodontics, Pregnancy induced hypertension, and PVCs (premature ventricular contractions). She does not have any pertinent problems on file. She  has a past surgical history that includes Adenoidectomy (2005); Tonsillectomy (2005); Septoplasty (N/A, 06/28/2016); Nasal turbinate reduction (Left, 06/28/2016); and Tonsillectomy. Her family history includes Breast cancer in her paternal grandmother; Diabetes in her father and paternal grandfather; Other in her mother. She  reports that she quit smoking about a year ago. Her smoking use included e-cigarettes. She has never used smokeless tobacco. She reports that she does not drink alcohol and does not use drugs. She has a current medication list which includes the following prescription(s): levonorgestrel and prenatal multivitamin. She has No Known Allergies.       Review of Systems:  Review of Systems  Constitutional: Denied constitutional symptoms, night sweats, recent illness, fatigue, fever, insomnia and weight loss.  Eyes: Denied eye symptoms, eye pain, photophobia, vision change and visual disturbance.  Ears/Nose/Throat/Neck: Denied ear, nose, throat or neck symptoms, hearing loss, nasal discharge, sinus congestion and sore throat.  Cardiovascular: Denied  cardiovascular symptoms, arrhythmia, chest pain/pressure, edema, exercise intolerance, orthopnea and palpitations.  Respiratory: Denied pulmonary symptoms, asthma, pleuritic pain, productive sputum, cough, dyspnea and wheezing.  Gastrointestinal: Denied, gastro-esophageal reflux, melena, nausea and vomiting.  Genitourinary: Denied genitourinary symptoms including symptomatic vaginal discharge, pelvic relaxation issues, and urinary complaints.  Musculoskeletal: Denied musculoskeletal symptoms, stiffness, swelling, muscle weakness and myalgia.  Dermatologic: Denied dermatology symptoms, rash and scar.  Neurologic: Denied neurology symptoms, dizziness, headache, neck pain and syncope.  Psychiatric: Denied psychiatric symptoms, anxiety and depression.  Endocrine: Denied endocrine symptoms including hot flashes and night sweats.   Meds:   Current Outpatient Medications on File Prior to Visit  Medication Sig Dispense Refill   levonorgestrel (MIRENA) 20 MCG/DAY IUD 1 each by Intrauterine route once. Inserted 11/06/2020 [name]     Prenatal Vit-Fe Fumarate-FA (PRENATAL MULTIVITAMIN) TABS tablet Take 1 tablet by mouth daily at 12 noon.     No current facility-administered medications on file prior to visit.    Objective:     Vitals:   11/06/20 0851  BP: 128/77  Pulse: 89    Physical examination   Pelvic:   Vulva: Normal appearance.  No lesions.  Vagina: No lesions or abnormalities noted.  Support: Normal pelvic support.  Urethra No masses tenderness or scarring.  Meatus Normal size without lesions or prolapse.  Cervix: Normal appearance.  No lesions.  Anus: Normal exam.  No lesions.  Perineum: Normal exam.  No lesions.        Bimanual   Uterus: Normal size.  Non-tender.  Mobile.  AV.  Adnexae: No masses.  Non-tender to palpation.  Cul-de-sac: Negative for abnormality.   IUD Procedure Pt has read the booklet  and signed the appropriate forms regarding the Mirena IUD.  All of her questions  have been answered.   The cervix was cleansed with betadine solution.  After sounding the uterus and noting the position, the IUD was placed in the usual manner without problem.  The string was cut to the appropriate length.  The patient tolerated the procedure well.          [facility] # = [id number]     Assessment:    G1P1001 Patient Active Problem List   Diagnosis Date Noted   Obesity (BMI 30-39.9) 09/09/2020   Hypertension 03/02/2020   Heart murmur 03/02/2020     1. Encounter for insertion of mirena IUD       Plan:             F/U  Return in about 4 weeks (around 12/04/2020) for return in 4 weeks for IUD string check with [name], For IUD f/u. I spent 12 additional minutes involved in the care of this patient discussing IUD differences in the risks and benefits of each type of IUD to help her differentiate and choose the one that is best for her.  I also spent additional time preparing to see the patient by obtaining and reviewing her medical history (including labs, imaging tests and prior procedures), documenting clinical information in the electronic health record (EHR), counseling and coordinating care plans, writing and sending prescriptions, ordering tests or procedures and in direct communicating with the patient and medical staff discussing pertinent items from her history and physical exam.  Elonda Husky, M.D. 11/06/2020 12:45 PM

## 2020-12-04 ENCOUNTER — Ambulatory Visit (INDEPENDENT_AMBULATORY_CARE_PROVIDER_SITE_OTHER): Payer: Medicaid Other | Admitting: Obstetrics and Gynecology

## 2020-12-04 ENCOUNTER — Other Ambulatory Visit: Payer: Self-pay

## 2020-12-04 ENCOUNTER — Encounter: Payer: Self-pay | Admitting: Obstetrics and Gynecology

## 2020-12-04 VITALS — BP 136/80 | HR 75 | Ht 66.0 in | Wt 249.0 lb

## 2020-12-04 DIAGNOSIS — Z30431 Encounter for routine checking of intrauterine contraceptive device: Secondary | ICD-10-CM | POA: Diagnosis not present

## 2020-12-04 NOTE — Progress Notes (Signed)
HPI:      [name] is a 20 y.o. G1P1001 who LMP was No LMP recorded. (Menstrual status: IUD).  Subjective:   She presents today for follow-up of her IUD.  She reports that she has been having some spotting and brown discharge almost every day.  She says it is very light. She has had intercourse since placement of the IUD and reports that her partner can often feel the strings.  She would like them shortened if possible.    Hx: The following portions of the patient's history were reviewed and updated as appropriate:             She  has a past medical history of Anxiety, Cold, Depression, Gestational diabetes, Heart murmur, Hypertension, IBS (irritable bowel syndrome), Orthodontics, Pregnancy induced hypertension, and PVCs (premature ventricular contractions). She does not have any pertinent problems on file. She  has a past surgical history that includes Adenoidectomy (2005); Tonsillectomy (2005); Septoplasty (N/A, 06/28/2016); Nasal turbinate reduction (Left, 06/28/2016); and Tonsillectomy. Her family history includes Breast cancer in her paternal grandmother; Diabetes in her father and paternal grandfather; Other in her mother. She  reports that she quit smoking about 13 months ago. Her smoking use included e-cigarettes. She has never used smokeless tobacco. She reports that she does not drink alcohol and does not use drugs. She has a current medication list which includes the following prescription(s): levonorgestrel and prenatal multivitamin. She has No Known Allergies.       Review of Systems:  Review of Systems  Constitutional: Denied constitutional symptoms, night sweats, recent illness, fatigue, fever, insomnia and weight loss.  Eyes: Denied eye symptoms, eye pain, photophobia, vision change and visual disturbance.  Ears/Nose/Throat/Neck: Denied ear, nose, throat or neck symptoms, hearing loss, nasal discharge, sinus congestion and sore throat.  Cardiovascular: Denied  cardiovascular symptoms, arrhythmia, chest pain/pressure, edema, exercise intolerance, orthopnea and palpitations.  Respiratory: Denied pulmonary symptoms, asthma, pleuritic pain, productive sputum, cough, dyspnea and wheezing.  Gastrointestinal: Denied, gastro-esophageal reflux, melena, nausea and vomiting.  Genitourinary: Denied genitourinary symptoms including symptomatic vaginal discharge, pelvic relaxation issues, and urinary complaints.  Musculoskeletal: Denied musculoskeletal symptoms, stiffness, swelling, muscle weakness and myalgia.  Dermatologic: Denied dermatology symptoms, rash and scar.  Neurologic: Denied neurology symptoms, dizziness, headache, neck pain and syncope.  Psychiatric: Denied psychiatric symptoms, anxiety and depression.  Endocrine: Denied endocrine symptoms including hot flashes and night sweats.   Meds:   Current Outpatient Medications on File Prior to Visit  Medication Sig Dispense Refill   levonorgestrel (MIRENA) 20 MCG/DAY IUD 1 each by Intrauterine route once. Inserted 11/06/2020 [name]     Prenatal Vit-Fe Fumarate-FA (PRENATAL MULTIVITAMIN) TABS tablet Take 1 tablet by mouth daily at 12 noon.     No current facility-administered medications on file prior to visit.      Objective:     Vitals:   12/04/20 1059  BP: 136/80  Pulse: 75   Filed Weights   12/04/20 1059  Weight: 249 lb (112.9 kg)              Physical examination   Pelvic:   Vulva: Normal appearance.  No lesions.  Vagina: No lesions or abnormalities noted.  Support: Normal pelvic support.  Urethra No masses tenderness or scarring.  Meatus Normal size without lesions or prolapse.  Cervix: Normal appearance.  No lesions. IUD strings noted at cervical os.  IUD strings shortened.  Anus: Normal exam.  No lesions.  Perineum: Normal exam.  No lesions.  Bimanual   Uterus: Normal size.  Non-tender.  Mobile.  AV.  Adnexae: No masses.  Non-tender to palpation.  Cul-de-sac: Negative for  abnormality.             Assessment:    G1P1001 Patient Active Problem List   Diagnosis Date Noted   Obesity (BMI 30-39.9) 09/09/2020   Hypertension 03/02/2020   Heart murmur 03/02/2020     1. IUD check up   2. Surveillance of previously prescribed intrauterine contraceptive device     Patient doing well with IUD.  I suspect that her bleeding/spotting will resolve in the next few weeks.   Plan:            1.  Expectant management of IUD.   Orders No orders of the defined types were placed in this encounter.   No orders of the defined types were placed in this encounter.     F/U  No follow-ups on file. I spent 21 minutes involved in the care of this patient preparing to see the patient by obtaining and reviewing her medical history (including labs, imaging tests and prior procedures), documenting clinical information in the electronic health record (EHR), counseling and coordinating care plans, writing and sending prescriptions, ordering tests or procedures and in direct communicating with the patient and medical staff discussing pertinent items from her history and physical exam.  Stacy Soto, M.D. 12/04/2020 11:25 AM

## 2020-12-04 NOTE — Progress Notes (Signed)
Pt present for IUD check up. Pt stated that she was doing well.  

## 2020-12-14 IMAGING — US US OB < 14 WKS - US OB TV - US DOPPLER
1 series · 13 of 28 positions shown · non-contrast
Comparison: 05/11/2019 pelvic sonogram.

CLINICAL DATA: 19-year-old pregnant female with pelvic pain.
Quantitative beta HCG [DATE].

EDC by LMP: 08/15/2020, projecting to an expected gestational age of
5 weeks 1 day.
EXAM:
OBSTETRIC <14 WK US AND TRANSVAGINAL OB
US DOPPLER ULTRASOUND OF OVARIES
TECHNIQUE: Both transabdominal and transvaginal ultrasound examinations were
performed for complete evaluation of the gestation as well as the
maternal uterus, adnexal regions, and pelvic cul-de-sac.
Transvaginal technique was performed to assess early pregnancy.
Color and duplex Doppler ultrasound was utilized to evaluate blood
flow to the ovaries.

[Series 1: ob us · 13 of 74 slices shown]
[im 3/74]
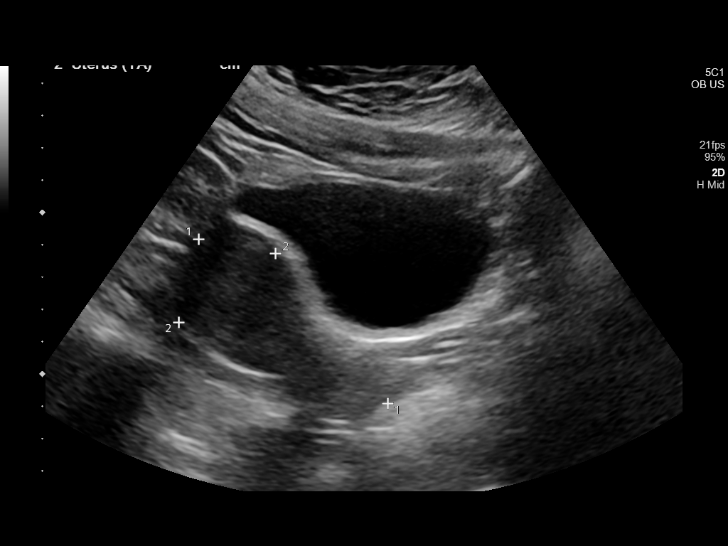
[im 9/74]
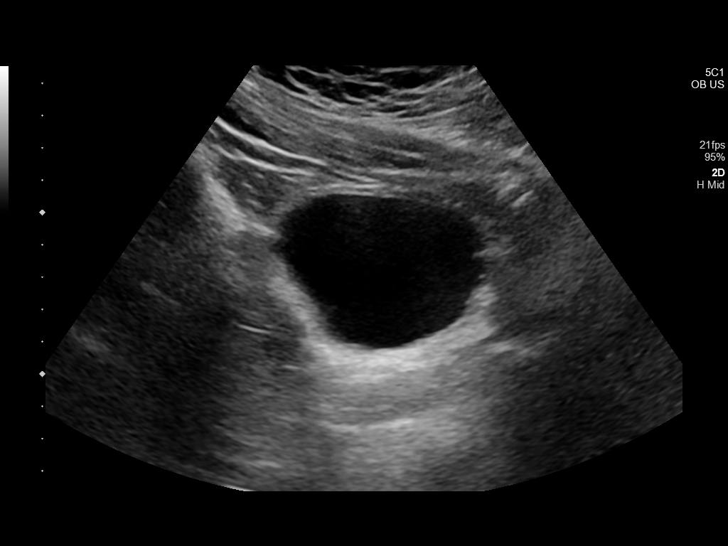
[im 14/74]
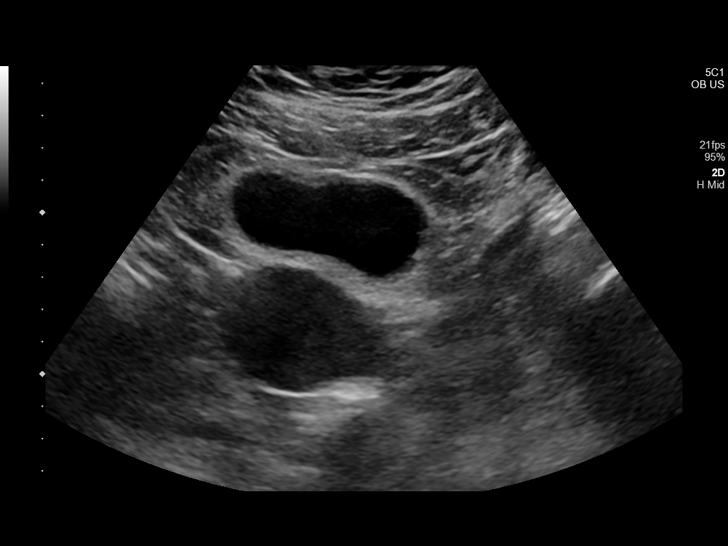
[im 19/74]
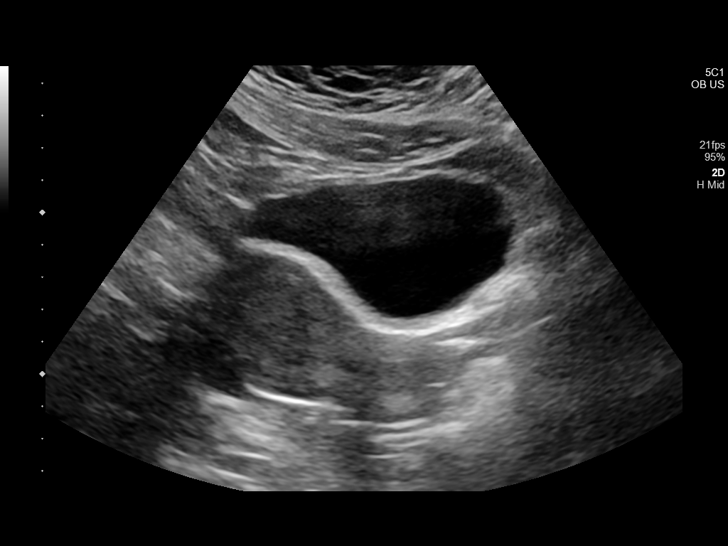
[im 25/74]
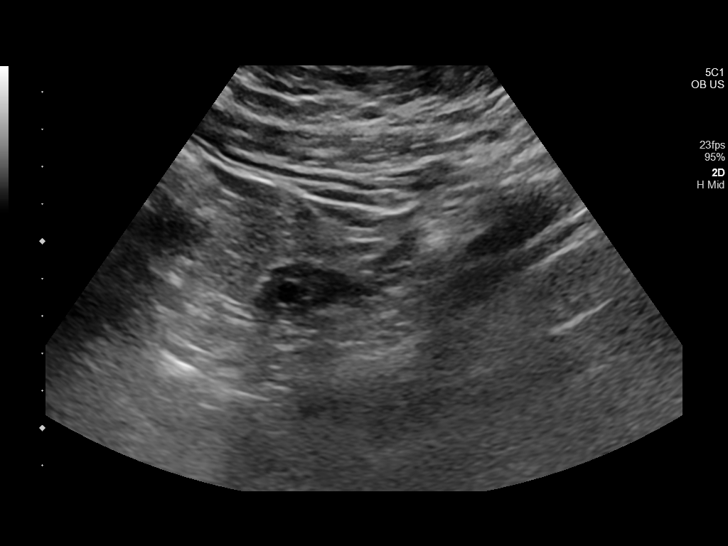
[im 30/74]
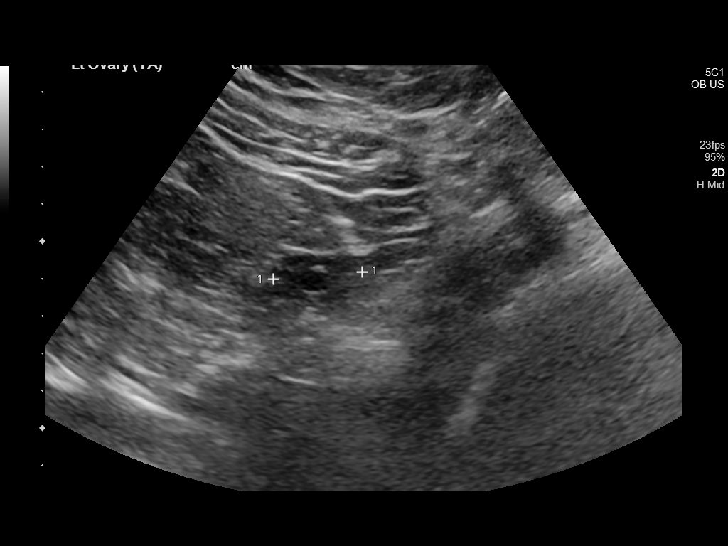
[im 38/74]
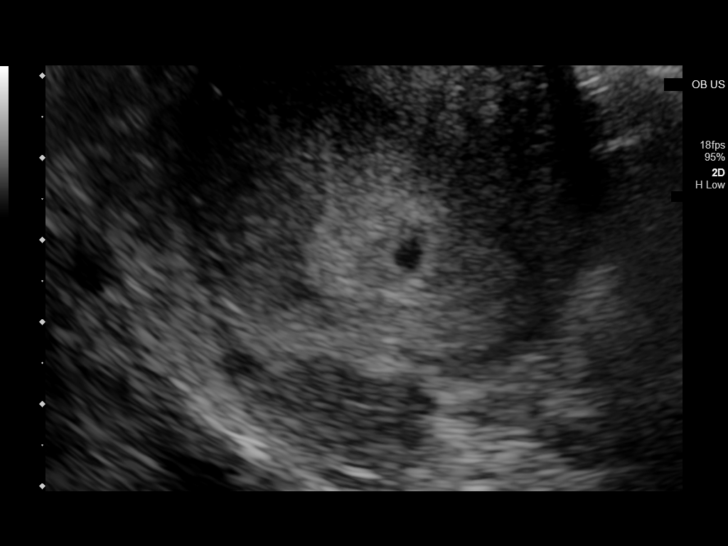
[im 44/74]
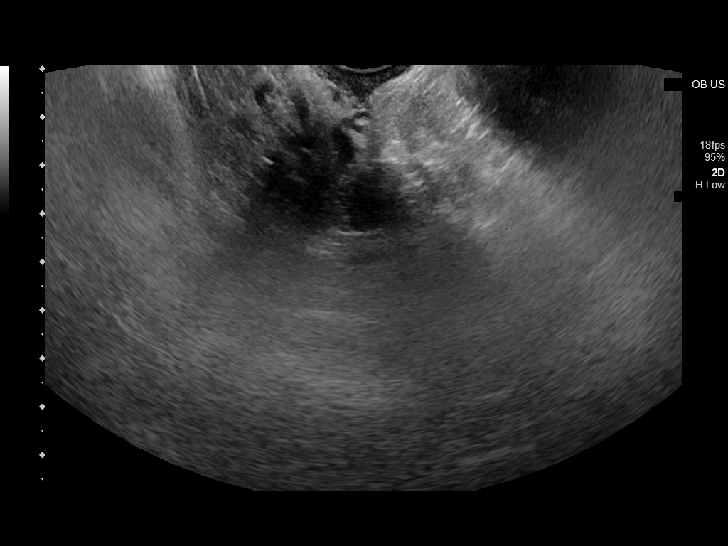
[im 49/74]
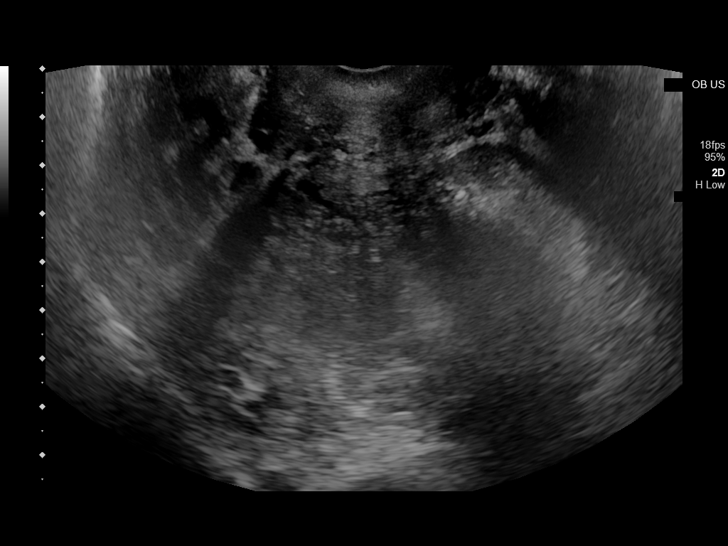
[im 55/74]
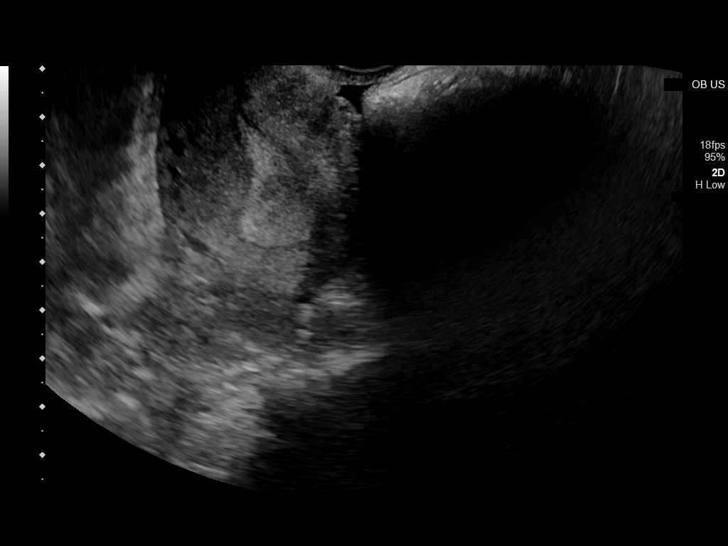
[im 60/74]
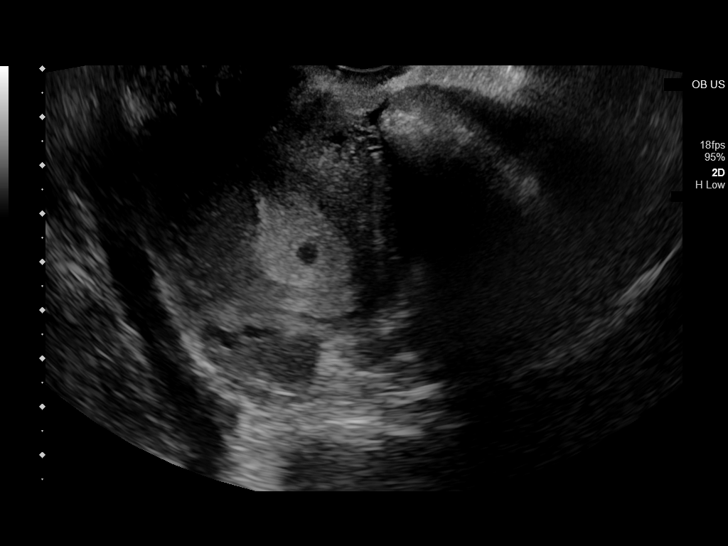
[im 65/74]
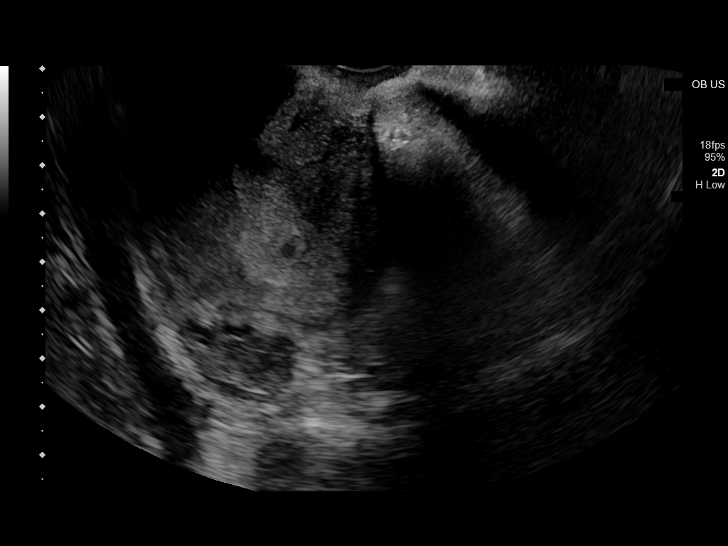
[im 71/74]
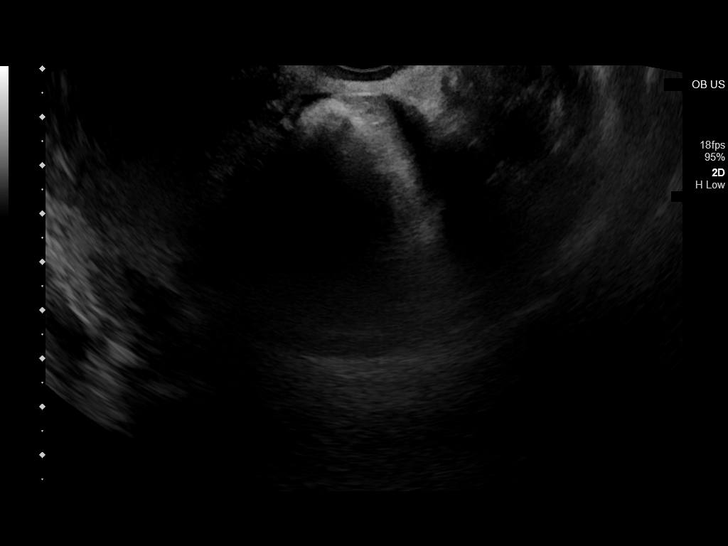

[13 of 28 positions shown; findings below may reference images not displayed]

FINDINGS: Intrauterine gestational sac: Single intrauterine sac-like
structure.

Yolk sac:  Not Visualized.

Embryo:  Not Visualized.

Cardiac Activity: Not Visualized.

MSD: 3.5 mm   5 w   1 d

Subchorionic hemorrhage:  None visualized.

Maternal uterus/adnexae: No uterine fibroids or other myometrial
abnormalities. Right ovary measures 3.2 x 1.8 x 2.5 cm. Left ovary
(seen only on transabdominal images) measures 3.1 x 1.5 x 2.4 cm. No
ovarian or adnexal masses. No abnormal free fluid in the pelvis.

Pulsed Doppler evaluation of both ovaries demonstrates normal
appearing low-resistance arterial and venous waveforms.
IMPRESSION: 1. Single intrauterine sac-like structure at 5 weeks 1 day by mean
sac diameter, without definitive features of pregnancy (yolk sac or
embryo) identified at this time. No abnormal ovarian or adnexal
masses. Sonographic differential diagnosis includes intrauterine
gestation, spontaneous abortion or occult ectopic gestation.
Recommend close clinical follow-up and serial serum beta HCG
monitoring, with repeat obstetric scan in 11-14 days or earlier as
warranted by beta HCG levels and clinical assessment.
2. No evidence of adnexal torsion.

## 2021-04-15 ENCOUNTER — Encounter: Payer: Medicaid Other | Admitting: Obstetrics and Gynecology

## 2021-04-28 ENCOUNTER — Other Ambulatory Visit: Payer: Self-pay

## 2021-04-28 ENCOUNTER — Ambulatory Visit (INDEPENDENT_AMBULATORY_CARE_PROVIDER_SITE_OTHER): Payer: Medicaid Other | Admitting: Internal Medicine

## 2021-04-28 ENCOUNTER — Encounter: Payer: Self-pay | Admitting: Internal Medicine

## 2021-04-28 VITALS — BP 140/82 | HR 97 | Ht 65.0 in | Wt 257.0 lb

## 2021-04-28 DIAGNOSIS — I493 Ventricular premature depolarization: Secondary | ICD-10-CM | POA: Diagnosis not present

## 2021-04-28 DIAGNOSIS — I10 Essential (primary) hypertension: Secondary | ICD-10-CM

## 2021-04-28 NOTE — Progress Notes (Signed)
Follow-up Outpatient Visit Date: 04/28/2021  Primary Care Provider: [facility] [address] [place] [address]  Chief Complaint: Follow-up hypertension  HPI:  Stacy Soto is a 21 y.o. female with history of hypertension, palpitations, and heart murmur, who presents for follow-up of hypertension.  She was last seen in our office a year ago, when she was 23-weeks pregnant.  Prior workup, including echo, renal artery Doppler, and serum aldosterone/plasma renin activity ratio, was unremarkable other than mild mitral regurgitation.  No medication changes were made.  Today, [name] reports that she feels fairly well though she has been under quite a bit of stress over the last week.  Her 36-month-old daughter had a concussion at daycare last week and has been unable to keep down formula since then.  [name] attributes her elevated blood pressure on initial check today to this.  She otherwise has been feeling fairly well, denying chest pain and shortness of breath.  She notes occasional skipped beats and heart racing, which she attributes to anxiety.  She sometimes feels a little lightheaded during these episodes.  She has been trying to stay well-hydrated.  She notes that her blood pressure has gradually trended down following delivery of her daughter.  --------------------------------------------------------------------------------------------------  Past Medical History:  Diagnosis Date   Anxiety    Cold    head cold for last 7 days (06/14/16)  improving   Depression    Gestational diabetes    taking glyburide   Heart murmur    mild mitral regurigation by echo   Hypertension    IBS (irritable bowel syndrome)    Orthodontics    wears braces   Pregnancy induced hypertension    PVCs (premature ventricular contractions)    Past Surgical History:  Procedure Laterality Date   ADENOIDECTOMY  2005   NASAL TURBINATE REDUCTION Left 06/28/2016   Procedure: TURBINATE  REDUCTION/SUBMUCOSAL RESECTION;  Surgeon: Geanie Logan, MD;  Location: [facility];  Service: ENT;  Laterality: Left;   SEPTOPLASTY N/A 06/28/2016   Procedure: SEPTOPLASTY;  Surgeon: Geanie Logan, MD;  Location: [facility];  Service: ENT;  Laterality: N/A;   TONSILLECTOMY  2005   TONSILLECTOMY      Current Meds  Medication Sig   acetaminophen (TYLENOL) 325 MG tablet Take 325 mg by mouth as needed.   ibuprofen (ADVIL) 200 MG tablet Take 200 mg by mouth as needed.   levonorgestrel (MIRENA) 20 MCG/DAY IUD 1 each by Intrauterine route once. Inserted 11/06/2020 [name]    Allergies: Patient has no known allergies.  Social History   Tobacco Use   Smoking status: Former    Types: E-cigarettes    Quit date: 11/01/2019    Years since quitting: 1.4   Smokeless tobacco: Never  Vaping Use   Vaping Use: Never used  Substance Use Topics   Alcohol use: Yes    Comment: occassional glass of wine   Drug use: Never    Family History  Problem Relation Age of Onset   Other Mother        Gastro problems   Polycystic ovary syndrome Mother    Diabetes Father    Breast cancer Paternal Grandmother    Diabetes Paternal Grandfather     Review of Systems: A 12-system review of systems was performed and was negative except as noted in the HPI.  --------------------------------------------------------------------------------------------------  Physical Exam: BP 140/82 (BP Location: Left Arm, Patient Position: Sitting, Cuff Size: Large)    Pulse 97  Ht 5\' 5"  (1.651 m)    Wt 257 lb (116.6 kg)    SpO2 98%    BMI 42.77 kg/m   General:  NAD. Neck: No JVD or HJR. Lungs: Clear to auscultation bilaterally without wheezes or crackles. Heart: Regular rate and rhythm with frequent extrasystoles.  1/6 systolic murmur present.  No rubs or gallops.. Abdomen: Soft, nontender, nondistended. Extremities: No lower extremity edema.  EKG: Normal sinus rhythm with frequent PVCs and borderline LVH.   Poor R wave progression noted, likely due to lead placement.  PVCs are new since 03/02/2020.  Otherwise, no significant interval change.  Lab Results  Component Value Date   WBC 9.7 07/28/2020   HGB 13.4 09/09/2020   HCT 40.3 09/09/2020   MCV 87.0 07/28/2020   PLT 170 07/28/2020    Lab Results  Component Value Date   NA 138 04/22/2020   K 4.3 04/22/2020   CL 107 (H) 04/22/2020   CO2 18 (L) 04/22/2020   BUN 5 (L) 04/22/2020   CREATININE 0.45 (L) 04/22/2020   GLUCOSE 108 (H) 04/22/2020   ALT 11 03/06/2020    Lab Results  Component Value Date   CHOL 164 01/22/2013   HDL 54 01/22/2013   LDLCALC 64 01/22/2013   TRIG 231 (H) 01/22/2013    --------------------------------------------------------------------------------------------------  ASSESSMENT AND PLAN: Hypertension: Initial blood pressure check today borderline elevated, normalized on recheck.  I encouraged [name] to continue with sodium restriction and to work on weight loss through diet and exercise.  No plans for pharmacotherapy today.  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  I will check a CBC, CMP, and TSH today.  If [name] continues to have palpitations ticagrelor is again noted to have PVCs on follow-up exam/EKG, will need to consider cardiac event monitoring.  Echocardiogram last year was reassuring without structural abnormalities other than mild mitral regurgitation.  Morbid obesity: BMI remains greater than 40.  Weight loss encouraged with diet and exercise.  [name], MD 04/28/2021 11:28 AM

## 2021-04-28 NOTE — Patient Instructions (Signed)
Medication Instructions:   Your physician recommends that you continue on your current medications as directed. Please refer to the Current Medication list given to you today.  *If you need a refill on your cardiac medications before your next appointment, please call your pharmacy*   Lab Work:  Today: CBC, BMET, Magnesium, TSH  If you have labs (blood work) drawn today and your tests are completely normal, you will receive your results only by: MyChart Message (if you have MyChart) OR A paper copy in the mail If you have any lab test that is abnormal or we need to change your treatment, we will call you to review the results.   Testing/Procedures:  None ordered  Follow-Up: At [facility], you and your health needs are our priority.  As part of our continuing mission to provide you with exceptional heart care, we have created designated Provider Care Teams.  These Care Teams include your primary Cardiologist (physician) and Advanced Practice Providers (APPs -  Physician Assistants and Nurse Practitioners) who all work together to provide you with the care you need, when you need it.  We recommend signing up for the patient portal called "MyChart".  Sign up information is provided on this After Visit Summary.  MyChart is used to connect with patients for Virtual Visits (Telemedicine).  Patients are able to view lab/test results, encounter notes, upcoming appointments, etc.  Non-urgent messages can be sent to your provider as well.   To learn more about what you can do with MyChart, go to [email or web address].    Your next appointment:   2 month(s)  The format for your next appointment:   In Person  Provider:   You may see Yvonne Kendall, MD or one of the following Advanced Practice Providers on your designated Care Team:   Nicolasa Ducking, NP Eula Listen, PA-C Cadence Fransico Michael, [place]

## 2021-04-29 LAB — BASIC METABOLIC PANEL
BUN/Creatinine Ratio: 14 (ref 9–23)
BUN: 8 mg/dL (ref 6–20)
CO2: 18 mmol/L — ABNORMAL LOW (ref 20–29)
Calcium: 9.7 mg/dL (ref 8.7–10.2)
Chloride: 101 mmol/L (ref 96–106)
Creatinine, Ser: 0.59 mg/dL (ref 0.57–1.00)
Glucose: 108 mg/dL — ABNORMAL HIGH (ref 70–99)
Potassium: 4.6 mmol/L (ref 3.5–5.2)
Sodium: 138 mmol/L (ref 134–144)
eGFR: 131 mL/min/{1.73_m2} (ref >59)

## 2021-04-29 LAB — CBC
Hematocrit: 42.4 % (ref 34.0–46.6)
Hemoglobin: 14.4 g/dL (ref 11.1–15.9)
MCH: 27.9 pg (ref 26.6–33.0)
MCHC: 34 g/dL (ref 31.5–35.7)
MCV: 82 fL (ref 79–97)
Platelets: 270 10*3/uL (ref 150–450)
RBC: 5.17 x10E6/uL (ref 3.77–5.28)
RDW: 13 % (ref 11.7–15.4)
WBC: 8.8 10*3/uL (ref 3.4–10.8)

## 2021-04-29 LAB — TSH: TSH: 1.9 u[IU]/mL (ref 0.450–4.500)

## 2021-04-29 LAB — MAGNESIUM: Magnesium: 2 mg/dL (ref 1.6–2.3)

## 2021-05-06 ENCOUNTER — Encounter: Payer: Medicaid Other | Admitting: Obstetrics and Gynecology

## 2021-06-10 ENCOUNTER — Encounter: Payer: Medicaid Other | Admitting: Obstetrics and Gynecology

## 2021-07-07 ENCOUNTER — Ambulatory Visit (INDEPENDENT_AMBULATORY_CARE_PROVIDER_SITE_OTHER): Payer: Medicaid Other

## 2021-07-07 ENCOUNTER — Ambulatory Visit (INDEPENDENT_AMBULATORY_CARE_PROVIDER_SITE_OTHER): Payer: Medicaid Other | Admitting: Internal Medicine

## 2021-07-07 ENCOUNTER — Encounter: Payer: Self-pay | Admitting: Internal Medicine

## 2021-07-07 VITALS — BP 140/70 | HR 105 | Ht 65.0 in | Wt 263.0 lb

## 2021-07-07 DIAGNOSIS — R0789 Other chest pain: Secondary | ICD-10-CM

## 2021-07-07 DIAGNOSIS — I493 Ventricular premature depolarization: Secondary | ICD-10-CM

## 2021-07-07 DIAGNOSIS — I10 Essential (primary) hypertension: Secondary | ICD-10-CM | POA: Diagnosis not present

## 2021-07-07 DIAGNOSIS — R42 Dizziness and giddiness: Secondary | ICD-10-CM

## 2021-07-07 NOTE — Patient Instructions (Addendum)
Medication Instructions:  ? ?Your physician recommends that you continue on your current medications as directed. Please refer to the Current Medication list given to you today. ? ?*If you need a refill on your cardiac medications before your next appointment, please call your pharmacy* ? ? ?Lab Work: ? ?None ordered ? ?Testing/Procedures: ? ?Your physician has recommended that you wear a Zio monitor for TWO WEEKS.  ?This will be mailed to you.   ? ?This monitor is a medical device that records the heart?s electrical activity. Doctors most often use these monitors to diagnose arrhythmias. Arrhythmias are problems with the speed or rhythm of the heartbeat. The monitor is a small device applied to your chest. You can wear one while you do your normal daily activities. While wearing this monitor if you have any symptoms to push the button and record what you felt. Once you have worn this monitor for the period of time provider prescribed (Usually 14 days), you will return the monitor device in the postage paid box. Once it is returned they will download the data collected and provide [place] with a report which the provider will then review and we will call you with those results. Important tips: ? ?Avoid showering during the first 24 hours of wearing the monitor. ?Avoid excessive sweating to help maximize wear time. ?Do not submerge the device, no hot tubs, and no swimming pools. ?Keep any lotions or oils away from the patch. ?After 24 hours you may shower with the patch on. Take brief showers with your back facing the shower head.  ?Do not remove patch once it has been placed because that will interrupt data and decrease adhesive wear time. ?Push the button when you have any symptoms and write down what you were feeling. ?Once you have completed wearing your monitor, remove and place into box which has postage paid and place in your outgoing mailbox.  ?If for some reason you have misplaced your box then call our office  and we can provide another box and/or mail it off for you. ? ? ? ? ? ?Follow-Up: ?At [facility], you and your health needs are our priority.  As part of our continuing mission to provide you with exceptional heart care, we have created designated Provider Care Teams.  These Care Teams include your primary Cardiologist (physician) and Advanced Practice Providers (APPs -  Physician Assistants and Nurse Practitioners) who all work together to provide you with the care you need, when you need it. ? ?We recommend signing up for the patient portal called "MyChart".  Sign up information is provided on this After Visit Summary.  MyChart is used to connect with patients for Virtual Visits (Telemedicine).  Patients are able to view lab/test results, encounter notes, upcoming appointments, etc.  Non-urgent messages can be sent to your provider as well.   ?To learn more about what you can do with MyChart, go to [email or web address].   ? ?Your next appointment:   ?6 week(s) ? ?The format for your next appointment:   ?In Person ? ?Provider:   ?You may see Yvonne Kendall, MD or one of the following Advanced Practice Providers on your designated Care Team:   ?Nicolasa Ducking, NP ?Eula Listen, PA-C ?Cadence Fransico Michael, PA-C ? ? ?Important Information About Sugar ? ? ? ? ? ? ?

## 2021-07-07 NOTE — Progress Notes (Signed)
? ?Follow-up Outpatient Visit ?Date: 07/07/2021 ? ?Primary Care Provider: ?[place] ?[address] [address] ?[place] [address] ? ?Chief Complaint: Follow-up hypertension and PVCs ? ?HPI:  Stacy Soto is a 21 y.o. female with history of hypertension, palpitation, and heart murmur, who presents for follow-up of hypertension and PVCs.  I last saw her in early February, at which time she was feeling fairly well though she had been under more stress due to her daughter having suffered a concussion at daycare.  Today, Stacy Soto reports that she is feeling fairly well.  She notes occasional palpitations and lightheadedness.  She also reports random, stabbing chest pains lasting a few seconds at a time.  These can happen with activity or when she is at rest.  Sometimes, they worsen with positional changes.  She still notes chronic dependent edema in both legs.  Home blood pressures have been hovering around 140/60.  She is trying to minimize her sodium intake. ? ?-------------------------------------------------------------------------------------------------- ? ?Past Medical History:  ?Diagnosis Date  ? Anxiety   ? Cold   ? head cold for last 7 days (06/14/16)  improving  ? Depression   ? Gestational diabetes   ? taking glyburide  ? Heart murmur   ? mild mitral regurigation by echo  ? Hypertension   ? IBS (irritable bowel syndrome)   ? Orthodontics   ? wears braces  ? Pregnancy induced hypertension   ? PVCs (premature ventricular contractions)   ? ?Past Surgical History:  ?Procedure Laterality Date  ? ADENOIDECTOMY  2005  ? NASAL TURBINATE REDUCTION Left 06/28/2016  ? Procedure: TURBINATE REDUCTION/SUBMUCOSAL RESECTION;  Surgeon: Geanie Logan, MD;  Location: [facility];  Service: ENT;  Laterality: Left;  ? SEPTOPLASTY N/A 06/28/2016  ? Procedure: SEPTOPLASTY;  Surgeon: Geanie Logan, MD;  Location: [facility];  Service: ENT;  Laterality: N/A;  ? TONSILLECTOMY  2005  ? TONSILLECTOMY    ? ? ?Current  Meds  ?Medication Sig  ? acetaminophen (TYLENOL) 325 MG tablet Take 325 mg by mouth as needed.  ? ibuprofen (ADVIL) 200 MG tablet Take 200 mg by mouth as needed.  ? levonorgestrel (MIRENA) 20 MCG/DAY IUD 1 each by Intrauterine route once. Inserted 11/06/2020 [name]  ? ? ?Allergies: Patient has no known allergies. ? ?Social History  ? ?Tobacco Use  ? Smoking status: Former  ?  Types: E-cigarettes  ?  Quit date: 11/01/2019  ?  Years since quitting: 1.6  ? Smokeless tobacco: Never  ?Vaping Use  ? Vaping Use: Never used  ?Substance Use Topics  ? Alcohol use: Yes  ?  Comment: occassional glass of wine  ? Drug use: Never  ? ? ?Family History  ?Problem Relation Age of Onset  ? Other Mother   ?     Gastro problems  ? Polycystic ovary syndrome Mother   ? Diabetes Father   ? Breast cancer Paternal Grandmother   ? Diabetes Paternal Grandfather   ? ? ?Review of Systems: ?A 12-system review of systems was performed and was negative except as noted in the HPI. ? ?-------------------------------------------------------------------------------------------------- ? ?Physical Exam: ?BP 140/70 (BP Location: Left Arm, Patient Position: Sitting, Cuff Size: Large)   Pulse (!) 105   Ht 5\' 5"  (1.651 m)   Wt 263 lb (119.3 kg)   SpO2 98%   BMI 43.77 kg/m?  ? ?General:  NAD. ?Neck: No JVD or HJR. ?Lungs: Clear to auscultation bilaterally without wheezes or crackles. ?Heart: Irregularly irregular rhythm with frequent extrasystoles  in a pattern of trigeminy.  1/6 systolic murmur noted. ?Abdomen: Soft, nontender, nondistended. ?Extremities: Trace pretibial edema present. ? ?EKG: Normal sinus rhythm with frequent PVCs in a pattern of trigeminy, borderline LVH, and nonspecific T wave changes.  Compared with prior tracing from 04/28/2021, PVCs have increased in frequency.  Nonspecific T wave changes are also present today. ? ?Lab Results  ?Component Value Date  ? WBC 8.8 04/28/2021  ? HGB 14.4 04/28/2021  ? HCT 42.4 04/28/2021  ? MCV 82 04/28/2021  ?  PLT 270 04/28/2021  ? ? ?Lab Results  ?Component Value Date  ? NA 138 04/28/2021  ? K 4.6 04/28/2021  ? CL 101 04/28/2021  ? CO2 18 (L) 04/28/2021  ? BUN 8 04/28/2021  ? CREATININE 0.59 04/28/2021  ? GLUCOSE 108 (H) 04/28/2021  ? ALT 11 03/06/2020  ? ? ?Lab Results  ?Component Value Date  ? CHOL 164 01/22/2013  ? HDL 54 01/22/2013  ? LDLCALC 64 01/22/2013  ? TRIG 231 (H) 01/22/2013  ? ? ?-------------------------------------------------------------------------------------------------- ? ?ASSESSMENT AND PLAN: ?PVCs: ?Ventricular trigeminy noted today.  Given palpitations and occasional dizziness, I think it would be prudent to better characterize her PVC burden.  We have agreed to place an event monitor.  Stacy Soto wishes to defer pharmacotherapy, though if frequent PVCs are concerned firmed, I would favor trial of a low-dose beta-blocker to help with her PVC burden as well as blood pressure. ? ?Hypertension: ?Blood pressure is again borderline elevated.  Stacy Soto is trying to limit her sodium intake.  We will defer adding pharmacotherapy at this time at the patient's request.  It also may be worthwhile to consider a sleep study in the future given her hypertension and morbid obesity.  Previous work-up for secondary hypertension has been unrevealing. ? ?Chest pain: ?Atypical.  Prior echo reassuring, though nonspecific T wave changes and more frequent PVCs noted on today's EKG.  Defer ischemia evaluation for now. ? ?Morbid obesity: ?BMI greater than 40.  Weight loss encouraged through diet and exercise. ? ?Follow-up: Return to clinic in 6 weeks. ? ?Yvonne Kendall, MD ?07/07/2021 ?4:13 PM ? ?

## 2021-07-08 ENCOUNTER — Encounter: Payer: Self-pay | Admitting: Internal Medicine

## 2021-07-08 ENCOUNTER — Telehealth: Payer: Self-pay | Admitting: *Deleted

## 2021-07-08 DIAGNOSIS — R0789 Other chest pain: Secondary | ICD-10-CM | POA: Insufficient documentation

## 2021-07-08 NOTE — Telephone Encounter (Signed)
Zio XT event monitor ordered for pt yesterday at office visit.  ?After order placed, pt informed that the address in her chart is her new address where she will be moving soon, however, is unable to receive mail at this time.  ?Pt provided another address that the monitor will need to be mailed.  ? ?New address pt provided: [address] [place], [place] [address] ? ?Contacted Linton Ham, rep for [employer]. Notified of need for address change.  ?Neeral confirmed that pt will receive monitor sent overnight to address provided.  ?Neeral states if pt does receive original monitor, whenever she is able to receive mail at prior address given, pt may mail back to [place].  ? ?Called pt and notified. Pt voiced understanding.  ? ?

## 2021-07-09 DIAGNOSIS — R42 Dizziness and giddiness: Secondary | ICD-10-CM

## 2021-07-09 DIAGNOSIS — I493 Ventricular premature depolarization: Secondary | ICD-10-CM

## 2021-07-14 NOTE — Progress Notes (Signed)
? ? ?GYNECOLOGY ANNUAL PHYSICAL EXAM PROGRESS NOTE ? ?Subjective:  ? ? Stacy Soto is a 21 y.o. G31P1001 female who presents for an annual exam.  The patient is sexually active. The patient participates in regular exercise: yes. Has the patient ever been transfused or tattooed?: yes. The patient reports that there is not domestic violence in her life.  ? ?The patient has the following complaints today:  ?1) Currently wearing  a Holter monitor. Notes that her heart sometimes skips a beat.  ?2) reports that she has been experiencing occasional nosebleed and migraine over the past 2 days.  Has a history of these in the past.  Migraines not relieved by Tylenol.  The last time that she had this she discovered that was pregnant.  Concerns as she does have an IUD in place. ?3) Also noticed some abdominal pain over the past 2 days, a sharp and stabbing in nature.  He has tried heating pads and Tylenol without relief.  Of note, patient does report that she has been moving (purchased a new home) over the past several days, however denies any heavy lifting. ? ?Menstrual History: ?Menarche age: 41 ?No LMP recorded. (Menstrual status: IUD). ?  ? ? ?Gynecologic History:  ?Contraception:  Mirena IUD (inserted 11/06/2020) ?History of STI's: Denies ?Last Pap: never done ?Last mammogram: Not age appropriate ? ? ? ?OB History  ?Gravida Para Term Preterm AB Living  ?1 1 1  0 0 1  ?SAB IAB Ectopic Multiple Live Births  ?0 0 0 0 1  ?  ?# Outcome Date GA Lbr Len/2nd Weight Sex Delivery Anes PTL Lv  ?1 Term 07/27/20 [redacted]w[redacted]d 12:40 / 01:20 6 lb 1.7 oz (2.77 kg) F Vag-Spont EPI  LIV  ?   Name: Stacy Soto, Stacy Soto  ?   Apgar1: 8  Apgar5: 9  ? ? ?Past Medical History:  ?Diagnosis Date  ? Anxiety   ? Cold   ? head cold for last 7 days (06/14/16)  improving  ? Depression   ? Gestational diabetes   ? taking glyburide  ? Heart murmur   ? mild mitral regurigation by echo  ? Hypertension   ? IBS (irritable bowel syndrome)   ? Orthodontics   ? wears  braces  ? Pregnancy induced hypertension   ? PVCs (premature ventricular contractions)   ? ? ?Past Surgical History:  ?Procedure Laterality Date  ? ADENOIDECTOMY  2005  ? NASAL TURBINATE REDUCTION Left 06/28/2016  ? Procedure: TURBINATE REDUCTION/SUBMUCOSAL RESECTION;  Surgeon: 08/28/2016, MD;  Location: [facility];  Service: ENT;  Laterality: Left;  ? SEPTOPLASTY N/A 06/28/2016  ? Procedure: SEPTOPLASTY;  Surgeon: 08/28/2016, MD;  Location: [facility];  Service: ENT;  Laterality: N/A;  ? TONSILLECTOMY  2005  ? TONSILLECTOMY    ? ? ?Family History  ?Problem Relation Age of Onset  ? Other Mother   ?     Gastro problems  ? Polycystic ovary syndrome Mother   ? Diabetes Father   ? Breast cancer Paternal Grandmother   ? Diabetes Paternal Grandfather   ? ? ?Social History  ? ?Socioeconomic History  ? Marital status: Significant Other  ?  Spouse name: 2006  ? Number of children: Not on file  ? Years of education: Not on file  ? Highest education level: Not on file  ?Occupational History  ? Occupation: Telecommunicater  ?Tobacco Use  ? Smoking status: Former  ?  Types: E-cigarettes  ?  Quit date: 11/01/2019  ?  Years since quitting: 1.7  ? Smokeless tobacco: Never  ?Vaping Use  ? Vaping Use: Never used  ?Substance and Sexual Activity  ? Alcohol use: Yes  ?  Comment: occassional glass of wine  ? Drug use: Never  ? Sexual activity: Yes  ?  Birth control/protection: None, I.U.D.  ?Other Topics Concern  ? Not on file  ?Social History Narrative  ? Not on file  ? ?Social Determinants of Health  ? ?Financial Resource Strain: Not on file  ?Food Insecurity: Not on file  ?Transportation Needs: Not on file  ?Physical Activity: Not on file  ?Stress: Not on file  ?Social Connections: Not on file  ?Intimate Partner Violence: Not on file  ? ? ?Current Outpatient Medications on File Prior to Visit  ?Medication Sig Dispense Refill  ? acetaminophen (TYLENOL) 325 MG tablet Take 325 mg by mouth as needed.    ? ibuprofen (ADVIL)  200 MG tablet Take 200 mg by mouth as needed.    ? levonorgestrel (MIRENA) 20 MCG/DAY IUD 1 each by Intrauterine route once. Inserted 11/06/2020 [name]    ? ?No current facility-administered medications on file prior to visit.  ? ? ?No Known Allergies ? ? ?Review of Systems ?Review of Systems - Negative except what is noted in HPI ? ? ? ?Objective:  ?Body mass index is 42.3 kg/m?Marland Kitchen. Blood pressure (!) 152/73, pulse (!) 56, resp. rate 16, height 5' 5.5" (1.664 m), weight 258 lb 1.6 oz (117.1 kg), not currently breastfeeding.  Repeat blood pressure 134/82 ? ?General Appearance:    Alert, cooperative, no distress, appears stated age  ?Head:    Normocephalic, without obvious abnormality, atraumatic  ?Eyes:    PERRL, conjunctiva/corneas clear, EOM's intact, both eyes  ?Ears:    Normal external ear canals, both ears  ?Nose:   Nares normal, septum midline, mucosa normal, no drainage or sinus tenderness  ?Throat:   Lips, mucosa, and tongue normal; teeth and gums normal  ?Neck:   Supple, symmetrical, trachea midline, no adenopathy; thyroid: no enlargement/tenderness/nodules; no carotid bruit or JVD  ?Back:     Symmetric, no curvature, ROM normal, no CVA tenderness  ?Lungs:     Clear to auscultation bilaterally, respirations unlabored  ?Chest Wall:    No tenderness or deformity  ? Heart:    Regular rate and rhythm, S1 and S2 normal, no murmur, rub or gallop  ?Breast Exam:    No tenderness, masses, or nipple abnormality  ?Abdomen:     Soft, non-tender, bowel sounds active all four quadrants, no masses, no organomegaly.    ?Genitalia:    Pelvic:external genitalia normal, vagina without lesions, discharge, or tenderness, rectovaginal septum  normal. Cervix normal in appearance, no cervical motion tenderness, IUD threads visible approximately 2 cm in length.  No adnexal masses or tenderness.  Uterus normal size, shape, mobile, regular contours, nontender.  ?Rectal:    Normal external sphincter.  No hemorrhoids appreciated. Internal  exam not done.   ?Extremities:   Extremities normal, atraumatic, no cyanosis or edema  ?Pulses:   2+ and symmetric all extremities  ?Skin:   Skin color, texture, turgor normal, no rashes or lesions  ?Lymph nodes:   Cervical, supraclavicular, and axillary nodes normal  ?Neurologic:   CNII-XII intact, normal strength, sensation and reflexes throughout  ? ?. ? ?Labs:  ?Lab Results  ?Component Value Date  ? WBC 8.8 04/28/2021  ? HGB 14.4 04/28/2021  ? HCT 42.4 04/28/2021  ? MCV 82 04/28/2021  ? PLT 270  04/28/2021  ? ? ?Lab Results  ?Component Value Date  ? CREATININE 0.59 04/28/2021  ? BUN 8 04/28/2021  ? NA 138 04/28/2021  ? K 4.6 04/28/2021  ? CL 101 04/28/2021  ? CO2 18 (L) 04/28/2021  ? ? ?Lab Results  ?Component Value Date  ? ALT 11 03/06/2020  ? AST 10 03/06/2020  ? ALKPHOS 56 03/06/2020  ? BILITOT <0.2 03/06/2020  ? ? ?Lab Results  ?Component Value Date  ? TSH 1.900 04/28/2021  ? ? ? ? ?Assessment:  ? ?1. Encounter for well woman exam with routine gynecological exam   ?2. Cervical cancer screening   ?3. Essential hypertension   ?4. Obesity, morbid, BMI 40.0-49.9 (HCC)   ?5. Migraine without aura and without status migrainosus, not intractable   ?6. Nosebleed   ? ?  ?Plan:  ?Blood tests: None ordered.  Recently had labs by Cardiology. ?Breast self exam technique reviewed and patient encouraged to perform self-exam monthly. ?Contraception: Mirena IUD.  Due for removal in 2030. ?Discussed healthy lifestyle modifications. ?Mammogram  Not age appropriate ?Pap smear ordered. ?Essential hypertension, currently on no meds.  Initial BP elevated, with repeat normal.  Advised to continue monitoring, may also be addressed by cardiology. ?Migraine without aura, given samples of Nurtec.  If beneficial, can send in prescription. ?Nosebleed, notes that it does not happen often.  More concerned with possibility of pregnancy.  UPT performed today, negative. ?Follow up in 1 year for annual exam ? ? ?Hildred Laser, MD ?[facility]  ?

## 2021-07-14 NOTE — Patient Instructions (Signed)
Breast Self-Awareness ?Breast self-awareness is knowing how your breasts look and feel. You need to: ?Check your breasts on a regular basis. ?Tell your doctor about any changes. ?Become familiar with the look and feel of your breasts. This can help you catch a breast problem while it is still small and can be treated. You should do breast self-exams even if you have breast implants. ?What you need: ?A mirror. ?A well-lit room. ?A pillow or other soft object. ?How to do a breast self-exam ?Follow these steps to do a breast self-exam: ?Look for changes ? ?Take off all the clothes above your waist. ?Stand in front of a mirror in a room with good lighting. ?Put your hands down at your sides. ?Compare your breasts in the mirror. Look for any difference between them, such as: ?A difference in shape. ?A difference in size. ?Wrinkles, dips, and bumps in one breast and not the other. ?Look at each breast for changes in the skin, such as: ?Redness. ?Scaly areas. ?Skin that has gotten thicker. ?Dimpling. ?Open sores (ulcers). ?Look for changes in your nipples, such as: ?Fluid coming out of a nipple. ?Fluid around a nipple. ?Bleeding. ?Dimpling. ?Redness. ?A nipple that looks pushed in (retracted), or that has changed position. ?Feel for changes ?Lie on your back. ?Feel each breast. To do this: ?Pick a breast to feel. ?Place a pillow under the shoulder closest to that breast. Put the arm closest to that breast behind your head. ?Feel the nipple area of that breast using the hand of your other arm. Feel the area with the pads of your three middle fingers by making small circles with your fingers. Use light, medium, and firm pressure. ?Continue the overlapping circles, moving downward over the breast. Keep making circles with your fingers. Stop when you feel your ribs. ?Start making circles with your fingers again, this time going upward until you reach your collarbone. ?Then, make circles outward across your breast and into your  armpit area. ?Squeeze your nipple. Check for discharge and lumps. ?Repeat these steps to check your other breast. ?Sit or stand in the tub or shower. ?With soapy water on your skin, feel each breast the same way you did when you were lying down. ?Write down what you find ?Writing down what you find can help you remember what to tell your doctor. Write down: ?What is normal for each breast. ?Any changes you find in each breast. These include: ?The kind of changes you find. ?A tender or painful breast. ?Any lump you find. Write down its size and where it is. ?When you last had your monthly period (menstrual cycle). ?General tips ?If you are breastfeeding, the best time to check your breasts is after you feed your baby or after you use a breast pump. ?If you get monthly bleeding, the best time to check your breasts is 5-7 days after your monthly cycle ends. ?With time, you will become comfortable with the self-exam. You will also start to know if there are changes in your breasts. ?Contact a doctor if: ?You see a change in the shape or size of your breasts or nipples. ?You see a change in the skin of your breast or nipples, such as red or scaly skin. ?You have fluid coming from your nipples that is not normal. ?You find a new lump or thick area. ?You have breast pain. ?You have any concerns about your breast health. ?Summary ?Breast self-awareness includes looking for changes in your breasts and feeling for changes   within your breasts. ?You should do breast self-awareness in front of a mirror in a well-lit room. ?If you get monthly periods (menstrual cycles), the best time to check your breasts is 5-7 days after your period ends. ?Tell your doctor about any changes you see in your breasts. Changes include changes in size, changes on the skin, painful or tender breasts, or fluid from your nipples that is not normal. ?This information is not intended to replace advice given to you by your health care provider. Make sure  you discuss any questions you have with your health care provider. ?Document Revised: 01/14/2021 Document Reviewed: 01/14/2021 ?Elsevier Patient Education ? [place]. ?Preventive Care 70-66 Years Old, Female ?Preventive care refers to lifestyle choices and visits with your health care provider that can promote health and wellness. At this stage in your life, you may start seeing a primary care physician instead of a pediatrician for your preventive care. Preventive care visits are also called wellness exams. ?What can I expect for my preventive care visit? ?Counseling ?During your preventive care visit, your health care provider may ask about your: ?Medical history, including: ?Past medical problems. ?Family medical history. ?Pregnancy history. ?Current health, including: ?Menstrual cycle. ?Method of birth control. ?Emotional well-being. ?Home life and relationship well-being. ?Sexual activity and sexual health. ?Lifestyle, including: ?Alcohol, nicotine or tobacco, and drug use. ?Access to firearms. ?Diet, exercise, and sleep habits. ?Sunscreen use. ?Motor vehicle safety. ?Physical exam ?Your health care provider may check your: ?Height and weight. These may be used to calculate your BMI (body mass index). BMI is a measurement that tells if you are at a healthy weight. ?Waist circumference. This measures the distance around your waistline. This measurement also tells if you are at a healthy weight and may help predict your risk of certain diseases, such as type 2 diabetes and high blood pressure. ?Heart rate and blood pressure. ?Body temperature. ?Skin for abnormal spots. ?Breasts. ?What immunizations do I need? ? ?Vaccines are usually given at various ages, according to a schedule. Your health care provider will recommend vaccines for you based on your age, medical history, and lifestyle or other factors, such as travel or where you work. ?What tests do I need? ?Screening ?Your health care provider may  recommend screening tests for certain conditions. This may include: ?Vision and hearing tests. ?Lipid and cholesterol levels. ?Pelvic exam and Pap test. ?Hepatitis B test. ?Hepatitis C test. ?HIV (human immunodeficiency virus) test. ?STI (sexually transmitted infection) testing, if you are at risk. ?Tuberculosis skin test if you have symptoms. ?BRCA-related cancer screening. This may be done if you have a family history of breast, ovarian, tubal, or peritoneal cancers. ?Talk with your health care provider about your test results, treatment options, and if necessary, the need for more tests. ?Follow these instructions at home: ?Eating and drinking ? ?Eat a healthy diet that includes fresh fruits and vegetables, whole grains, lean protein, and low-fat dairy products. ?Drink enough fluid to keep your urine pale yellow. ?Do not drink alcohol if: ?Your health care provider tells you not to drink. ?You are pregnant, may be pregnant, or are planning to become pregnant. ?You are under the legal drinking age. In the [place] the legal drinking age is 59. ?If you drink alcohol: ?Limit how much you have to 0-1 drink a day. ?Know how much alcohol is in your drink. In the [place] one drink equals one 12 oz bottle of beer (355 mL), one 5 oz glass of wine (148 mL),  or one 1? oz glass of hard liquor (44 mL). ?Lifestyle ?Brush your teeth every morning and night with fluoride toothpaste. Floss one time each day. ?Exercise for at least 30 minutes 5 or more days of the week. ?Do not use any products that contain nicotine or tobacco. These products include cigarettes, chewing tobacco, and vaping devices, such as e-cigarettes. If you need help quitting, ask your health care provider. ?Do not use drugs. ?If you are sexually active, practice safe sex. Use a condom or other form of protection to prevent STIs. ?If you do not wish to become pregnant, use a form of birth control. If you plan to become pregnant, see your health care provider for a  prepregnancy visit. ?Find healthy ways to manage stress, such as: ?Meditation, yoga, or listening to music. ?Journaling. ?Talking to a trusted person. ?Spending time with friends and family. ?Safety ?[place]

## 2021-07-16 ENCOUNTER — Other Ambulatory Visit (HOSPITAL_COMMUNITY)
Admission: RE | Admit: 2021-07-16 | Discharge: 2021-07-16 | Disposition: A | Discharge status: Home / Self Care | Payer: Medicaid Other | Source: Ambulatory Visit | Attending: Obstetrics and Gynecology | Admitting: Obstetrics and Gynecology

## 2021-07-16 ENCOUNTER — Encounter: Payer: Self-pay | Admitting: Obstetrics and Gynecology

## 2021-07-16 ENCOUNTER — Ambulatory Visit (INDEPENDENT_AMBULATORY_CARE_PROVIDER_SITE_OTHER): Payer: Medicaid Other | Admitting: Obstetrics and Gynecology

## 2021-07-16 VITALS — BP 152/73 | HR 56 | Resp 16 | Ht 65.5 in | Wt 258.1 lb

## 2021-07-16 DIAGNOSIS — I10 Essential (primary) hypertension: Secondary | ICD-10-CM | POA: Diagnosis not present

## 2021-07-16 DIAGNOSIS — Z01419 Encounter for gynecological examination (general) (routine) without abnormal findings: Secondary | ICD-10-CM | POA: Insufficient documentation

## 2021-07-16 DIAGNOSIS — R040 Epistaxis: Secondary | ICD-10-CM

## 2021-07-16 DIAGNOSIS — G43009 Migraine without aura, not intractable, without status migrainosus: Secondary | ICD-10-CM

## 2021-07-16 DIAGNOSIS — Z0000 Encounter for general adult medical examination without abnormal findings: Secondary | ICD-10-CM

## 2021-07-16 DIAGNOSIS — Z124 Encounter for screening for malignant neoplasm of cervix: Secondary | ICD-10-CM | POA: Insufficient documentation

## 2021-07-16 IMAGING — US US MFM OB FOLLOW-UP
1 series · 12 of 28 positions shown · non-contrast
Comparison: none

[Series 1: ob us · 38 acquisitions, 12 frames shown]
[im 2/38]
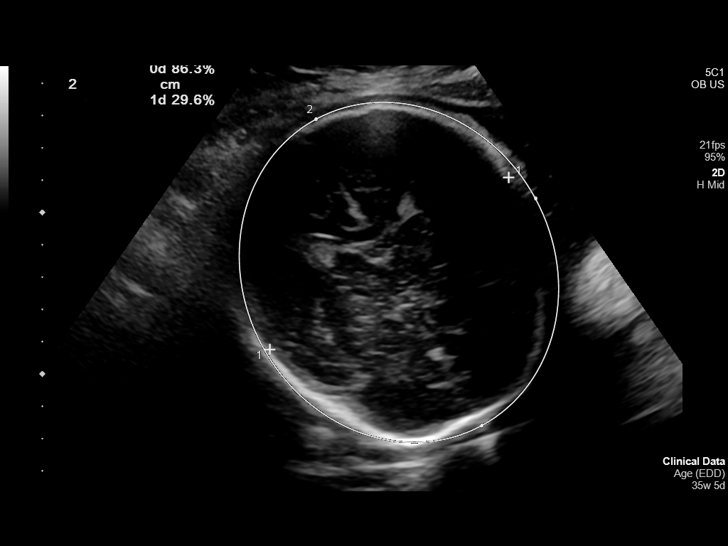
[im 5/38]
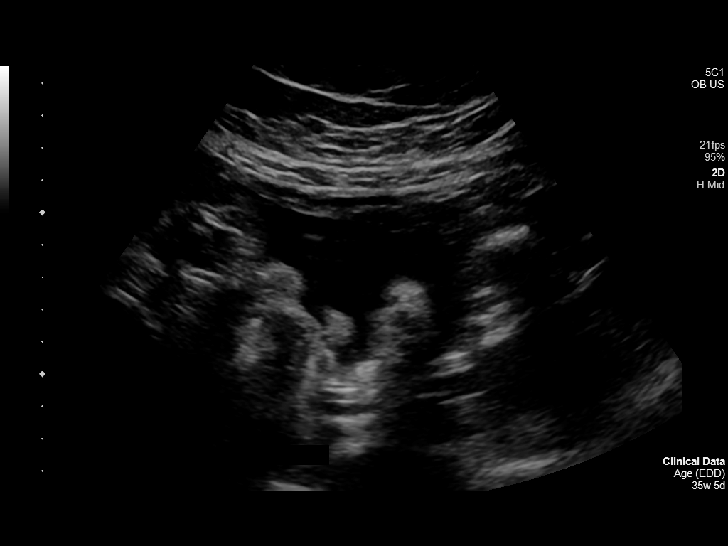
[im 7/38]
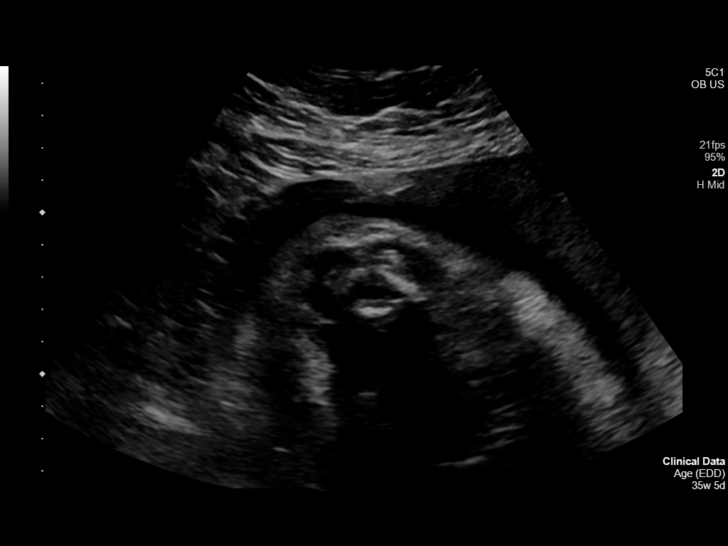
[im 11/38]
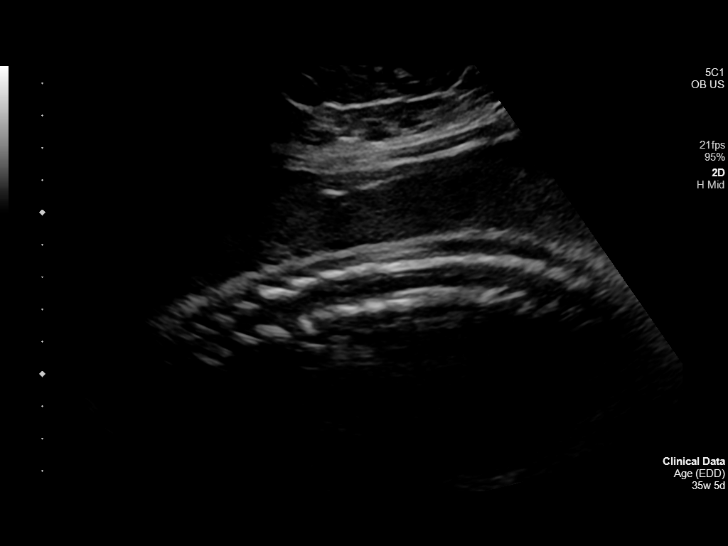
[im 14/38]
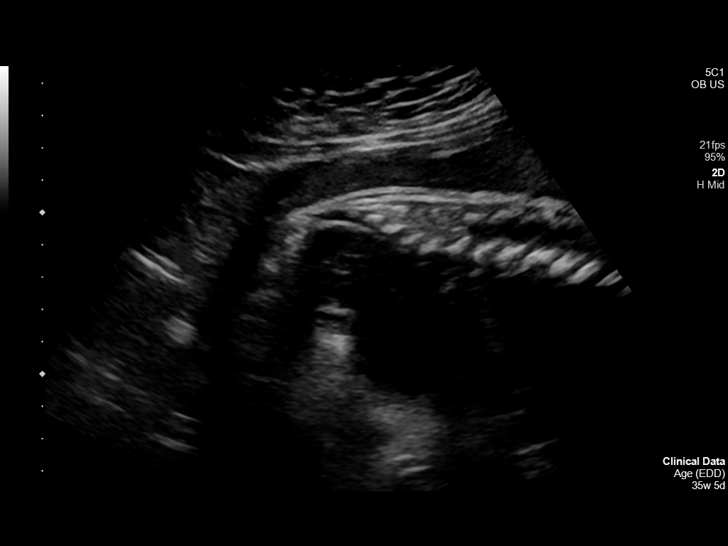
[im 17/38]
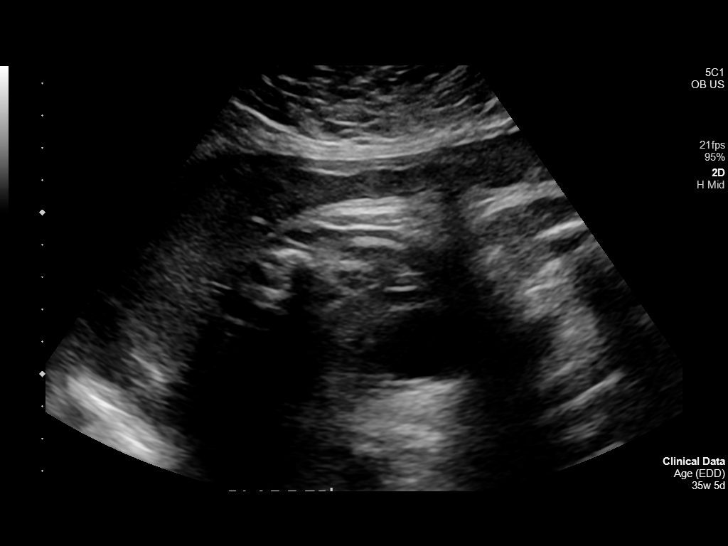
[im 21/38]
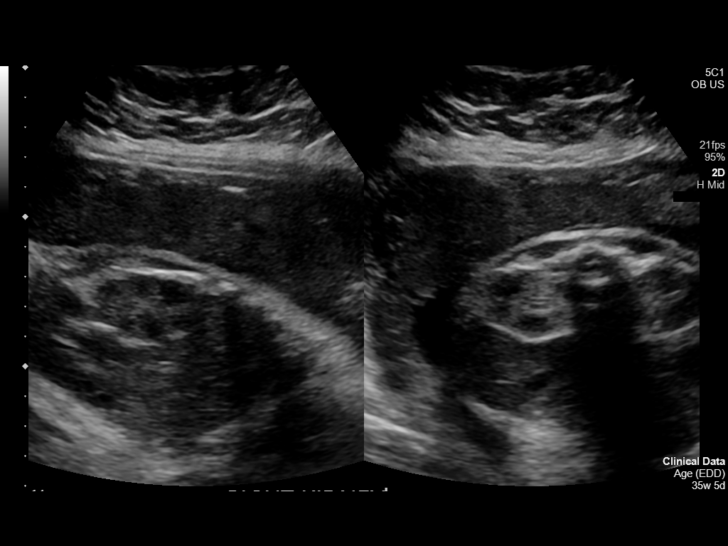
[im 24/38]
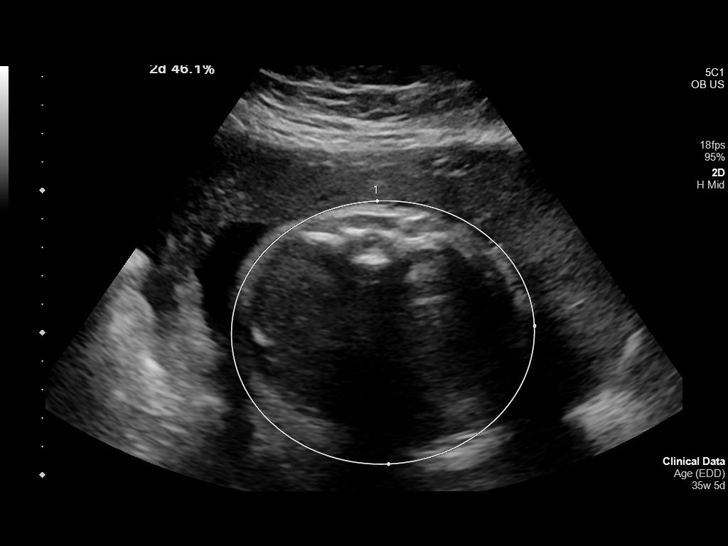
[im 27/38]
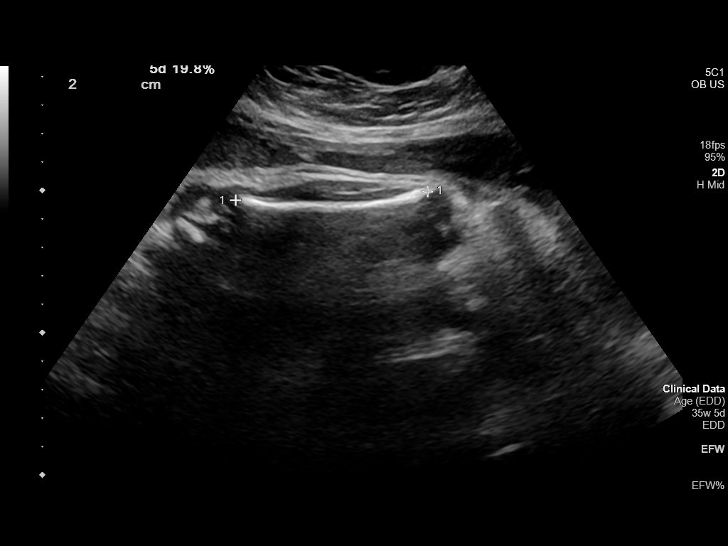
[im 31/38]
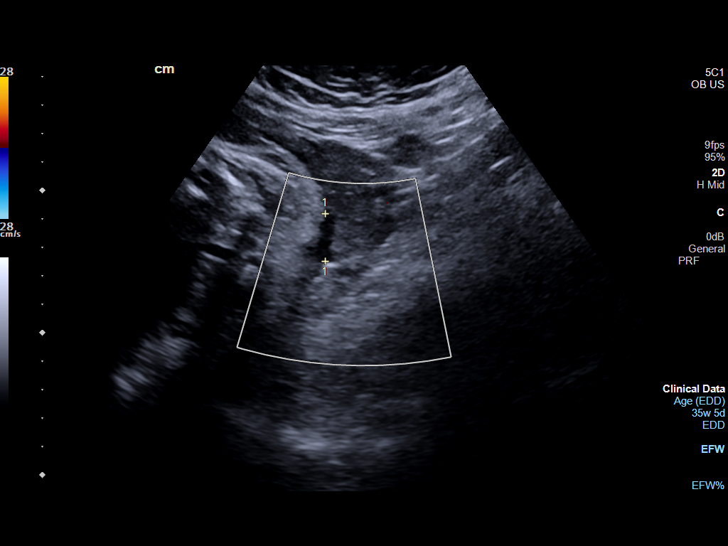
[im 33/38]
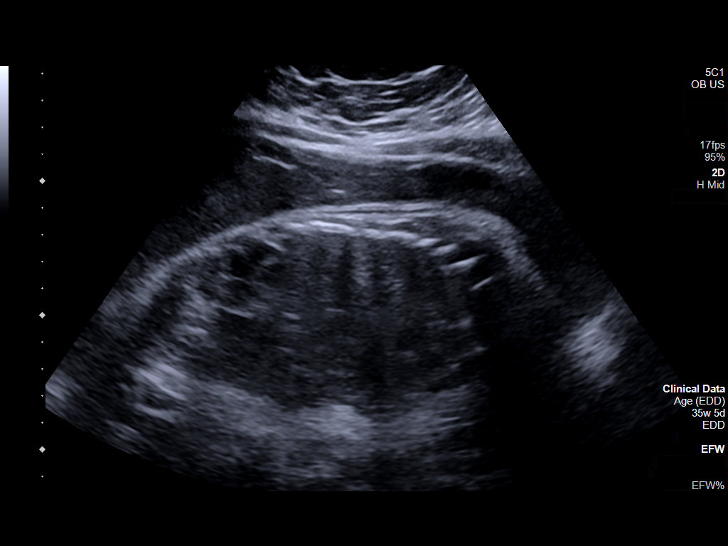
[im 36/38]
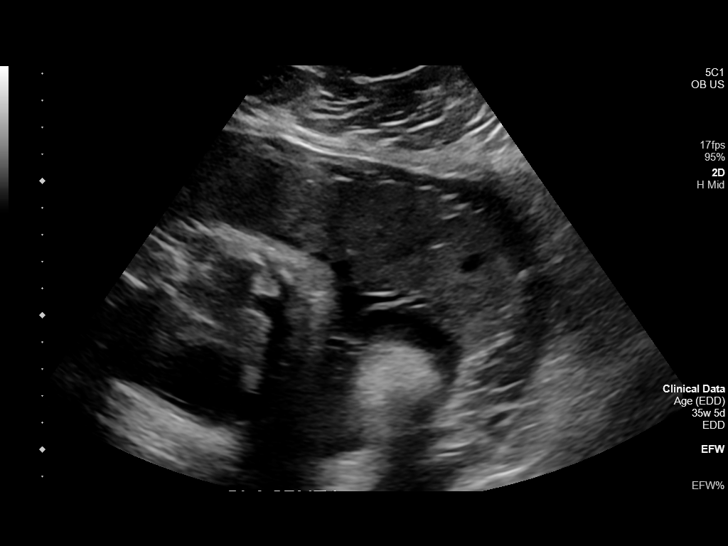

[12 of 28 positions shown; findings below may reference images not displayed]

[REDACTED]
                   CAMBEROS

Indications

 35 weeks gestation of pregnancy
 Obesity complicating pregnancy, third
 trimester
 Hypertension - Chronic/Pre-existing
 Gestational diabetes in pregnancy,
 controlled by oral hypoglycemic drugs
Fetal Evaluation

 Num Of Fetuses:         1
 Fetal Heart Rate(bpm):  125
 Cardiac Activity:       Observed
 Presentation:           Cephalic
 Placenta:               Anterior
 P. Cord Insertion:      Previously Visualized

 Amniotic Fluid
 AFI FV:

 AFI Sum(cm)     %Tile       Largest Pocket(cm)
 12.4            39          5

 RUQ(cm)       RLQ(cm)       LUQ(cm)        LLQ(cm)
 5             3
Biophysical Evaluation

 Amniotic F.V:   Within normal limits       F. Tone:        Observed
 F. Movement:    Observed                   Score:          [DATE]
 F. Breathing:   Observed
Biometry

 BPD:      92.2  mm     G. Age:  37w 3d         93  %    CI:        80.18   %    70 - 86
                                                         FL/HC:      20.8   %    20.1 -
 HC:      325.3  mm     G. Age:  36w 6d         46  %    HC/AC:      1.05        0.93 -
 AC:      311.2  mm     G. Age:  35w 0d         40  %    FL/BPD:     73.4   %    71 - 87
 FL:       67.7  mm     G. Age:  34w 6d         22  %    FL/AC:      21.8   %    20 - 24

 Est. FW:    0411  gm    5 lb 14 oz      41  %
Gestational Age

 LMP:           35w 5d        Date:  11/09/19                 EDD:   08/15/20
 Clinical EDD:  35w 5d                                        EDD:   08/15/20
 U/S Today:     36w 0d                                        EDD:   08/13/20
 Best:          35w 5d     Det. By:  LMP  (11/09/19)          EDD:   08/15/20
Anatomy

 Cranium:               Previously seen        LVOT:                   Previously seen
 Cavum:                 Previously seen        Aortic Arch:            Previously seen
 Ventricles:            Previously seen        Ductal Arch:            Not well visualized
 Choroid Plexus:        Previously seen        Diaphragm:              Appears normal
 Cerebellum:            Previously seen        Stomach:                Appears normal, left
                                                                       sided
 Posterior Fossa:       Previously seen        Abdomen:                Previously seen
 Nuchal Fold:           Not applicable (>20    Abdominal Wall:         Previously seen
                        wks GA)
 Face:                  Orbits and profile     Cord Vessels:           Previously seen
                        previously seen
 Lips:                  Appears normal         Kidneys:                Appear normal
 Palate:                Previously seen        Bladder:                Appears normal
 Thoracic:              Previously seen        Spine:                  Appears normal
 Heart:                 Previously seen        Upper Extremities:      Previously seen
 RVOT:                  Previously seen        Lower Extremities:      Previously seen
Impression

 Follow up growth due 7UGTN and chronic hypertension on
 Labetalol
 Normal interval growth with measurements consistent with
 dates
 Good fetal movement and amniotic fluid volume
 Biophysical profile was [DATE] today.

 Her blood pressure 131/89 mmHg is normal. She denies s/sx
 of preeclampsia.
 She reports that her blood sugars are well controlled.

 Continue weekly testing

 Consider delivery between 37-38 week.

## 2021-07-20 LAB — CYTOLOGY - PAP: Diagnosis: NEGATIVE

## 2021-08-18 ENCOUNTER — Ambulatory Visit (INDEPENDENT_AMBULATORY_CARE_PROVIDER_SITE_OTHER): Payer: Medicaid Other | Admitting: Nurse Practitioner

## 2021-08-18 ENCOUNTER — Encounter: Payer: Self-pay | Admitting: Nurse Practitioner

## 2021-08-18 VITALS — BP 140/80 | HR 100 | Ht 65.5 in | Wt 265.0 lb

## 2021-08-18 DIAGNOSIS — G4730 Sleep apnea, unspecified: Secondary | ICD-10-CM | POA: Diagnosis not present

## 2021-08-18 DIAGNOSIS — I10 Essential (primary) hypertension: Secondary | ICD-10-CM | POA: Diagnosis not present

## 2021-08-18 DIAGNOSIS — Z01812 Encounter for preprocedural laboratory examination: Secondary | ICD-10-CM

## 2021-08-18 DIAGNOSIS — I493 Ventricular premature depolarization: Secondary | ICD-10-CM | POA: Diagnosis not present

## 2021-08-18 MED ORDER — METOPROLOL TARTRATE 25 MG PO TABS: 25.0000 mg | ORAL_TABLET | Freq: Two times a day (BID) | ORAL | 1 refills | Status: DC

## 2021-08-18 NOTE — Progress Notes (Signed)
Office Visit    Patient Name: ALAISHA EVERSLEY Date of Encounter: 08/18/2021  Primary Care Provider:  [facility] Primary Cardiologist:  Yvonne Kendall, MD  Chief Complaint    21 y/o ? w/ a h/o HTN, palpitations, frequent PVCs, anxiety, depression, and atypical chest pain, who presents for follow-up related to frequent PVCs.  Past Medical History    Past Medical History:  Diagnosis Date   Anxiety    Atypical chest pain    Depression    Gestational diabetes    taking glyburide   Heart murmur    mild mitral regurigation by echo   Hypertension    IBS (irritable bowel syndrome)    Mitral regurgitation    a. 03/2020 Echo: EF 60-65%, no rwma, nl RV fxn. Mild MR.   Morbid obesity (HCC)    Orthodontics    wears braces   Palpitations    Pregnancy induced hypertension    PVCs (premature ventricular contractions)    a. 06/2021 Zio: Avg HR 94 (51-152). Isolated PVCs w/ burden of 22%. Triggered events = sinus rhythm w/ PVCs.   Past Surgical History:  Procedure Laterality Date   ADENOIDECTOMY  2005   NASAL TURBINATE REDUCTION Left 06/28/2016   Procedure: TURBINATE REDUCTION/SUBMUCOSAL RESECTION;  Surgeon: Geanie Logan, MD;  Location: [facility];  Service: ENT;  Laterality: Left;   SEPTOPLASTY N/A 06/28/2016   Procedure: SEPTOPLASTY;  Surgeon: Geanie Logan, MD;  Location: [facility];  Service: ENT;  Laterality: N/A;   TONSILLECTOMY  2005   TONSILLECTOMY      Allergies  No Known Allergies  History of Present Illness    21 year old female with above past medical history including hypertension, palpitations, frequent PVCs, anxiety, depression, and atypical chest pain.  History of hypertension was previously diagnosed while she was in middle school.  At the same time, she was diagnosed with a heart murmur and has been followed with serial echocardiograms.  She establish care with our office in December 2021 in the setting of pregnancy.  Echocardiogram in  January 2022 showed an EF of 60 to 65% without regional wall motion abnormalities, and mild mitral regurgitation.  She was placed on labetalol therapy for hypertension though following her pregnancy, this was subsequently discontinued.  In April of this year, she was seen in clinic and noted to have frequent PVCs along with complaints of palpitations and occasional lightheadedness.  She also reported random, fleeting sharp chest pain at rest.  A Zio monitor was placed to better characterize her PVC burden and this showed isolated PVCs with a burden of 22%.  Triggered events equated to sinus rhythm with PVCs.  No sustained arrhythmia or pauses were noted.  Since her last visit, she has felt reasonably well.  She notes that she is often fatigued throughout the day, even when her daughter sleeps to the night and she can get a decent night sleep.  She does have a history of snoring.  She is interested in a sleep study.  With regards to palpitations, she notes these periodically, especially when it is very quiet, like when she is laying in bed.  She had 1 episode during the monitoring period, where she felt lightheaded and triggered the monitor.  As above, there were no sustained arrhythmias during the monitoring period.  She has not had any chest pain and denies dyspnea, PND, orthopnea, syncope, or early satiety.  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  Home Medications    Current Outpatient Medications  Medication Sig Dispense Refill   acetaminophen (TYLENOL) 325 MG tablet Take 325 mg by mouth as needed.     ibuprofen (ADVIL) 200 MG tablet Take 200 mg by mouth as needed.     levonorgestrel (MIRENA) 20 MCG/DAY IUD 1 each by Intrauterine route once. Inserted 11/06/2020 [name]     No current facility-administered medications for this visit.     Review of Systems    Notable for palpitations and mild lower  extremity swelling at the end of the day.  Occasional lightheadedness.  She denies chest pain, dyspnea, PND, orthopnea, syncope, or early satiety.  All other systems reviewed and are otherwise negative except as noted above.    Physical Exam    VS:  BP 140/80 (BP Location: Left Arm, Patient Position: Sitting, Cuff Size: Large)   Pulse 100   Ht 5' 5.5" (1.664 m)   Wt 265 lb (120.2 kg)   SpO2 98%   BMI 43.43 kg/m  , BMI Body mass index is 43.43 kg/m. STOP-Bang Score:  5      GEN: Obese, in no acute distress. HEENT: normal. Neck: Supple, obese, difficult to gauge JVP.  No bruits or masses.   Cardiac: Irregular in the setting of frequent ectopy.  2/6 systolic murmur loudest at the left lower sternal border to the the apex.  No rubs or gallops. No clubbing, cyanosis, edema.  Radials/DP/PT 2+ and equal bilaterally.  Respiratory:  Respirations regular and unlabored, clear to auscultation bilaterally. GI: Obese, soft, nontender, nondistended, BS + x 4. MS: no deformity or atrophy. Skin: warm and dry, no rash. Neuro:  Strength and sensation are intact. Psych: Normal affect.  Accessory Clinical Findings    Lab Results  Component Value Date   WBC 8.8 04/28/2021   HGB 14.4 04/28/2021   HCT 42.4 04/28/2021   MCV 82 04/28/2021   PLT 270 04/28/2021   Lab Results  Component Value Date   CREATININE 0.59 04/28/2021   BUN 8 04/28/2021   NA 138 04/28/2021   K 4.6 04/28/2021   CL 101 04/28/2021   CO2 18 (L) 04/28/2021   Lab Results  Component Value Date   ALT 11 03/06/2020   AST 10 03/06/2020   ALKPHOS 56 03/06/2020   BILITOT <0.2 03/06/2020   Lab Results  Component Value Date   CHOL 164 01/22/2013   HDL 54 01/22/2013   LDLCALC 64 01/22/2013   TRIG 231 (H) 01/22/2013    Lab Results  Component Value Date   HGBA1C 5.9 (H) 01/01/2020    Assessment & Plan    1.  Frequent PVCs: Patient with a history of palpitations and recent monitoring showing a 22% isolated PVC burden  without sustained arrhythmias.  She occasionally experiences lightheadedness during these episodes with triggered events showing sinus rhythm with PVCs.  Previous labs checked in February in the setting of PVCs, were unremarkable.  Echo in early 2022 showed normal LV function with mild MR.  Monitoring results and plan moving forward discussed at length today.  Adding metoprolol 25 mg twice daily.  We will arrange for cardiac MRI to rule out infiltrative disease.  I will have her follow-up with Dr. Lalla BrothersLambert with electrophysiology and have also placed a pulmonary referral for sleep study in the setting of daytime somnolence, snoring, and a STOP-BANG of 5.  2.  Essential hypertension: Blood pressure  elevated today at 140/80.  Heart rate 100.  In the setting of above, adding metoprolol 25 mg twice daily.  3.  Sleep disordered breathing: Patient notes daytime somnolence and snoring.  She has a history of hypertension dating back to middle school and a BMI of 43.43.  We will refer to pulmonology for sleep evaluation.  4.  Disposition: Follow-up cardiac MRI.  Patient will follow-up with electrophysiology and pulmonology as outlined above.  Nicolasa Ducking, NP 08/18/2021, 2:00 PM

## 2021-08-18 NOTE — Patient Instructions (Addendum)
Medication Instructions:  - Your physician has recommended you make the following change in your medication:   1) START Metoprolol tartrate 25 mg: - take 1 tablet by mouth TWICE daily    *If you need a refill on your cardiac medications before your next appointment, please call your pharmacy*   Lab Work: - Your physician recommends that you return for lab work prior to your Cardiac MRI: CBC  Medical Mall Entrance at [facility] 1st desk on the right to check in (REGISTRATION)  Lab hours: Monday- Friday (7:30 am- 5:30 pm)    If you have labs (blood work) drawn today and your tests are completely normal, you will receive your results only by: MyChart Message (if you have MyChart) OR A paper copy in the mail If you have any lab test that is abnormal or we need to change your treatment, we will call you to review the results.   Testing/Procedures:  1) You have been referred to: Dr. Steffanie Dunn- management of PVC's  2) You have been referred to: [facility]- for evaluation of sleep apnea  3) Cardiac MRI: - Your physician has requested that you have a cardiac MRI. Cardiac MRI uses a computer to create images of your heart as its beating, producing both still and moving pictures of your heart and major blood vessels.    You are scheduled for Cardiac MRI on ______________. Please arrive for your appointment at ______________ ( arrive 30-45 minutes prior to test start time). ?  [facility] [address] [place], [place] [address] [phone] Please take advantage of the free valet parking available at the MAIN entrance (A entrance).  Proceed to the [facility] Radiology Department (First Floor) for check-in.   OR   [facility] [address] [place], [place] [address] [phone] Please take advantage of the free valet parking available at the MAIN entrance. Proceed to [facility] registration for check-in (first floor).  Magnetic  resonance imaging (MRI) is a painless test that produces images of the inside of the body without using Xrays.  During an MRI, strong magnets and radio waves work together in a Data processing manager to form detailed images.   MRI images may provide more details about a medical condition than X-rays, CT scans, and ultrasounds can provide.  You may be given earphones to listen for instructions.  You may eat a light breakfast and take medications as ordered with the exception of HCTZ (fluid pill, other). Please avoid stimulants for 12 hr prior to test. (Ie. Caffeine, nicotine, chocolate, or antihistamine medications)  If a contrast material will be used, an IV will be inserted into one of your veins. Contrast material will be injected into your IV. It will leave your body through your urine within a day. You may be told to drink plenty of fluids to help flush the contrast material out of your system.  You will be asked to remove all metal, including: Watch, jewelry, and other metal objects including hearing aids, hair pieces and dentures. Also wearable glucose monitoring systems (ie. Freestyle Libre and Omnipods) (Braces and fillings normally are not a problem.)   TEST WILL TAKE APPROXIMATELY 1 HOUR  PLEASE NOTIFY SCHEDULING AT LEAST 24 HOURS IN ADVANCE IF YOU ARE UNABLE TO KEEP YOUR APPOINTMENT. [phone]  Please call Rockwell Alexandria, cardiac imaging nurse navigator with any questions/concerns. Rockwell Alexandria RN Navigator Cardiac Imaging Larey Brick RN Navigator Cardiac Imaging [facility] [phone] Office  Follow-Up: At [facility], you and your health needs are our priority.  As part of our continuing mission to provide you with exceptional heart care, we have created designated Provider Care Teams.  These Care Teams include your primary Cardiologist (physician) and Advanced Practice Providers (APPs -  Physician Assistants and Nurse Practitioners) who  all work together to provide you with the care you need, when you need it.  We recommend signing up for the patient portal called "MyChart".  Sign up information is provided on this After Visit Summary.  MyChart is used to connect with patients for Virtual Visits (Telemedicine).  Patients are able to view lab/test results, encounter notes, upcoming appointments, etc.  Non-urgent messages can be sent to your provider as well.   To learn more about what you can do with MyChart, go to [email or web address].    Your next appointment:   Next Available   The format for your next appointment:   In Person  Provider:   Steffanie Dunn, MD    Other Instructions N/a  Important Information About Sugar

## 2021-09-06 ENCOUNTER — Other Ambulatory Visit (HOSPITAL_COMMUNITY): Payer: Self-pay | Admitting: *Deleted

## 2021-09-06 ENCOUNTER — Telehealth: Payer: Self-pay | Admitting: Internal Medicine

## 2021-09-06 ENCOUNTER — Telehealth (HOSPITAL_COMMUNITY): Payer: Self-pay | Admitting: *Deleted

## 2021-09-06 DIAGNOSIS — I493 Ventricular premature depolarization: Secondary | ICD-10-CM

## 2021-09-06 DIAGNOSIS — Z01812 Encounter for preprocedural laboratory examination: Secondary | ICD-10-CM

## 2021-09-06 MED ORDER — DIAZEPAM 2 MG PO TABS: 2.0000 mg | ORAL_TABLET | ORAL | 0 refills | Status: DC

## 2021-09-06 NOTE — Telephone Encounter (Signed)
Reaching out to patient to offer assistance regarding upcoming cardiac imaging study; pt verbalizes understanding of appt date/time, parking situation and where to check in, and verified current allergies; name and call back number provided for further questions should they arise  Gordy Clement RN Navigator Cardiac Imaging [facility] [phone] office [phone] cell  Patient is aware to obtain labs prior to MRI and reports claustrophobia. She and I will both reach out for medications for MRI.

## 2021-09-06 NOTE — Telephone Encounter (Signed)
Patient is scheduled for cardiac MRI this week and reports claustrophobia.  I will send in a prescription for diazepam 2 mg PO x 1 (can be repeated once) for anxiolysis.  Yvonne Kendall, MD [facility]

## 2021-09-07 ENCOUNTER — Other Ambulatory Visit
Admission: RE | Admit: 2021-09-07 | Discharge: 2021-09-07 | Disposition: A | Discharge status: Home / Self Care | Payer: Medicaid Other | Attending: Nurse Practitioner | Admitting: Nurse Practitioner

## 2021-09-07 DIAGNOSIS — Z01812 Encounter for preprocedural laboratory examination: Secondary | ICD-10-CM | POA: Diagnosis present

## 2021-09-07 DIAGNOSIS — I493 Ventricular premature depolarization: Secondary | ICD-10-CM | POA: Diagnosis not present

## 2021-09-07 LAB — HEMOGLOBIN AND HEMATOCRIT, BLOOD
HCT: 41.7 % (ref 36.0–46.0)
Hemoglobin: 13.8 g/dL (ref 12.0–15.0)

## 2021-09-08 ENCOUNTER — Ambulatory Visit
Admission: RE | Admit: 2021-09-08 | Discharge: 2021-09-08 | Disposition: A | Discharge status: Home / Self Care | Payer: Medicaid Other | Source: Ambulatory Visit | Attending: Nurse Practitioner | Admitting: Nurse Practitioner

## 2021-09-08 DIAGNOSIS — I493 Ventricular premature depolarization: Secondary | ICD-10-CM | POA: Diagnosis present

## 2021-09-08 MED ORDER — GADOBUTROL 1 MMOL/ML IV SOLN
15.0000 mL | Freq: Once | INTRAVENOUS | Status: AC | PRN
  Administered 2021-09-08: 7.5 mL via INTRAVENOUS

## 2021-10-06 ENCOUNTER — Emergency Department: Payer: Medicaid Other

## 2021-10-06 ENCOUNTER — Telehealth: Payer: Self-pay | Admitting: Obstetrics and Gynecology

## 2021-10-06 ENCOUNTER — Emergency Department
Admission: EM | Admit: 2021-10-06 | Discharge: 2021-10-06 | Disposition: A | Discharge status: Home / Self Care | Payer: Medicaid Other | Attending: Emergency Medicine | Admitting: Emergency Medicine

## 2021-10-06 ENCOUNTER — Encounter: Payer: Self-pay | Admitting: Obstetrics and Gynecology

## 2021-10-06 ENCOUNTER — Other Ambulatory Visit: Payer: Self-pay

## 2021-10-06 DIAGNOSIS — N939 Abnormal uterine and vaginal bleeding, unspecified: Secondary | ICD-10-CM | POA: Diagnosis not present

## 2021-10-06 DIAGNOSIS — R197 Diarrhea, unspecified: Secondary | ICD-10-CM | POA: Diagnosis not present

## 2021-10-06 DIAGNOSIS — N730 Acute parametritis and pelvic cellulitis: Secondary | ICD-10-CM

## 2021-10-06 DIAGNOSIS — R531 Weakness: Secondary | ICD-10-CM | POA: Diagnosis not present

## 2021-10-06 DIAGNOSIS — R1030 Lower abdominal pain, unspecified: Secondary | ICD-10-CM | POA: Diagnosis present

## 2021-10-06 DIAGNOSIS — I10 Essential (primary) hypertension: Secondary | ICD-10-CM

## 2021-10-06 DIAGNOSIS — B9689 Other specified bacterial agents as the cause of diseases classified elsewhere: Secondary | ICD-10-CM

## 2021-10-06 DIAGNOSIS — R102 Pelvic and perineal pain: Secondary | ICD-10-CM

## 2021-10-06 LAB — URINALYSIS, ROUTINE W REFLEX MICROSCOPIC
Bilirubin Urine: NEGATIVE
Glucose, UA: NEGATIVE mg/dL
Ketones, ur: NEGATIVE mg/dL
Nitrite: NEGATIVE
Protein, ur: NEGATIVE mg/dL
Specific Gravity, Urine: 1.021 (ref 1.005–1.030)
pH: 5 (ref 5.0–8.0)

## 2021-10-06 LAB — CBC
HCT: 43.9 % (ref 36.0–46.0)
Hemoglobin: 14.6 g/dL (ref 12.0–15.0)
MCH: 28.6 pg (ref 26.0–34.0)
MCHC: 33.3 g/dL (ref 30.0–36.0)
MCV: 86.1 fL (ref 80.0–100.0)
Platelets: 293 10*3/uL (ref 150–400)
RBC: 5.1 MIL/uL (ref 3.87–5.11)
RDW: 12 % (ref 11.5–15.5)
WBC: 14.5 10*3/uL — ABNORMAL HIGH (ref 4.0–10.5)
nRBC: 0 % (ref 0.0–0.2)

## 2021-10-06 LAB — WET PREP, GENITAL
Sperm: NONE SEEN
Trich, Wet Prep: NONE SEEN
WBC, Wet Prep HPF POC: >=10 — AB (ref <10)
Yeast Wet Prep HPF POC: NONE SEEN

## 2021-10-06 LAB — COMPREHENSIVE METABOLIC PANEL
ALT: 25 U/L (ref 0–44)
AST: 21 U/L (ref 15–41)
Albumin: 4.5 g/dL (ref 3.5–5.0)
Alkaline Phosphatase: 47 U/L (ref 38–126)
Anion gap: 8 (ref 5–15)
BUN: 11 mg/dL (ref 6–20)
CO2: 24 mmol/L (ref 22–32)
Calcium: 9.6 mg/dL (ref 8.9–10.3)
Chloride: 106 mmol/L (ref 98–111)
Creatinine, Ser: 0.61 mg/dL (ref 0.44–1.00)
GFR, Estimated: >60 mL/min (ref >60)
Glucose, Bld: 98 mg/dL (ref 70–99)
Potassium: 4 mmol/L (ref 3.5–5.1)
Sodium: 138 mmol/L (ref 135–145)
Total Bilirubin: 0.6 mg/dL (ref 0.3–1.2)
Total Protein: 7.9 g/dL (ref 6.5–8.1)

## 2021-10-06 LAB — CHLAMYDIA/NGC RT PCR (ARMC ONLY)
Chlamydia Tr: NOT DETECTED
N gonorrhoeae: NOT DETECTED

## 2021-10-06 LAB — POC URINE PREG, ED: Preg Test, Ur: NEGATIVE

## 2021-10-06 LAB — LIPASE, BLOOD: Lipase: 24 U/L (ref 11–51)

## 2021-10-06 MED ORDER — METRONIDAZOLE 500 MG PO TABS: 500.0000 mg | ORAL_TABLET | Freq: Two times a day (BID) | ORAL | 0 refills | Status: AC

## 2021-10-06 MED ORDER — CEFTRIAXONE SODIUM 1 G IJ SOLR: 500.0000 mg | Freq: Once | INTRAMUSCULAR | Status: DC

## 2021-10-06 MED ORDER — KETOROLAC TROMETHAMINE 15 MG/ML IJ SOLN
15.0000 mg | Freq: Once | INTRAMUSCULAR | Status: AC
  Administered 2021-10-06: 15 mg via INTRAVENOUS
  Filled 2021-10-06: qty 1

## 2021-10-06 MED ORDER — IOHEXOL 350 MG/ML SOLN
75.0000 mL | Freq: Once | INTRAVENOUS | Status: AC | PRN
  Administered 2021-10-06: 75 mL via INTRAVENOUS

## 2021-10-06 MED ORDER — SODIUM CHLORIDE 0.9 % IV SOLN
1.0000 g | Freq: Once | INTRAVENOUS | Status: AC
  Administered 2021-10-06: 1 g via INTRAVENOUS
  Filled 2021-10-06: qty 10

## 2021-10-06 MED ORDER — DOXYCYCLINE HYCLATE 100 MG PO CAPS: 100.0000 mg | ORAL_CAPSULE | Freq: Two times a day (BID) | ORAL | 0 refills | Status: AC

## 2021-10-06 NOTE — Discharge Instructions (Addendum)
Your blood pressure was noted to be slightly elevated today.  Please have this rechecked at your follow-up visit.

## 2021-10-06 NOTE — ED Notes (Signed)
Patient transported to US 

## 2021-10-06 NOTE — ED Provider Triage Note (Signed)
Emergency Medicine Provider Triage Evaluation Note  Stacy Soto , a 21 y.o. female  was evaluated in triage.  Pt complains of right-sided abdominal pain.  Review of Systems  Positive: Right side abdominal pain, loose stools Negative: Fever chills  Physical Exam  BP (!) 135/101 (BP Location: Right Arm)   Pulse 80   Temp 98.7 F (37.1 C) (Oral)   Resp 18   SpO2 98%  Gen:   Awake, no distress   Resp:  Normal effort  MSK:   Moves extremities without difficulty  Other:  Abdomen tender right lower quadrant  Medical Decision Making  Medically screening exam initiated at 1:03 PM.  Appropriate orders placed.  Stacy Soto was informed that the remainder of the evaluation will be completed by another provider, this initial triage assessment does not replace that evaluation, and the importance of remaining in the ED until their evaluation is complete.  CT abdomen/pelvis IV contrast to rule out acute appendicitis   Faythe Ghee, PA-C 10/06/21 1304

## 2021-10-06 NOTE — ED Triage Notes (Signed)
Pt c/o suprapubic pain with diarrhea for the past 2-3 days, states "I feel like my uterus is going to fall out". Denies pregnancy, has an IUD.

## 2021-10-06 NOTE — ED Provider Notes (Signed)
I assumed care of this patient at approximately 1500 with plan to follow-up pelvic ultrasound.  Patient is coming in with some suprapubic discomfort with a reassuring CT without evidence of other clear source of patient's pain and very small follicles noted in the adnexa but no other acute abnormality.  Wet prep does show clue cells.  GC studies were sent.  No suspicion for sepsis at this time.  Initial treat with Rocephin Flagyl and course of Doxy.  Advised patient she can follow-up for gonorrhea and chlamydia studies online or with her OB/GYN.  Ultrasound shows IUD in place without evidence of torsion or abscess.  I think patient can safely be discharged with outpatient follow-up.  She has no other acute concerns.  Antibiotic prescriptions written.  Discharged in stable condition.   Gilles Chiquito, MD 10/06/21 [phone]

## 2021-10-06 NOTE — Telephone Encounter (Signed)
Pt called states "pain is worse". Pt states  " originally thought pain may have came from IUD insertion" . Pt states she believes it is something more and it feels as if her uterus is going to fall out. Pt states the pain is unbearable when she moves. Consulted CMA - advised pt to seek evaluation at Urgent Care or [facility]. Pt verbalized understanding and a follow up appointment was scheduled for 07/27

## 2021-10-06 NOTE — ED Provider Notes (Signed)
[facility] Provider Note    Event Date/Time   First MD Initiated Contact with Patient 10/06/21 1414     (approximate)   History   Abdominal Pain   HPI  Stacy Soto is a 21 y.o. female  who presents to the emergency department today with suprapubic pain. The pain started 3 days ago.  She describes it as being severe.  It is worse with movement and stretching.  Patient states that along with the pain she feels like her uterus is going to fall out.  This has been accompanied by some diarrhea.  Patient denies any significant nausea or vomiting.  She denies any fevers.  She does feel some generalized weakness with the pain.  She has not noticed any unusual vaginal discharge.  Has had some bleeding with dime sized blood clots which she states has occurred since having her IUD placed.      Physical Exam   Triage Vital Signs: ED Triage Vitals  Enc Vitals Group     BP 10/06/21 1245 (!) 135/101     Pulse Rate 10/06/21 1245 80     Resp 10/06/21 1245 18     Temp 10/06/21 1245 98.7 F (37.1 C)     Temp Source 10/06/21 1245 Oral     SpO2 10/06/21 1245 98 %     Weight --      Height --      Head Circumference --      Peak Flow --      Pain Score 10/06/21 1413 7     Pain Loc --      Pain Edu? --      Excl. in GC? --     Most recent vital signs: Vitals:   10/06/21 1245 10/06/21 1415  BP: (!) 135/101 (!) 167/113  Pulse: 80 96  Resp: 18 19  Temp: 98.7 F (37.1 C)   SpO2: 98% 100%    General: Awake, alert, oriented. CV:  Good peripheral perfusion. Regular rate and rhythm. Resp:  Normal effort. Lungs clear. Abd:  No distention. Tender to palpation across the lower abdomen.   ED Results / Procedures / Treatments   Labs (all labs ordered are listed, but only abnormal results are displayed) Labs Reviewed  CBC - Abnormal; Notable for the following components:      Result Value   WBC 14.5 (*)    All other components within normal limits   URINALYSIS, ROUTINE W REFLEX MICROSCOPIC - Abnormal; Notable for the following components:   Color, Urine YELLOW (*)    APPearance HAZY (*)    Hgb urine dipstick LARGE (*)    Leukocytes,Ua SMALL (*)    Bacteria, UA RARE (*)    All other components within normal limits  WET PREP, GENITAL  CHLAMYDIA/NGC RT PCR ([facility] ONLY)            LIPASE, BLOOD  COMPREHENSIVE METABOLIC PANEL  POC URINE PREG, ED     EKG  I, Phineas Semen, attending physician, personally viewed and interpreted this EKG  EKG Time: 1253 Rate: 99 Rhythm: sinus rhythm with pvc Axis: normal Intervals: qtc 449 QRS: narrow, q waves v1, v2 ST changes: no st elevation Impression: abnormal ekg   RADIOLOGY I independently interpreted and visualized the CT abd/pel. My interpretation: no free air Radiology interpretation:    IMPRESSION:  No acute findings are seen in CT scan of abdomen and pelvis.    Enlarged fatty liver.  Slightly enlarged spleen.  There is trace amount of right pleural effusion.    Other findings as described in the body of the report.      PROCEDURES:  Critical Care performed: No  Procedures   MEDICATIONS ORDERED IN ED: Medications  iohexol (OMNIPAQUE) 350 MG/ML injection 75 mL (75 mLs Intravenous Contrast Given 10/06/21 1347)     IMPRESSION / MDM / ASSESSMENT AND PLAN / ED COURSE  I reviewed the triage vital signs and the nursing notes.                              Differential diagnosis includes, but is not limited to, UTI, enteritis, appendicitis, ovarian pathology.  Patient's presentation is most consistent with acute presentation with potential threat to life or bodily function.  Patient presents to the emergency department today with 3-day history of severe suprapubic pain which is worse with movement.  On exam she is tender in the suprapubic region.  CT exam ordered through triage without any acute concerning abnormalities.  Given location of the patient's pain will  plan on obtaining ultrasound to evaluate for potential ovarian or uterine causes.  Additionally will check wet prep. If work up continues to be negative do feel patient could continue work up as outpatient through her ob/gyn.  FINAL CLINICAL IMPRESSION(S) / ED DIAGNOSES   Final diagnoses:  Suprapubic pain    Note:  This document was prepared using Dragon voice recognition software and may include unintentional dictation errors.    Phineas Semen, MD 10/06/21 [phone]

## 2021-10-12 ENCOUNTER — Ambulatory Visit (INDEPENDENT_AMBULATORY_CARE_PROVIDER_SITE_OTHER): Payer: Medicaid Other | Admitting: Adult Health

## 2021-10-12 ENCOUNTER — Telehealth: Payer: Self-pay | Admitting: Internal Medicine

## 2021-10-12 ENCOUNTER — Encounter: Payer: Self-pay | Admitting: Adult Health

## 2021-10-12 VITALS — BP 132/78 | HR 90 | Temp 98.0°F | Ht 65.5 in | Wt 264.6 lb

## 2021-10-12 DIAGNOSIS — E669 Obesity, unspecified: Secondary | ICD-10-CM

## 2021-10-12 DIAGNOSIS — R400 Somnolence: Secondary | ICD-10-CM | POA: Insufficient documentation

## 2021-10-12 DIAGNOSIS — G4719 Other hypersomnia: Secondary | ICD-10-CM | POA: Diagnosis not present

## 2021-10-12 NOTE — Assessment & Plan Note (Signed)
Daytime sleepiness, snoring , BMI 43, restless sleep , insomnia , pvcs, HTN , headaches . Suspicious for OSA   - discussed how weight can impact sleep and risk for sleep disordered breathing - discussed options to assist with weight loss: combination of diet modification, cardiovascular and strength training exercises   - had an extensive discussion regarding the adverse health consequences related to untreated sleep disordered breathing - specifically discussed the risks for hypertension, coronary artery disease, cardiac dysrhythmias, cerebrovascular disease, and diabetes - lifestyle modification discussed   - discussed how sleep disruption can increase risk of accidents, particularly when driving - safe driving practices were discussed   Plan  Patient Instructions  Set up home sleep study.  Work on healthy sleep regimen  Do not drive if sleepy  Work on healthy weight  Follow up in 6 weeks to discuss results and treatment plan.

## 2021-10-12 NOTE — Patient Instructions (Signed)
Set up home sleep study.  Work on healthy sleep regimen  Do not drive if sleepy  Work on healthy weight  Follow up in 6 weeks to discuss results and treatment plan.

## 2021-10-12 NOTE — Progress Notes (Signed)
'@Patient'  ID: Stacy Soto, female    DOB: 24-Oct-2000, 21 y.o.   MRN: [id number]  Chief Complaint  Patient presents with   sleep consult    Referring provider: Wilder Glade*  HPI: 21 year old female seen for sleep consult in October 12, 2021 for insomnia, restless sleep, daytime sleepiness   TEST/EVENTS :  Cardiac MRI normal September 08, 2021  10/12/2021 Sleep consult  Patient presents for sleep consult.  Kindly referred by cardiology Murray Hodgkins, NP .  Patient is being followed by cardiology for frequent PVCs and Hypertension.  Patient complains of trouble going to sleep and staying asleep.,  Restless leg symptoms, daytime fatigue.  Intermittent snoring.  Always feels tired even if sleep all night long. Rarely naps. Typically goes to bed about 930 to 11 PM.  Can take 2 to 3 hours to go to sleep.  Is up 3-4 times at night.  Gets up at 8 AM.  Does not operate heavy machinery.  Weight is up 15 pounds over the last 2 years.  Current weight is at 264 pounds with a BMI of 43.  Does not take sleep aides. Tried melatonin in past with no perceived benefit . Caffeine intake 1 cup of soda.  Epworth score is 9 out of 24.  Typically gets sleepy if sits down to watch TV or reads.  Also if she is a passenger in a car or in the afternoon hours. No symptoms suspicious for cataplexy or sleep paralysis No history of congestive heart failure or stroke No removable dental work  Medical history significant for hypertension, PVCs, chronic headaches, GERD , IBS   Surgical history sinus surgery 2018 . Tonsillectomy  2005   Social history.  Patient is married.  Has 42 child.-31 month old   Works in Scientist, research (medical).  Patient is active smoker.  Social alcohol.  No drug use.  Family history is positive for allergies, asthma, heart disease, cancer   No Known Allergies  Immunization History  Administered Date(s) Administered   DTaP 06/30/2000, 08/17/2000, 10/24/2000, 05/04/2001, 08/27/2004   HPV  Quadrivalent 12/26/2012, 02/26/2013, 07/15/2013   Hepatitis A, Ped/Adol-2 Dose 07/28/2010, 12/07/2011   IPV 06/30/2000, 08/17/2000, 01/18/2001, 08/27/2004   Influenza Nasal 03/07/2011   Influenza-Unspecified 12/26/2012   MMR 05/04/2001, 08/27/2004   Meningococcal Conjugate 05/11/2017   Meningococcal Mcv4o 12/26/2012   Pneumococcal-Unspecified 06/30/2000, 08/17/2000, 10/24/2000, 05/04/2001   Tdap 07/28/2010, 05/28/2020   Varicella 09/06/2001, 12/13/2005    Past Medical History:  Diagnosis Date   Anxiety    Atypical chest pain    Depression    Gestational diabetes    taking glyburide   Heart murmur    mild mitral regurigation by echo   Hypertension    IBS (irritable bowel syndrome)    Mitral regurgitation    a. 03/2020 Echo: EF 60-65%, no rwma, nl RV fxn. Mild MR.   Morbid obesity ([place])    Orthodontics    wears braces   Palpitations    Pregnancy induced hypertension    PVCs (premature ventricular contractions)    a. 06/2021 Zio: Avg HR 94 (51-152). Isolated PVCs w/ burden of 22%. Triggered events = sinus rhythm w/ PVCs.    Tobacco History: Social History   Tobacco Use  Smoking Status Former   Types: E-cigarettes   Quit date: 11/01/2019   Years since quitting: 1.9  Smokeless Tobacco Never   Counseling given: Not Answered   Outpatient Medications Prior to Visit  Medication Sig Dispense Refill   acetaminophen (TYLENOL) 325  MG tablet Take 325 mg by mouth as needed.     diazepam (VALIUM) 2 MG tablet Take 1 tablet (2 mg total) by mouth as directed. Take one tablet immediately before MRI.  Can be repeated once as needed. 2 tablet 0   doxycycline (VIBRAMYCIN) 100 MG capsule Take 1 capsule (100 mg total) by mouth 2 (two) times daily for 14 days. 28 capsule 0   ibuprofen (ADVIL) 200 MG tablet Take 200 mg by mouth as needed.     levonorgestrel (MIRENA) 20 MCG/DAY IUD 1 each by Intrauterine route once. Inserted 11/06/2020 [name]     metoprolol tartrate (LOPRESSOR) 25 MG tablet Take 1  tablet (25 mg total) by mouth 2 (two) times daily. 180 tablet 1   metroNIDAZOLE (FLAGYL) 500 MG tablet Take 1 tablet (500 mg total) by mouth 2 (two) times daily for 7 days. 14 tablet 0   No facility-administered medications prior to visit.     Review of Systems:   Constitutional:   No  weight loss, night sweats,  Fevers, chills,  +fatigue, or  lassitude.  HEENT:   No headaches,  Difficulty swallowing,  Tooth/dental problems, or  Sore throat,                No sneezing, itching, ear ache, nasal congestion, post nasal drip,   CV:  No chest pain,  Orthopnea, PND, swelling in lower extremities, anasarca, dizziness, palpitations, syncope.   GI  No heartburn, indigestion, abdominal pain, nausea, vomiting, diarrhea, change in bowel habits, loss of appetite, bloody stools.   Resp: No shortness of breath with exertion or at rest.  No excess mucus, no productive cough,  No non-productive cough,  No coughing up of blood.  No change in color of mucus.  No wheezing.  No chest wall deformity  Skin: no rash or lesions.  GU: no dysuria, change in color of urine, no urgency or frequency.  No flank pain, no hematuria   MS:  No joint pain or swelling.  No decreased range of motion.  No back pain.    Physical Exam  BP 132/78 (BP Location: Left Arm, Cuff Size: Large)   Pulse 90   Temp 98 F (36.7 C) (Temporal)   Ht 5' 5.5" (1.664 m)   Wt 264 lb 9.6 oz (120 kg)   SpO2 96%   BMI 43.36 kg/m   GEN: A/Ox3; pleasant , NAD, well nourished    HEENT:  [place]/AT,   NOSE-clear, THROAT-clear, no lesions, no postnasal drip or exudate noted.  Class 3 MP airway   NECK:  Supple w/ fair ROM; no JVD; normal carotid impulses w/o bruits; no thyromegaly or nodules palpated; no lymphadenopathy.    RESP  Clear  P & A; w/o, wheezes/ rales/ or rhonchi. no accessory muscle use, no dullness to percussion  CARD:  RRR, no m/r/g, no peripheral edema, pulses intact, no cyanosis or clubbing.  GI:   Soft & nt; nml bowel  sounds; no organomegaly or masses detected.   Musco: Warm bil, no deformities or joint swelling noted.   Neuro: alert, no focal deficits noted.    Skin: Warm, no lesions or rashes    Lab Results:  CBC   BNP No results found for: "BNP"  ProBNP No results found for: "PROBNP"        No data to display          No results found for: "NITRICOXIDE"      Assessment & Plan:   Daytime  sleepiness Daytime sleepiness, snoring , BMI 43, restless sleep , insomnia , pvcs, HTN , headaches . Suspicious for OSA   - discussed how weight can impact sleep and risk for sleep disordered breathing - discussed options to assist with weight loss: combination of diet modification, cardiovascular and strength training exercises   - had an extensive discussion regarding the adverse health consequences related to untreated sleep disordered breathing - specifically discussed the risks for hypertension, coronary artery disease, cardiac dysrhythmias, cerebrovascular disease, and diabetes - lifestyle modification discussed   - discussed how sleep disruption can increase risk of accidents, particularly when driving - safe driving practices were discussed   Plan  Patient Instructions  Set up home sleep study.  Work on healthy sleep regimen  Do not drive if sleepy  Work on healthy weight  Follow up in 6 weeks to discuss results and treatment plan.       Obesity (BMI 30-39.9) Healthy weight loss      Rexene Edison, NP 10/12/2021

## 2021-10-12 NOTE — Telephone Encounter (Signed)
Spoke with pt at the front desk. Pt just left pulmonology appointment and concerns with low HR.  Pt's initial HR was 45-50 then states per pulse ox dropped down "into the 30s and it has not been this low before. Pt denies SOB, dizziness, chest pain. Does have migraine at this time, otherwise feels stable.   Advised pt HR may read lower if having PVCs per pulse ox reading.  Pt is scheduled to see EP, Dr. Lalla Brothers next week 7/26 for management of PVCs.   Offered to take pt to the ER now if she felt she needed to go. Pt denied need to go to ER now.   Advised pt to continue to monitor HR and symptoms. If worsening s/s, develop SOB, chest pain, dizziness or feels unstable to call 911. Pt voiced understanding.   Pt appreciative and has no further questions at this time. Pt will follow up as scheduled next week.

## 2021-10-12 NOTE — Assessment & Plan Note (Signed)
Healthy weight loss 

## 2021-10-12 NOTE — Telephone Encounter (Signed)
STAT if HR is under 50 or over 120 (normal HR is 60-100 beats per minute)  What is your heart rate? 45  Do you have a log of your heart rate readings (document readings)? 33 to 90 - back down to the 30s  Do you have any other symptoms? Massive migraine

## 2021-10-12 NOTE — Progress Notes (Signed)
Reviewed and agree with assessment/plan.   Coralyn Helling, MD [facility] Pulmonary/Critical Care 10/12/2021, 4:10 PM Pager:  [phone]

## 2021-10-20 ENCOUNTER — Ambulatory Visit (INDEPENDENT_AMBULATORY_CARE_PROVIDER_SITE_OTHER): Payer: Medicaid Other | Admitting: Cardiology

## 2021-10-20 ENCOUNTER — Encounter: Payer: Self-pay | Admitting: Cardiology

## 2021-10-20 VITALS — BP 130/90 | HR 105 | Ht 65.0 in | Wt 265.0 lb

## 2021-10-20 DIAGNOSIS — I493 Ventricular premature depolarization: Secondary | ICD-10-CM | POA: Diagnosis not present

## 2021-10-20 MED ORDER — DILTIAZEM HCL ER COATED BEADS 120 MG PO CP24: 120.0000 mg | ORAL_CAPSULE | Freq: Every day | ORAL | 3 refills | Status: DC

## 2021-10-20 NOTE — Progress Notes (Signed)
Electrophysiology Office Note:    Date:  10/20/2021   ID:  Stacy Soto, DOB 2001-02-28, MRN [id number]  PCP:  [facility]  [facility] Cardiologist:  Yvonne Kendall, MD  [facility] Electrophysiologist:  Lanier Prude, MD   Referring MD: Waynette Buttery*   Chief Complaint: PVCs  History of Present Illness:    Stacy Soto is a 21 y.o. female who presents for an evaluation of PVCs at the request of Christain Sacramento, NP. Their medical history includes anxiety and depression, gestational diabetes, hypertension, irritable bowel syndrome, morbid obesity and PVCs.  The patient tells me that she can feel her PVCs.  She has had PVCs since she was in sixth or seventh grade when she had a diagnosis of hypertension.  She has a strong family history of heart disease including in her dad who died at the age of 38 of a heart attack.  She is concerned that these PVCs may predict a worse prognosis for her.    Past Medical History:  Diagnosis Date   Anxiety    Atypical chest pain    Depression    Gestational diabetes    taking glyburide   Heart murmur    mild mitral regurigation by echo   Hypertension    IBS (irritable bowel syndrome)    Mitral regurgitation    a. 03/2020 Echo: EF 60-65%, no rwma, nl RV fxn. Mild MR.   Morbid obesity ([facility])    Orthodontics    wears braces   Palpitations    Pregnancy induced hypertension    PVCs (premature ventricular contractions)    a. 06/2021 Zio: Avg HR 94 (51-152). Isolated PVCs w/ burden of 22%. Triggered events = sinus rhythm w/ PVCs.    Past Surgical History:  Procedure Laterality Date   ADENOIDECTOMY  2005   NASAL TURBINATE REDUCTION Left 06/28/2016   Procedure: TURBINATE REDUCTION/SUBMUCOSAL RESECTION;  Surgeon: Geanie Logan, MD;  Location: [facility];  Service: ENT;  Laterality: Left;   SEPTOPLASTY N/A 06/28/2016   Procedure: SEPTOPLASTY;  Surgeon: Geanie Logan, MD;  Location: [facility];  Service:  ENT;  Laterality: N/A;   TONSILLECTOMY  2005   TONSILLECTOMY      Current Medications: Current Meds  Medication Sig   acetaminophen (TYLENOL) 325 MG tablet Take 325 mg by mouth as needed.   diazepam (VALIUM) 2 MG tablet Take 1 tablet (2 mg total) by mouth as directed. Take one tablet immediately before MRI.  Can be repeated once as needed.   diltiazem (CARDIZEM CD) 120 MG 24 hr capsule Take 1 capsule (120 mg total) by mouth daily.   doxycycline (VIBRAMYCIN) 100 MG capsule Take 1 capsule (100 mg total) by mouth 2 (two) times daily for 14 days.   ibuprofen (ADVIL) 200 MG tablet Take 200 mg by mouth as needed.   levonorgestrel (MIRENA) 20 MCG/DAY IUD 1 each by Intrauterine route once. Inserted 11/06/2020 [name]   metoprolol tartrate (LOPRESSOR) 25 MG tablet Take 1 tablet (25 mg total) by mouth 2 (two) times daily.     Allergies:   Patient has no known allergies.   Social History   Socioeconomic History   Marital status: Significant Other    Spouse name: Doree Fudge   Number of children: Not on file   Years of education: Not on file   Highest education level: Not on file  Occupational History   Occupation: Telecommunicater  Tobacco Use   Smoking status: Former    Types:  E-cigarettes    Quit date: 11/01/2019    Years since quitting: 1.9   Smokeless tobacco: Never  Vaping Use   Vaping Use: Never used  Substance and Sexual Activity   Alcohol use: Yes    Comment: occassional glass of wine   Drug use: Never   Sexual activity: Yes    Birth control/protection: None, I.U.D.  Other Topics Concern   Not on file  Social History Narrative   Not on file   Social Determinants of Health   Financial Resource Strain: Not on file  Food Insecurity: Not on file  Transportation Needs: Not on file  Physical Activity: Not on file  Stress: Not on file  Social Connections: Not on file     Family History: The patient's family history includes Breast cancer in her paternal grandmother; Diabetes in her  father and paternal grandfather; Other in her mother; Polycystic ovary syndrome in her mother.  ROS:   Please see the history of present illness.    All other systems reviewed and are negative.  EKGs/Labs/Other Studies Reviewed:    The following studies were reviewed today:  July 07, 2021 ZIO monitor 22% PVC burden  September 09, 2021 cardiac MRI Normal left ventricular size and function No LGE  April 11, 2020 echo Left ventricular function normal, 60 to 65% Right ventricular function normal Mild MR  July 07, 2021 EKG   EKG:  The ekg ordered today demonstrates sinus rhythm with frequent monomorphic PVCs.  Sinus tachycardia.   Recent Labs: 04/28/2021: Magnesium 2.0; TSH 1.900 10/06/2021: ALT 25; BUN 11; Creatinine, Ser 0.61; Hemoglobin 14.6; Platelets 293; Potassium 4.0; Sodium 138  Recent Lipid Panel    Component Value Date/Time   CHOL 164 01/22/2013 1450   TRIG 231 (H) 01/22/2013 1450   HDL 54 01/22/2013 1450   VLDL 46 (H) 01/22/2013 1450   LDLCALC 64 01/22/2013 1450    Physical Exam:    VS:  BP 130/90   Pulse (!) 105   Ht 5\' 5"  (1.651 m)   Wt 265 lb (120.2 kg)   BMI 44.10 kg/m     Wt Readings from Last 3 Encounters:  10/20/21 265 lb (120.2 kg)  10/12/21 264 lb 9.6 oz (120 kg)  08/18/21 265 lb (120.2 kg)     GEN: Well nourished, well developed in no acute distress.  Obese HEENT: Normal NECK: No JVD; No carotid bruits LYMPHATICS: No lymphadenopathy CARDIAC: Irregular rhythm, no murmurs, rubs, gallops RESPIRATORY:  Clear to auscultation without rales, wheezing or rhonchi  ABDOMEN: Soft, non-tender, non-distended MUSCULOSKELETAL:  No edema; No deformity  SKIN: Warm and dry NEUROLOGIC:  Alert and oriented x 3 PSYCHIATRIC:  Normal affect       ASSESSMENT:    1. PVC's (premature ventricular contractions)   2. Morbid obesity (HCC)    PLAN:    In order of problems listed above:  #Frequent PVCs Unclear cause.  Appear to be arising from the [place]  network given the sharp downslope.  I suspect somewhere on the RV side of the septum.  She has tried beta-blockers without much impact.  I will have her stop the metoprolol and start Cardizem 120 mg by mouth once daily.  I will have her follow-up with 08/20/21 in clinic in 6 to 8 weeks.    If she has had little to no improvement, it would be reasonable to start flecainide 50 mg by mouth twice daily.  If flecainide is started, she will need a treadmill ECG  test 7 to 10 days after her first dose and follow-up with me 6 to 8 weeks after that.  Follow-up 6 to 8 weeks with APP.   Total time spent with patient today 45 minutes. This includes reviewing records, evaluating the patient and coordinating care.  Medication Adjustments/Labs and Tests Ordered: Current medicines are reviewed at length with the patient today.  Concerns regarding medicines are outlined above.  Orders Placed This Encounter  Procedures   EKG 12-Lead   Meds ordered this encounter  Medications   diltiazem (CARDIZEM CD) 120 MG 24 hr capsule    Sig: Take 1 capsule (120 mg total) by mouth daily.    Dispense:  90 capsule    Refill:  3     Signed, Maritza Goldsborough T. Lalla Brothers, MD, [facility], [facility] 10/20/2021 2:48 PM    Electrophysiology [facility]

## 2021-10-20 NOTE — Patient Instructions (Addendum)
Medications: Stop Metoprolol tartrate Start Diltiazem 120 mg daily Your physician recommends that you continue on your current medications as directed. Please refer to the Current Medication list given to you today. *If you need a refill on your cardiac medications before your next appointment, please call your pharmacy*  Lab Work: None. If you have labs (blood work) drawn today and your tests are completely normal, you will receive your results only by: MyChart Message (if you have MyChart) OR A paper copy in the mail If you have any lab test that is abnormal or we need to change your treatment, we will call you to review the results.  Testing/Procedures: None.  Follow-Up: At [facility], you and your health needs are our priority.  As part of our continuing mission to provide you with exceptional heart care, we have created designated Provider Care Teams.  These Care Teams include your primary Cardiologist (physician) and Advanced Practice Providers (APPs -  Physician Assistants and Nurse Practitioners) who all work together to provide you with the care you need, when you need it.  Your physician wants you to follow-up in: 6-8 weeks with one of the following Advanced Practice Providers on your designated Care Team:    Ward Givens, NP   We recommend signing up for the patient portal called "MyChart".  Sign up information is provided on this After Visit Summary.  MyChart is used to connect with patients for Virtual Visits (Telemedicine).  Patients are able to view lab/test results, encounter notes, upcoming appointments, etc.  Non-urgent messages can be sent to your provider as well.   To learn more about what you can do with MyChart, go to [email or web address].    Any Other Special Instructions Will Be Listed Below (If Applicable).

## 2021-10-21 ENCOUNTER — Encounter: Payer: Medicaid Other | Admitting: Obstetrics and Gynecology

## 2021-11-22 ENCOUNTER — Ambulatory Visit: Payer: Medicaid Other

## 2021-11-22 DIAGNOSIS — G4733 Obstructive sleep apnea (adult) (pediatric): Secondary | ICD-10-CM | POA: Diagnosis not present

## 2021-11-22 DIAGNOSIS — G4719 Other hypersomnia: Secondary | ICD-10-CM

## 2021-11-24 DIAGNOSIS — G4733 Obstructive sleep apnea (adult) (pediatric): Secondary | ICD-10-CM | POA: Diagnosis not present

## 2021-11-25 ENCOUNTER — Telehealth: Payer: Self-pay | Admitting: Adult Health

## 2021-11-25 NOTE — Telephone Encounter (Signed)
Home sleep study done on November 22, 2021 shows mild sleep apnea with a AHI at 6.9/hour and SPO2 low at 89%.  Please set up in person or virtual visit to discuss sleep study results and treatment plan

## 2021-11-25 NOTE — Telephone Encounter (Signed)
-----   Message from Stacy Soto sent at 11/24/2021  4:22 PM EDT ----- Hello  this patient's HST report is ready for review

## 2021-11-26 NOTE — Telephone Encounter (Signed)
ATC x1.  LVM to return call on 11/30/21.

## 2021-11-30 ENCOUNTER — Encounter: Payer: Self-pay | Admitting: Adult Health

## 2021-11-30 ENCOUNTER — Ambulatory Visit (INDEPENDENT_AMBULATORY_CARE_PROVIDER_SITE_OTHER): Payer: Medicaid Other | Admitting: Adult Health

## 2021-11-30 DIAGNOSIS — G4733 Obstructive sleep apnea (adult) (pediatric): Secondary | ICD-10-CM | POA: Diagnosis not present

## 2021-11-30 DIAGNOSIS — Z6841 Body Mass Index (BMI) 40.0 and over, adult: Secondary | ICD-10-CM

## 2021-11-30 NOTE — Progress Notes (Signed)
Reviewed and agree with assessment/plan.   Coralyn Helling, MD [facility] Pulmonary/Critical Care 11/30/2021, 4:55 PM Pager:  [phone]

## 2021-11-30 NOTE — Assessment & Plan Note (Signed)
Healthy weight loss 

## 2021-11-30 NOTE — Assessment & Plan Note (Signed)
Mild obstructive sleep apnea-we discussed her test results went over treatment options.  We discussed healthy weight loss.  Patient would like to proceed with oral appliance.  Will refer to [facility]. - discussed how weight can impact sleep and risk for sleep disordered breathing - discussed options to assist with weight loss: combination of diet modification, cardiovascular and strength training exercises   - had an extensive discussion regarding the adverse health consequences related to untreated sleep disordered breathing - specifically discussed the risks for hypertension, coronary artery disease, cardiac dysrhythmias, cerebrovascular disease, and diabetes - lifestyle modification discussed   - discussed how sleep disruption can increase risk of accidents, particularly when driving - safe driving practices were discussed   Plan  Patient Instructions  Refer for oral appliance to [facility] [phone])  Work on healthy sleep regimen  Do not drive if sleepy  Work on healthy weight  Follow up in 6 months and As needed

## 2021-11-30 NOTE — Patient Instructions (Addendum)
Refer for oral appliance to [facility] [phone])  Work on healthy sleep regimen  Do not drive if sleepy  Work on healthy weight  Follow up in 6 months and As needed

## 2021-11-30 NOTE — Addendum Note (Signed)
Addended by: Delrae Rend on: 11/30/2021 04:57 PM   Modules accepted: Orders

## 2021-11-30 NOTE — Telephone Encounter (Signed)
Called and spoke with patient, advised of results/recommendations per Rubye Oaks NP.  She verbalized understanding.  Scheduled to see Tammy today at 4:30 pm.  She was advised to arrive by 4:15 pm for check in.  Nothing further needed.

## 2021-11-30 NOTE — Progress Notes (Signed)
'@Patient'  ID: Stacy Soto, female    DOB: 01-03-2001, 21 y.o.   MRN: [id number]  Chief Complaint  Patient presents with   Follow-up    Referring provider: [facility]  HPI: 21 year old female seen for sleep consult October 12, 2021 for insomnia, restless sleep and daytime sleepiness found to have mild sleep apnea  TEST/EVENTS :  Home sleep study done on November 22, 2021 shows mild sleep apnea with a AHI at 6.9/hour and SPO2 low at 89%.  11/30/2021 Follow up : OSA  Patient returns for a 6-week follow-up.  Patient was seen last visit for sleep consult.  Patient was complaining of insomnia, restless sleep and daytime sleepiness.  She was set up for home sleep study that was done on November 22, 2021 that showed mild sleep apnea with AHI at 6.9/hour and SPO2 low at 89%.  We discussed her sleep study results went over treatment options including weight loss, oral appliance and CPAP.  Patient like to proceed with Oral appliance.     No Known Allergies  Immunization History  Administered Date(s) Administered   DTaP 06/30/2000, 08/17/2000, 10/24/2000, 05/04/2001, 08/27/2004   HPV Quadrivalent 12/26/2012, 02/26/2013, 07/15/2013   Hepatitis A, Ped/Adol-2 Dose 07/28/2010, 12/07/2011   IPV 06/30/2000, 08/17/2000, 01/18/2001, 08/27/2004   Influenza Nasal 03/07/2011   Influenza-Unspecified 12/26/2012   MMR 05/04/2001, 08/27/2004   Meningococcal Conjugate 05/11/2017   Meningococcal Mcv4o 12/26/2012   Pneumococcal-Unspecified 06/30/2000, 08/17/2000, 10/24/2000, 05/04/2001   Tdap 07/28/2010, 05/28/2020   Varicella 09/06/2001, 12/13/2005    Past Medical History:  Diagnosis Date   Anxiety    Atypical chest pain    Depression    Gestational diabetes    taking glyburide   Heart murmur    mild mitral regurigation by echo   Hypertension    IBS (irritable bowel syndrome)    Mitral regurgitation    a. 03/2020 Echo: EF 60-65%, no rwma, nl RV fxn. Mild MR.   Morbid obesity ([place])     Orthodontics    wears braces   Palpitations    Pregnancy induced hypertension    PVCs (premature ventricular contractions)    a. 06/2021 Zio: Avg HR 94 (51-152). Isolated PVCs w/ burden of 22%. Triggered events = sinus rhythm w/ PVCs.    Tobacco History: Social History   Tobacco Use  Smoking Status Former   Types: E-cigarettes   Quit date: 11/01/2019   Years since quitting: 2.0  Smokeless Tobacco Never   Counseling given: Not Answered   Outpatient Medications Prior to Visit  Medication Sig Dispense Refill   acetaminophen (TYLENOL) 325 MG tablet Take 325 mg by mouth as needed.     diazepam (VALIUM) 2 MG tablet Take 1 tablet (2 mg total) by mouth as directed. Take one tablet immediately before MRI.  Can be repeated once as needed. 2 tablet 0   diltiazem (CARDIZEM CD) 120 MG 24 hr capsule Take 1 capsule (120 mg total) by mouth daily. 90 capsule 3   ibuprofen (ADVIL) 200 MG tablet Take 200 mg by mouth as needed.     levonorgestrel (MIRENA) 20 MCG/DAY IUD 1 each by Intrauterine route once. Inserted 11/06/2020 [name]     No facility-administered medications prior to visit.     Review of Systems:   Constitutional:   No  weight loss, night sweats,  Fevers, chills, fatigue, or  lassitude.  HEENT:   No headaches,  Difficulty swallowing,  Tooth/dental problems, or  Sore throat,  No sneezing, itching, ear ache, nasal congestion, post nasal drip,   CV:  No chest pain,  Orthopnea, PND, swelling in lower extremities, anasarca, dizziness, palpitations, syncope.   GI  No heartburn, indigestion, abdominal pain, nausea, vomiting, diarrhea, change in bowel habits, loss of appetite, bloody stools.   Resp: No shortness of breath with exertion or at rest.  No excess mucus, no productive cough,  No non-productive cough,  No coughing up of blood.  No change in color of mucus.  No wheezing.  No chest wall deformity  Skin: no rash or lesions.  GU: no dysuria, change in color of urine, no  urgency or frequency.  No flank pain, no hematuria   MS:  No joint pain or swelling.  No decreased range of motion.  No back pain.    Physical Exam  BP (!) 120/90 (BP Location: Right Arm, Patient Position: Sitting, Cuff Size: Large)   Pulse 67   Temp 98.4 F (36.9 C) (Oral)   Ht 5' 5.5" (1.664 m)   Wt 262 lb 12.8 oz (119.2 kg)   SpO2 97%   BMI 43.07 kg/m   GEN: A/Ox3; pleasant , NAD, well nourished    HEENT:  [place]/AT,  NOSE-clear, THROAT-clear, no lesions, no postnasal drip or exudate noted.   NECK:  Supple w/ fair ROM; no JVD; normal carotid impulses w/o bruits; no thyromegaly or nodules palpated; no lymphadenopathy.    RESP  Clear  P & A; w/o, wheezes/ rales/ or rhonchi. no accessory muscle use, no dullness to percussion  CARD:  RRR, no m/r/g, no peripheral edema, pulses intact, no cyanosis or clubbing.  GI:   Soft & nt; nml bowel sounds; no organomegaly or masses detected.   Musco: Warm bil, no deformities or joint swelling noted.   Neuro: alert, no focal deficits noted.    Skin: Warm, no lesions or rashes    Lab Results:     BNP No results found for: "BNP"  ProBNP No results found for: "PROBNP"  Imaging: No results found.        No data to display          No results found for: "NITRICOXIDE"      Assessment & Plan:   OSA (obstructive sleep apnea) Mild obstructive sleep apnea-we discussed her test results went over treatment options.  We discussed healthy weight loss.  Patient would like to proceed with oral appliance.  Will refer to [facility]. - discussed how weight can impact sleep and risk for sleep disordered breathing - discussed options to assist with weight loss: combination of diet modification, cardiovascular and strength training exercises   - had an extensive discussion regarding the adverse health consequences related to untreated sleep disordered breathing - specifically discussed the risks for hypertension,  coronary artery disease, cardiac dysrhythmias, cerebrovascular disease, and diabetes - lifestyle modification discussed   - discussed how sleep disruption can increase risk of accidents, particularly when driving - safe driving practices were discussed   Plan  Patient Instructions  Refer for oral appliance to [facility] [phone])  Work on healthy sleep regimen  Do not drive if sleepy  Work on healthy weight  Follow up in 6 months and As needed       Morbid obesity with BMI of 40.0-44.9, adult ([place]) Healthy weight loss.      Rexene Edison, NP 11/30/2021

## 2021-12-01 ENCOUNTER — Ambulatory Visit: Payer: Medicaid Other | Attending: Nurse Practitioner | Admitting: Nurse Practitioner

## 2021-12-01 ENCOUNTER — Encounter: Payer: Self-pay | Admitting: Nurse Practitioner

## 2021-12-01 NOTE — Progress Notes (Deleted)
Office Visit    Patient Name: Stacy Soto Date of Encounter: 12/01/2021  Primary Care Provider:  [facility] Cardiologist:  Yvonne Kendall, MD  Chief Complaint    21 year old female with a history of HTN, palpitations, frequent PVCs, anxiety, depression, and atypical chest pain here for a follow-up visit related to frequent PVCs and initiation of Cardizem by electrophysiology.   Past Medical History    Past Medical History:  Diagnosis Date   Anxiety    Atypical chest pain    Depression    Gestational diabetes    taking glyburide   Heart murmur    mild mitral regurigation by echo   Hypertension    IBS (irritable bowel syndrome)    Mitral regurgitation    a. 03/2020 Echo: EF 60-65%, no rwma, nl RV fxn. Mild MR.   Morbid obesity ([facility])    Orthodontics    wears braces   Palpitations    Pregnancy induced hypertension    PVCs (premature ventricular contractions)    a. 06/2021 Zio: Avg HR 94 (51-152). Isolated PVCs w/ burden of 22%. Triggered events = sinus rhythm w/ PVCs.   Past Surgical History:  Procedure Laterality Date   ADENOIDECTOMY  2005   NASAL TURBINATE REDUCTION Left 06/28/2016   Procedure: TURBINATE REDUCTION/SUBMUCOSAL RESECTION;  Surgeon: Geanie Logan, MD;  Location: [facility];  Service: ENT;  Laterality: Left;   SEPTOPLASTY N/A 06/28/2016   Procedure: SEPTOPLASTY;  Surgeon: Geanie Logan, MD;  Location: [facility];  Service: ENT;  Laterality: N/A;   TONSILLECTOMY  2005   TONSILLECTOMY      Allergies  No Known Allergies  History of Present Illness    21 year old female with past medical history including hypertension, palpitations, frequent PVCs, anxiety, depression, and atypical chest pain.  She was diagnosed with hypertension and a heart murmur in middle school and has been followed with serial echocardiograms.  She established care with our office in December 2021 in the setting of pregnancy.  Echocardiogram in  January 2022 showed an EF of 60 to 65% without regional wall motion abnormalities, and mild mitral regurgitation. In April 2023, she was seen in clinic and noted to have frequent PVCs along with complaints of palpitations, occasional lightheadedness with fleeting sharp chest pain at rest.  A Zio monitor was placed to better characterize her PVC burden and this showed isolated PVCs with a burden of 22%.  Triggered events equated to sinus rhythm with PVCs.  No sustained arrhythmia or pauses were noted. Cardiac MRI in June 2023 showed LVEF 51% and no LGE. Patient seen by Dr. Lalla Brothers with electrophysiology on 7/26 and started on Cardizem 120 mg daily d/t unclear cause of PVCs.     Home Medications    Current Outpatient Medications  Medication Sig Dispense Refill   acetaminophen (TYLENOL) 325 MG tablet Take 325 mg by mouth as needed.     diazepam (VALIUM) 2 MG tablet Take 1 tablet (2 mg total) by mouth as directed. Take one tablet immediately before MRI.  Can be repeated once as needed. 2 tablet 0   diltiazem (CARDIZEM CD) 120 MG 24 hr capsule Take 1 capsule (120 mg total) by mouth daily. 90 capsule 3   ibuprofen (ADVIL) 200 MG tablet Take 200 mg by mouth as needed.     levonorgestrel (MIRENA) 20 MCG/DAY IUD 1 each by Intrauterine route once. Inserted 11/06/2020 [name]     No current facility-administered medications for this visit.  Review of Systems    ***.  All other systems reviewed and are otherwise negative except as noted above.    Physical Exam    VS:  There were no vitals taken for this visit. , BMI There is no height or weight on file to calculate BMI. STOP-Bang Score:  5  { Consider Dx Sleep Disordered Breathing or Sleep Apnea  ICD G47.33          :1}    GEN: Well nourished, well developed, in no acute distress. HEENT: normal. Neck: Supple, no JVD, carotid bruits, or masses. Cardiac: RRR, no murmurs, rubs, or gallops. No clubbing, cyanosis, edema.  Radials/DP/PT 2+ and equal  bilaterally.  Respiratory:  Respirations regular and unlabored, clear to auscultation bilaterally. GI: Soft, nontender, nondistended, BS + x 4. MS: no deformity or atrophy. Skin: warm and dry, no rash. Neuro:  Strength and sensation are intact. Psych: Normal affect.  Accessory Clinical Findings    ECG personally reviewed by me today - *** - no acute changes.  Lab Results  Component Value Date   WBC 14.5 (H) 10/06/2021   HGB 14.6 10/06/2021   HCT 43.9 10/06/2021   MCV 86.1 10/06/2021   PLT 293 10/06/2021   Lab Results  Component Value Date   CREATININE 0.61 10/06/2021   BUN 11 10/06/2021   NA 138 10/06/2021   K 4.0 10/06/2021   CL 106 10/06/2021   CO2 24 10/06/2021   Lab Results  Component Value Date   ALT 25 10/06/2021   AST 21 10/06/2021   ALKPHOS 47 10/06/2021   BILITOT 0.6 10/06/2021   Lab Results  Component Value Date   CHOL 164 01/22/2013   HDL 54 01/22/2013   LDLCALC 64 01/22/2013   TRIG 231 (H) 01/22/2013    Lab Results  Component Value Date   HGBA1C 5.9 (H) 01/01/2020    Assessment & Plan    1.  ***   Nicolasa Ducking, NP 12/01/2021, 12:14 PM

## 2022-01-05 ENCOUNTER — Encounter: Payer: Self-pay | Admitting: Obstetrics and Gynecology

## 2022-01-18 ENCOUNTER — Ambulatory Visit: Payer: Medicaid Other | Admitting: Adult Health

## 2022-02-14 NOTE — Progress Notes (Unsigned)
GYNECOLOGY ANNUAL PHYSICAL EXAM PROGRESS NOTE  Subjective:    Stacy Soto is a 21 y.o. G74P1001 female who presents for an annual exam. The patient is sexually active. The patient participates in regular exercise: yes. Has the patient ever been transfused or tattooed?: yes. The patient reports that there is not domestic violence in her life.   Menstrual History: Menarche age: 1 No LMP recorded. (Menstrual status: IUD).     Gynecologic History:  Contraception:  Mirena IUD (inserted 11/06/2020) History of STI's: Denies Last Pap: never done Last mammogram: Not age appropriate  OB History  Gravida Para Term Preterm AB Living  1 1 1  0 0 1  SAB IAB Ectopic Multiple Live Births  0 0 0 0 1    # Outcome Date GA Lbr Len/2nd Weight Sex Delivery Anes PTL Lv  1 Term 07/27/20 [redacted]w[redacted]d 12:40 / 01:20 6 lb 1.7 oz (2.77 kg) F Vag-Spont EPI  LIV     Name: Stacy Soto     Apgar1: 8  Apgar5: 9    Past Medical History:  Diagnosis Date   Anxiety    Atypical chest pain    Depression    Gestational diabetes    taking glyburide   Heart murmur    mild mitral regurigation by echo   Hypertension    IBS (irritable bowel syndrome)    Mitral regurgitation    a. 03/2020 Echo: EF 60-65%, no rwma, nl RV fxn. Mild MR.   Morbid obesity ([facility])    Orthodontics    wears braces   Palpitations    Pregnancy induced hypertension    PVCs (premature ventricular contractions)    a. 06/2021 Zio: Avg HR 94 (51-152). Isolated PVCs w/ burden of 22%. Triggered events = sinus rhythm w/ PVCs.    Past Surgical History:  Procedure Laterality Date   ADENOIDECTOMY  2005   NASAL TURBINATE REDUCTION Left 06/28/2016   Procedure: TURBINATE REDUCTION/SUBMUCOSAL RESECTION;  Surgeon: 08/28/2016, MD;  Location: [facility];  Service: ENT;  Laterality: Left;   SEPTOPLASTY N/A 06/28/2016   Procedure: SEPTOPLASTY;  Surgeon: 08/28/2016, MD;  Location: [facility];  Service: ENT;  Laterality: N/A;    TONSILLECTOMY  2005   TONSILLECTOMY      Family History  Problem Relation Age of Onset   Other Mother        Gastro problems   Polycystic ovary syndrome Mother    Diabetes Father    Breast cancer Paternal Grandmother    Diabetes Paternal Grandfather     Social History   Socioeconomic History   Marital status: Significant Other    Spouse name: 2006   Number of children: Not on file   Years of education: Not on file   Highest education level: Not on file  Occupational History   Occupation: Telecommunicater  Tobacco Use   Smoking status: Former    Types: E-cigarettes    Quit date: 11/01/2019    Years since quitting: 2.2   Smokeless tobacco: Never  Vaping Use   Vaping Use: Never used  Substance and Sexual Activity   Alcohol use: Yes    Comment: occassional glass of wine   Drug use: Never   Sexual activity: Yes    Birth control/protection: None, I.U.D.  Other Topics Concern   Not on file  Social History Narrative   Not on file   Social Determinants of Health   Financial Resource Strain: Not on file  Food Insecurity: Not on  file  Transportation Needs: Not on file  Physical Activity: Not on file  Stress: Not on file  Social Connections: Not on file  Intimate Partner Violence: Not on file    Current Outpatient Medications on File Prior to Visit  Medication Sig Dispense Refill   acetaminophen (TYLENOL) 325 MG tablet Take 325 mg by mouth as needed.     diazepam (VALIUM) 2 MG tablet Take 1 tablet (2 mg total) by mouth as directed. Take one tablet immediately before MRI.  Can be repeated once as needed. 2 tablet 0   diltiazem (CARDIZEM CD) 120 MG 24 hr capsule Take 1 capsule (120 mg total) by mouth daily. 90 capsule 3   ibuprofen (ADVIL) 200 MG tablet Take 200 mg by mouth as needed.     levonorgestrel (MIRENA) 20 MCG/DAY IUD 1 each by Intrauterine route once. Inserted 11/06/2020 [name]     No current facility-administered medications on file prior to visit.    No Known  Allergies   Review of Systems Constitutional: negative for chills, fatigue, fevers and sweats Eyes: negative for irritation, redness and visual disturbance Ears, nose, mouth, throat, and face: negative for hearing loss, nasal congestion, snoring and tinnitus Respiratory: negative for asthma, cough, sputum Cardiovascular: negative for chest pain, dyspnea, exertional chest pressure/discomfort, irregular heart beat, palpitations and syncope Gastrointestinal: negative for abdominal pain, change in bowel habits, nausea and vomiting Genitourinary: negative for abnormal menstrual periods, genital lesions, sexual problems and vaginal discharge, dysuria and urinary incontinence Integument/breast: negative for breast lump, breast tenderness and nipple discharge Hematologic/lymphatic: negative for bleeding and easy bruising Musculoskeletal:negative for back pain and muscle weakness Neurological: negative for dizziness, headaches, vertigo and weakness Endocrine: negative for diabetic symptoms including polydipsia, polyuria and skin dryness Allergic/Immunologic: negative for hay fever and urticaria      Objective:  not currently breastfeeding. There is no height or weight on file to calculate BMI.    General Appearance:    Alert, cooperative, no distress, appears stated age  Head:    Normocephalic, without obvious abnormality, atraumatic  Eyes:    PERRL, conjunctiva/corneas clear, EOM's intact, both eyes  Ears:    Normal external ear canals, both ears  Nose:   Nares normal, septum midline, mucosa normal, no drainage or sinus tenderness  Throat:   Lips, mucosa, and tongue normal; teeth and gums normal  Neck:   Supple, symmetrical, trachea midline, no adenopathy; thyroid: no enlargement/tenderness/nodules; no carotid bruit or JVD  Back:     Symmetric, no curvature, ROM normal, no CVA tenderness  Lungs:     Clear to auscultation bilaterally, respirations unlabored  Chest Wall:    No tenderness or  deformity   Heart:    Regular rate and rhythm, S1 and S2 normal, no murmur, rub or gallop  Breast Exam:    No tenderness, masses, or nipple abnormality  Abdomen:     Soft, non-tender, bowel sounds active all four quadrants, no masses, no organomegaly.    Genitalia:    Pelvic:external genitalia normal, vagina without lesions, discharge, or tenderness, rectovaginal septum  normal. Cervix normal in appearance, no cervical motion tenderness, no adnexal masses or tenderness.  Uterus normal size, shape, mobile, regular contours, nontender.  Rectal:    Normal external sphincter.  No hemorrhoids appreciated. Internal exam not done.   Extremities:   Extremities normal, atraumatic, no cyanosis or edema  Pulses:   2+ and symmetric all extremities  Skin:   Skin color, texture, turgor normal, no rashes or lesions  Lymph nodes:   Cervical, supraclavicular, and axillary nodes normal  Neurologic:   CNII-XII intact, normal strength, sensation and reflexes throughout   .  Labs:  Lab Results  Component Value Date   WBC 14.5 (H) 10/06/2021   HGB 14.6 10/06/2021   HCT 43.9 10/06/2021   MCV 86.1 10/06/2021   PLT 293 10/06/2021    Lab Results  Component Value Date   CREATININE 0.61 10/06/2021   BUN 11 10/06/2021   NA 138 10/06/2021   K 4.0 10/06/2021   CL 106 10/06/2021   CO2 24 10/06/2021    Lab Results  Component Value Date   ALT 25 10/06/2021   AST 21 10/06/2021   ALKPHOS 47 10/06/2021   BILITOT 0.6 10/06/2021    Lab Results  Component Value Date   TSH 1.900 04/28/2021     Assessment:   No diagnosis found.   Plan:  Blood tests: {blood tests:13147}. Breast self exam technique reviewed and patient encouraged to perform self-exam monthly. Contraception: IUD. Discussed healthy lifestyle modifications. Mammogram  : Not age appropriate Pap smear ordered. COVID vaccination status: Follow up in 1 year for annual exam   Hildred Laser, MD [facility]

## 2022-02-15 ENCOUNTER — Ambulatory Visit (INDEPENDENT_AMBULATORY_CARE_PROVIDER_SITE_OTHER): Payer: Medicaid Other | Admitting: Obstetrics and Gynecology

## 2022-02-15 ENCOUNTER — Encounter: Payer: Self-pay | Admitting: Obstetrics and Gynecology

## 2022-02-15 VITALS — BP 126/84 | HR 91 | Resp 16 | Ht 65.0 in | Wt 264.0 lb

## 2022-02-15 DIAGNOSIS — Z01419 Encounter for gynecological examination (general) (routine) without abnormal findings: Secondary | ICD-10-CM | POA: Diagnosis not present

## 2022-02-15 DIAGNOSIS — N921 Excessive and frequent menstruation with irregular cycle: Secondary | ICD-10-CM

## 2022-02-15 DIAGNOSIS — Z30432 Encounter for removal of intrauterine contraceptive device: Secondary | ICD-10-CM | POA: Diagnosis not present

## 2022-02-15 DIAGNOSIS — Z124 Encounter for screening for malignant neoplasm of cervix: Secondary | ICD-10-CM

## 2022-02-15 DIAGNOSIS — Z6841 Body Mass Index (BMI) 40.0 and over, adult: Secondary | ICD-10-CM | POA: Diagnosis not present

## 2022-02-15 DIAGNOSIS — R7303 Prediabetes: Secondary | ICD-10-CM

## 2022-03-04 DIAGNOSIS — R7303 Prediabetes: Secondary | ICD-10-CM | POA: Insufficient documentation

## 2022-03-04 HISTORY — DX: Prediabetes: R73.03

## 2022-03-28 NOTE — L&D Delivery Note (Signed)
Delivery Note  Date of delivery: 12/03/2022 Estimated Date of Delivery: 01/02/23 Patient's last menstrual period was 02/18/2022. EGA: [redacted]w[redacted]d  Delivery Note At 6:56 AM a viable female was delivered via Vaginal, Spontaneous (Presentation: Left Occiput Anterior).  APGAR: 9, 9; weight 8 lb 3.2 oz (3720 g).  Placenta status: Spontaneous, Intact.  Cord: 3 vessels with the following complications: None.    First Stage: Labor onset: unknown Induction: Cytotec, Pitocin, AROM Analgesia /Anesthesia intrapartum: Epidural AROM at [facility] presented to L&D with CHTN with superimposed pre-e and diet controlled GDM. She was induced with cytotec, pitocin and AROM. Epidural placed for pain relief.   Second Stage: Complete dilation at 1846 Onset of pushing at 1856 FHR second stage Cat I Delivery at 0656 on 12/03/2022  She progressed to complete and had a spontaneous vaginal birth of a live female over an intact perineum. The fetal head was delivered in OA position with restitution to LOA. Loose nuchal cord. Anterior then posterior shoulders delivered spontaneously with minimal assistance. Baby placed on mom's abdomen and attended to by transition RN. Cord clamped and cut after 1+ min by FOB.   Third Stage: Placenta delivered intact with 3VC at 1905 Placenta disposition: routine disposal Uterine tone firm / bleeding min IV pitocin given for hemorrhage prophylaxis  Anesthesia: Epidural Episiotomy: None Lacerations:  none Suture Repair: n/a Est. Blood Loss (mL): 200  Complications: none  Mom to postpartum.  Baby to Couplet care / Skin to Skin.  Newborn: Birth Weight: pending  Apgar Scores: 9, 9 Feeding planned: breastfeeding   Cyril Mourning, CNM 12/03/2022 7:17 PM

## 2022-03-29 ENCOUNTER — Encounter: Payer: Medicaid Other | Admitting: Certified Nurse Midwife

## 2022-03-29 ENCOUNTER — Other Ambulatory Visit: Payer: Medicaid Other

## 2022-04-25 ENCOUNTER — Other Ambulatory Visit: Payer: Self-pay

## 2022-04-25 ENCOUNTER — Ambulatory Visit (INDEPENDENT_AMBULATORY_CARE_PROVIDER_SITE_OTHER): Payer: Medicaid Other

## 2022-04-25 VITALS — BP 135/72 | HR 107 | Resp 16 | Ht 65.0 in | Wt 264.8 lb

## 2022-04-25 DIAGNOSIS — O219 Vomiting of pregnancy, unspecified: Secondary | ICD-10-CM

## 2022-04-25 DIAGNOSIS — Z789 Other specified health status: Secondary | ICD-10-CM

## 2022-04-25 DIAGNOSIS — N912 Amenorrhea, unspecified: Secondary | ICD-10-CM

## 2022-04-25 DIAGNOSIS — Z3201 Encounter for pregnancy test, result positive: Secondary | ICD-10-CM

## 2022-04-25 LAB — POCT URINE PREGNANCY: Preg Test, Ur: POSITIVE — AB

## 2022-04-25 MED ORDER — ONDANSETRON HCL 4 MG PO TABS: 4.0000 mg | ORAL_TABLET | Freq: Three times a day (TID) | ORAL | 3 refills | Status: DC | PRN

## 2022-04-25 NOTE — Patient Instructions (Incomplete)
Common Medications Safe in Pregnancy  Acne:      Constipation:  Benzoyl Peroxide     Colace  Clindamycin      Dulcolax Suppository  Topica Erythromycin     Fibercon  Salicylic Acid      Metamucil         Miralax AVOID:        Senakot   Accutane    Cough:  Retin-A       Cough Drops  Tetracycline      Phenergan w/ Codeine if Rx  Minocycline      Robitussin (Plain & DM)  Antibiotics:     Crabs/Lice:  Ceclor       RID  Cephalosporins    AVOID:  E-Mycins      Kwell  Keflex  Macrobid/Macrodantin   Diarrhea:  Penicillin      Kao-Pectate  Zithromax      Imodium AD         PUSH FLUIDS AVOID:       Cipro     Fever:  Tetracycline      Tylenol (Regular or Extra  Minocycline       Strength)  Levaquin      Extra Strength-Do not          Exceed 8 tabs/24 hrs Caffeine:        <200mg/day (equiv. To 1 cup of coffee or  approx. 3 12 oz sodas)         Gas: Cold/Hayfever:       Gas-X  Benadryl      Mylicon  Claritin       Phazyme  **Claritin-D        Chlor-Trimeton    Headaches:  Dimetapp      ASA-Free Excedrin  Drixoral-Non-Drowsy     Cold Compress  Mucinex (Guaifenasin)     Tylenol (Regular or Extra  Sudafed/Sudafed-12 Hour     Strength)  **Sudafed PE Pseudoephedrine   Tylenol Cold & Sinus     Vicks Vapor Rub  Zyrtec  **AVOID if Problems With Blood Pressure         Heartburn: Avoid lying down for at least 1 hour after meals  Aciphex      Maalox     Rash:  Milk of Magnesia     Benadryl    Mylanta       1% Hydrocortisone Cream  Pepcid  Pepcid Complete   Sleep Aids:  Prevacid      Ambien   Prilosec       Benadryl  Rolaids       Chamomile Tea  Tums (Limit 4/day)     Unisom         Tylenol PM         Warm milk-add vanilla or  Hemorrhoids:       Sugar for taste  Anusol/Anusol H.C.  (RX: Analapram 2.5%)  Sugar Substitutes:  Hydrocortisone OTC     Ok in moderation  Preparation H      Tucks        Vaseline lotion applied to tissue with  wiping    Herpes:     Throat:  Acyclovir      Oragel  Famvir  Valtrex     Vaccines:         Flu Shot Leg Cramps:       *Gardasil  Benadryl      Hepatitis A         Hepatitis B Nasal Spray:         Pneumovax  Saline Nasal Spray     Polio Booster         Tetanus Nausea:       Tuberculosis test or PPD  Vitamin B6 25 mg TID   AVOID:    Dramamine      *Gardasil  Emetrol       Live Poliovirus  Ginger Root 250 mg QID    MMR (measles, mumps &  High Complex Carbs @ Bedtime    rebella)  Sea Bands-Accupressure    Varicella (Chickenpox)  Unisom 1/2 tab TID     *No known complications           If received before Pain:         Known pregnancy;   Darvocet       Resume series after  Lortab        Delivery  Percocet    Yeast:   Tramadol      Femstat  Tylenol 3      Gyne-lotrimin  Ultram       Monistat  Vicodin           MISC:         All Sunscreens           Hair Coloring/highlights          Insect Repellant's          (Including DEET)         Mystic Tans Morning Sickness  Morning sickness is when you feel like you may vomit (feel nauseous) during pregnancy. Sometimes, you may vomit. Morning sickness most often happens in the morning, but it can also happen at any time of the day. Some women may have morning sickness that makes them vomit all the time. This is a more serious problem that needs treatment. What are the causes? The cause of this condition is not known. What increases the risk? You had vomiting or a feeling like you may vomit before your pregnancy. You had morning sickness in another pregnancy. You are pregnant with more than one baby, such as twins. What are the signs or symptoms? Feeling like you may vomit. Vomiting. How is this treated? Treatment is usually not needed for this condition. You may only need to change what you eat. In some cases, your doctor may give you some things to take for your condition. These include: Vitamin B6 supplements. Medicines to treat the  feeling that you may vomit. Ginger. Follow these instructions at home: Medicines Take over-the-counter and prescription medicines only as told by your doctor. Do not take any medicines until you talk with your doctor about them first. Take multivitamins before you get pregnant. These can stop or lessen the symptoms of morning sickness. Eating and drinking Eat dry toast or crackers before getting out of bed. Eat 5 or 6 small meals a day. Eat dry and bland foods like rice and baked potatoes. Do not eat greasy, fatty, or spicy foods. Have someone cook for you if the smell of food causes you to vomit or to feel like you may vomit. If you feel like you may vomit after taking prenatal vitamins, take them at night or with a snack. Eat protein foods when you need a snack. Nuts, yogurt, and cheese are good choices. Drink fluids throughout the day. Try ginger ale made with real ginger, ginger tea made from fresh grated ginger, or ginger candies. General instructions Do not smoke or use any products that contain nicotine or tobacco. If  you need help quitting, ask your doctor. Use an air purifier to keep the air in your house free of smells. Get lots of fresh air. Try to avoid smells that make you feel sick. Try wearing an acupressure wristband. This is a wristband that is used to treat seasickness. Try a treatment called acupuncture. In this treatment, a doctor puts needles into certain areas of your body to make you feel better. Contact a doctor if: You need medicine to feel better. You feel dizzy or light-headed. You are losing weight. Get help right away if: The feeling that you may vomit will not go away, or you cannot stop vomiting. You faint. You have very bad pain in your belly. Summary Morning sickness is when you feel like you may vomit (feel nauseous) during pregnancy. You may feel sick in the morning, but you can feel this way at any time of the day. Making some changes to what you  eat may help your symptoms go away. This information is not intended to replace advice given to you by your health care provider. Make sure you discuss any questions you have with your health care provider. Document Revised: 10/28/2019 Document Reviewed: 10/07/2019 Elsevier Patient Education  2023 [employer]. First Trimester of Pregnancy  The first trimester of pregnancy starts on the first day of your last menstrual period until the end of week 12. This is also called months 1 through 3 of pregnancy. Body changes during your first trimester Your body goes through many changes during pregnancy. The changes usually return to normal after your baby is born. Physical changes You may gain or lose weight. Your breasts may grow larger and hurt. The area around your nipples may get darker. Dark spots or blotches may develop on your face. You may have changes in your hair. Health changes You may feel like you might vomit (nauseous), and you may vomit. You may have heartburn. You may have headaches. You may have trouble pooping (constipation). Your gums may bleed. Other changes You may get tired easily. You may pee (urinate) more often. Your menstrual periods will stop. You may not feel hungry. You may want to eat certain kinds of food. You may have changes in your emotions from day to day. You may have more dreams. Follow these instructions at home: Medicines Take over-the-counter and prescription medicines only as told by your doctor. Some medicines are not safe during pregnancy. Take a prenatal vitamin that contains at least 600 micrograms (mcg) of folic acid. Eating and drinking Eat healthy meals that include: Fresh fruits and vegetables. Whole grains. Good sources of protein, such as meat, eggs, or tofu. Low-fat dairy products. Avoid raw meat and unpasteurized juice, milk, and cheese. If you feel like you may vomit, or you vomit: Eat 4 or 5 small meals a day instead of 3 large  meals. Try eating a few soda crackers. Drink liquids between meals instead of during meals. You may need to take these actions to prevent or treat trouble pooping: Drink enough fluids to keep your pee (urine) pale yellow. Eat foods that are high in fiber. These include beans, whole grains, and fresh fruits and vegetables. Limit foods that are high in fat and sugar. These include fried or sweet foods. Activity Exercise only as told by your doctor. Most people can do their usual exercise routine during pregnancy. Stop exercising if you have cramps or pain in your lower belly (abdomen) or low back. Do not exercise if it is too  hot or too humid, or if you are in a place of great height (high altitude). Avoid heavy lifting. If you choose to, you may have sex unless your doctor tells you not to. Relieving pain and discomfort Wear a good support bra if your breasts are sore. Rest with your legs raised (elevated) if you have leg cramps or low back pain. If you have bulging veins (varicose veins) in your legs: Wear support hose as told by your doctor. Raise your feet for 15 minutes, 3-4 times a day. Limit salt in your food. Safety Wear your seat belt at all times when you are in a car. Talk with your doctor if someone is hurting you or yelling at you. Talk with your doctor if you are feeling sad or have thoughts of hurting yourself. Lifestyle Do not use hot tubs, steam rooms, or saunas. Do not douche. Do not use tampons or scented sanitary pads. Do not use herbal medicines, illegal drugs, or medicines that are not approved by your doctor. Do not drink alcohol. Do not smoke or use any products that contain nicotine or tobacco. If you need help quitting, ask your doctor. Avoid cat litter boxes and soil that is used by cats. These carry germs that can cause harm to the baby and can cause a loss of your baby by miscarriage or stillbirth. General instructions Keep all follow-up visits. This is  important. Ask for help if you need counseling or if you need help with nutrition. Your doctor can give you advice or tell you where to go for help. Visit your dentist. At home, brush your teeth with a soft toothbrush. Floss gently. Write down your questions. Take them to your prenatal visits. Where to find more information American Pregnancy Association: americanpregnancy.org [employer]: www.acog.org Office on Women's Health: [employer].nz Contact a doctor if: You are dizzy. You have a fever. You have mild cramps or pressure in your lower belly. You have a nagging pain in your belly area. You continue to feel like you may vomit, you vomit, or you have watery poop (diarrhea) for 24 hours or longer. You have a bad-smelling fluid coming from your vagina. You have pain when you pee. You are exposed to a disease that spreads from person to person, such as chickenpox, measles, Zika virus, HIV, or hepatitis. Get help right away if: You have spotting or bleeding from your vagina. You have very bad belly cramping or pain. You have shortness of breath or chest pain. You have any kind of injury, such as from a fall or a car crash. You have new or increased pain, swelling, or redness in an arm or leg. Summary The first trimester of pregnancy starts on the first day of your last menstrual period until the end of week 12 (months 1 through 3). Eat 4 or 5 small meals a day instead of 3 large meals. Do not smoke or use any products that contain nicotine or tobacco. If you need help quitting, ask your doctor. Keep all follow-up visits. This information is not intended to replace advice given to you by your health care provider. Make sure you discuss any questions you have with your health care provider. Document Revised: 08/21/2019 Document Reviewed: 06/27/2019 Elsevier Patient Education  2023 [employer] Inc[employer] Commonly Asked Questions During  Pregnancy  Cats: A parasite can be excreted in cat feces.  To avoid exposure you need to have another person empty the little box.  If you must empty  the litter box you will need to wear gloves.  Wash your hands after handling your cat.  This parasite can also be found in raw or undercooked meat so this should also be avoided.  Colds, Sore Throats, Flu: Please check your medication sheet to see what you can take for symptoms.  If your symptoms are unrelieved by these medications please call the office.  Dental Work: Most any dental work Investment banker, corporate recommends is permitted.  X-rays should only be taken during the first trimester if absolutely necessary.  Your abdomen should be shielded with a lead apron during all x-rays.  Please notify your provider prior to receiving any x-rays.  Novocaine is fine; gas is not recommended.  If your dentist requires a note from [place] prior to dental work please call the office and we will provide one for you.  Exercise: Exercise is an important part of staying healthy during your pregnancy.  You may continue most exercises you were accustomed to prior to pregnancy.  Later in your pregnancy you will most likely notice you have difficulty with activities requiring balance like riding a bicycle.  It is important that you listen to your body and avoid activities that put you at a higher risk of falling.  Adequate rest and staying well hydrated are a must!  If you have questions about the safety of specific activities ask your provider.    Exposure to Children with illness: Try to avoid obvious exposure; report any symptoms to [place] when noted,  If you have chicken pos, red measles or mumps, you should be immune to these diseases.   Please do not take any vaccines while pregnant unless you have checked with your OB provider.  Fetal Movement: After 28 weeks we recommend you do "kick counts" twice daily.  Lie or sit down in a calm quiet environment and count your baby movements  "kicks".  You should feel your baby at least 10 times per hour.  If you have not felt 10 kicks within the first hour get up, walk around and have something sweet to eat or drink then repeat for an additional hour.  If count remains less than 10 per hour notify your provider.  Fumigating: Follow your pest control agent's advice as to how long to stay out of your home.  Ventilate the area well before re-entering.  Hemorrhoids:   Most over-the-counter preparations can be used during pregnancy.  Check your medication to see what is safe to use.  It is important to use a stool softener or fiber in your diet and to drink lots of liquids.  If hemorrhoids seem to be getting worse please call the office.   Hot Tubs:  Hot tubs Jacuzzis and saunas are not recommended while pregnant.  These increase your internal body temperature and should be avoided.  Intercourse:  Sexual intercourse is safe during pregnancy as long as you are comfortable, unless otherwise advised by your provider.  Spotting may occur after intercourse; report any bright red bleeding that is heavier than spotting.  Labor:  If you know that you are in labor, please go to the hospital.  If you are unsure, please call the office and let us help you decide what to do.  Lifting, straining, etc:  If your job requires heavy lifting or straining please check with your provider for any limitations.  Generally, you should not lift items heavier than that you can lift simply with your hands and arms (no back muscles)  Painting:  Paint fumes do not harm your pregnancy, but may make you ill and should be avoided if possible.  Latex or water based paints have less odor than oils.  Use adequate ventilation while painting.  Permanents & Hair Color:  Chemicals in hair dyes are not recommended as they cause increase hair dryness which can increase hair loss during pregnancy.  " Highlighting" and permanents are allowed.  Dye may be absorbed differently and  permanents may not hold as well during pregnancy.  Sunbathing:  Use a sunscreen, as skin burns easily during pregnancy.  Drink plenty of fluids; avoid over heating.  Tanning Beds:  Because their possible side effects are still unknown, tanning beds are not recommended.  Ultrasound Scans:  Routine ultrasounds are performed at approximately 20 weeks.  You will be able to see your baby's general anatomy an if you would like to know the gender this can usually be determined as well.  If it is questionable when you conceived you may also receive an ultrasound early in your pregnancy for dating purposes.  Otherwise ultrasound exams are not routinely performed unless there is a medical necessity.  Although you can request a scan we ask that you pay for it when conducted because insurance does not cover " patient request" scans.  Work: If your pregnancy proceeds without complications you may work until your due date, unless your physician or employer advises otherwise.  Round Ligament Pain/Pelvic Discomfort:  Sharp, shooting pains not associated with bleeding are fairly common, usually occurring in the second trimester of pregnancy.  They tend to be worse when standing up or when you remain standing for long periods of time.  These are the result of pressure of certain pelvic ligaments called "round ligaments".  Rest, Tylenol and heat seem to be the most effective relief.  As the womb and fetus grow, they rise out of the pelvis and the discomfort improves.  Please notify the office if your pain seems different than that described.  It may represent a more serious condition.

## 2022-04-25 NOTE — Progress Notes (Cosign Needed)
    NURSE VISIT NOTE  Subjective:    Patient ID: HANIYYAH SAKUMA, female    DOB: [birth year], 22 y.o.   MRN: [id number]  HPI  Patient is a 22 y.o. G29P1001 female who presents for pregnancy confirmation. UPT resulted as positive. LMP is unknown. Pregnancy is desired. Pregnancy was planned. Sexual Activity:1 partner(fiance). Contraception: None Current symptoms also include: 3 Positive home pregnancy test, abdominal pain, nausea and vomiting. Last menstrual period was abnormal.  She wanted Zofran called into pharmacy for her nausea vomiting, she knows other alternatives did not work with her previous pregnancy. She has been advised to schedule a dating U/S,NOB Nurse Interview, and Lab visit at checkout.   Patient states no other questions or concerns.   The following portions of the patient's history were reviewed and updated as appropriate: allergies, current medications, past family history, past medical history, past social history, past surgical history, and problem list.  Review of Systems Pertinent items noted in HPI and remainder of comprehensive ROS otherwise negative.   Objective:   not currently breastfeeding. Body mass index is 44.07 kg/m. Blood pressure 135/72, pulse (!) 107, resp. rate 16, height 5\' 5"  (1.651 m), weight 264 lb 12.8 oz (120.1 kg), not currently breastfeeding.   General appearance: alert, cooperative, and no distress  Assessment:   1. Amenorrhea      Plan:   Lab Orders         POCT urine pregnancy         Cristy Folks, [place]

## 2022-05-02 ENCOUNTER — Other Ambulatory Visit: Payer: Self-pay | Admitting: Obstetrics and Gynecology

## 2022-05-02 ENCOUNTER — Ambulatory Visit
Admission: RE | Admit: 2022-05-02 | Discharge: 2022-05-02 | Disposition: A | Discharge status: Home / Self Care | Payer: Medicaid Other | Source: Ambulatory Visit | Attending: Obstetrics and Gynecology | Admitting: Obstetrics and Gynecology

## 2022-05-02 DIAGNOSIS — Z789 Other specified health status: Secondary | ICD-10-CM | POA: Insufficient documentation

## 2022-05-02 DIAGNOSIS — O3680X0 Pregnancy with inconclusive fetal viability, not applicable or unspecified: Secondary | ICD-10-CM

## 2022-05-06 ENCOUNTER — Ambulatory Visit: Payer: Medicaid Other

## 2022-05-18 ENCOUNTER — Ambulatory Visit: Payer: Medicaid Other

## 2022-06-15 DIAGNOSIS — O0993 Supervision of high risk pregnancy, unspecified, third trimester: Secondary | ICD-10-CM | POA: Insufficient documentation

## 2022-06-23 ENCOUNTER — Other Ambulatory Visit: Payer: Self-pay | Admitting: Obstetrics

## 2022-06-23 DIAGNOSIS — Z3689 Encounter for other specified antenatal screening: Secondary | ICD-10-CM

## 2022-07-06 ENCOUNTER — Other Ambulatory Visit: Payer: Self-pay

## 2022-07-06 ENCOUNTER — Emergency Department: Payer: Medicaid Other

## 2022-07-06 ENCOUNTER — Emergency Department
Admission: EM | Admit: 2022-07-06 | Discharge: 2022-07-06 | Disposition: A | Discharge status: Home / Self Care | Payer: Medicaid Other | Attending: Emergency Medicine | Admitting: Emergency Medicine

## 2022-07-06 DIAGNOSIS — O219 Vomiting of pregnancy, unspecified: Secondary | ICD-10-CM | POA: Insufficient documentation

## 2022-07-06 DIAGNOSIS — Z3A14 14 weeks gestation of pregnancy: Secondary | ICD-10-CM | POA: Diagnosis not present

## 2022-07-06 DIAGNOSIS — O24419 Gestational diabetes mellitus in pregnancy, unspecified control: Secondary | ICD-10-CM | POA: Insufficient documentation

## 2022-07-06 DIAGNOSIS — R197 Diarrhea, unspecified: Secondary | ICD-10-CM | POA: Insufficient documentation

## 2022-07-06 DIAGNOSIS — Z1152 Encounter for screening for COVID-19: Secondary | ICD-10-CM | POA: Diagnosis not present

## 2022-07-06 DIAGNOSIS — O26892 Other specified pregnancy related conditions, second trimester: Secondary | ICD-10-CM | POA: Insufficient documentation

## 2022-07-06 DIAGNOSIS — O99891 Other specified diseases and conditions complicating pregnancy: Secondary | ICD-10-CM | POA: Diagnosis present

## 2022-07-06 DIAGNOSIS — R1030 Lower abdominal pain, unspecified: Secondary | ICD-10-CM | POA: Diagnosis not present

## 2022-07-06 DIAGNOSIS — R55 Syncope and collapse: Secondary | ICD-10-CM | POA: Insufficient documentation

## 2022-07-06 DIAGNOSIS — O132 Gestational [pregnancy-induced] hypertension without significant proteinuria, second trimester: Secondary | ICD-10-CM | POA: Diagnosis not present

## 2022-07-06 DIAGNOSIS — R112 Nausea with vomiting, unspecified: Secondary | ICD-10-CM

## 2022-07-06 LAB — URINALYSIS, ROUTINE W REFLEX MICROSCOPIC
Bilirubin Urine: NEGATIVE
Glucose, UA: NEGATIVE mg/dL
Hgb urine dipstick: NEGATIVE
Ketones, ur: 20 mg/dL — AB
Nitrite: NEGATIVE
Protein, ur: NEGATIVE mg/dL
Specific Gravity, Urine: 1.01 (ref 1.005–1.030)
pH: 5 (ref 5.0–8.0)

## 2022-07-06 LAB — CBC
HCT: 38.7 % (ref 36.0–46.0)
Hemoglobin: 13 g/dL (ref 12.0–15.0)
MCH: 28.5 pg (ref 26.0–34.0)
MCHC: 33.6 g/dL (ref 30.0–36.0)
MCV: 84.9 fL (ref 80.0–100.0)
Platelets: 227 10*3/uL (ref 150–400)
RBC: 4.56 MIL/uL (ref 3.87–5.11)
RDW: 12.6 % (ref 11.5–15.5)
WBC: 7.3 10*3/uL (ref 4.0–10.5)
nRBC: 0 % (ref 0.0–0.2)

## 2022-07-06 LAB — BASIC METABOLIC PANEL
Anion gap: 10 (ref 5–15)
BUN: 7 mg/dL (ref 6–20)
CO2: 21 mmol/L — ABNORMAL LOW (ref 22–32)
Calcium: 8.7 mg/dL — ABNORMAL LOW (ref 8.9–10.3)
Chloride: 105 mmol/L (ref 98–111)
Creatinine, Ser: 0.45 mg/dL (ref 0.44–1.00)
GFR, Estimated: >60 mL/min (ref >60)
Glucose, Bld: 122 mg/dL — ABNORMAL HIGH (ref 70–99)
Potassium: 3.5 mmol/L (ref 3.5–5.1)
Sodium: 136 mmol/L (ref 135–145)

## 2022-07-06 LAB — RESP PANEL BY RT-PCR (RSV, FLU A&B, COVID)  RVPGX2
Influenza A by PCR: NEGATIVE
Influenza B by PCR: NEGATIVE
Resp Syncytial Virus by PCR: NEGATIVE
SARS Coronavirus 2 by RT PCR: NEGATIVE

## 2022-07-06 LAB — HCG, QUANTITATIVE, PREGNANCY: hCG, Beta Chain, Quant, S: 65520 m[IU]/mL — ABNORMAL HIGH (ref <5)

## 2022-07-06 LAB — MAGNESIUM: Magnesium: 1.7 mg/dL (ref 1.7–2.4)

## 2022-07-06 MED ORDER — SODIUM CHLORIDE 0.9 % IV BOLUS
1000.0000 mL | Freq: Once | INTRAVENOUS | Status: AC
  Administered 2022-07-06: 1000 mL via INTRAVENOUS

## 2022-07-06 MED ORDER — ONDANSETRON 4 MG PO TBDP: 4.0000 mg | ORAL_TABLET | Freq: Three times a day (TID) | ORAL | 0 refills | Status: DC | PRN

## 2022-07-06 MED ORDER — METOCLOPRAMIDE HCL 10 MG PO TABS: 10.0000 mg | ORAL_TABLET | Freq: Three times a day (TID) | ORAL | 1 refills | Status: DC | PRN

## 2022-07-06 NOTE — ED Triage Notes (Signed)
Pt here via [facility] with /co of having a near syncope episode [facility] EMS's arrival. Pt states things going black but denies synopsizing. Pt is [redacted] weeks pregnant at this time, pt denies any bleeding. Pt states vomiting this morning. Pt has HX of HTN and gestational diabetes.    350 ml of IVF given 4 mg of zofran given 20G R AC

## 2022-07-06 NOTE — ED Provider Notes (Signed)
[facility] Provider Note    Event Date/Time   First MD Initiated Contact with Patient 07/06/22 1638     (approximate)   History   Near Syncope ([redacted] weeks pregnant)   HPI  Stacy Soto is a 22 y.o. female G2, P1 at approximately [redacted] weeks gestational age presents to the emergency department following a near syncopal episode.  States that she woke up this morning at approximately 6:00 with multiple episodes of nausea and vomiting.  Also endorses diarrhea that started around the same time.  States that she tried to drink water, eat something easy in order to settle her stomach.  States that she had ongoing episodes of nausea and vomiting.  Attempted to take a shower and was taking a cold shower and when she went to step out felt like she was going to pass out.  States that her vision got very dark and she felt like she was weak all over.  Had to call for her husband to help get her out of the shower.  Checked her blood pressure and it was elevated.  When EMS arrived blood pressure continued to be elevated, she received IV fluids and IV Zofran.  Glucose was 106.  States that she is feeling slightly better since receiving the Zofran and fluids.  Endorses some lower abdominal pain.  Denies any vaginal bleeding or leakage of fluids.  No dysuria, urinary urgency or frequency.  Denies any chest pain or shortness of breath.  No significant upper abdominal pain.  Mild headache yesterday.  Has a history of gestational diabetes and essential hypertension.  Followed by high risk OB.     Physical Exam   Triage Vital Signs: ED Triage Vitals  Enc Vitals Group     BP 07/06/22 1636 (!) 160/89     Pulse Rate 07/06/22 1636 100     Resp 07/06/22 1636 17     Temp 07/06/22 1636 98.5 F (36.9 C)     Temp src --      SpO2 07/06/22 1636 98 %     Weight --      Height --      Head Circumference --      Peak Flow --      Pain Score 07/06/22 1628 0     Pain Loc --      Pain Edu? --       Excl. in GC? --     Most recent vital signs: Vitals:   07/06/22 1636  BP: (!) 160/89  Pulse: 100  Resp: 17  Temp: 98.5 F (36.9 C)  SpO2: 98%    Physical Exam Constitutional:      Appearance: She is well-developed.  HENT:     Head: Atraumatic.  Eyes:     Conjunctiva/sclera: Conjunctivae normal.  Cardiovascular:     Rate and Rhythm: Regular rhythm.  Pulmonary:     Effort: No respiratory distress.  Abdominal:     General: There is no distension.     Tenderness: There is abdominal tenderness (Mild lower abdominal tenderness palpation.  No CVA tenderness.).  Musculoskeletal:        General: Normal range of motion.     Cervical back: Normal range of motion.  Skin:    General: Skin is warm.  Neurological:     Mental Status: She is alert. Mental status is at baseline.     IMPRESSION / MDM / ASSESSMENT AND PLAN / ED COURSE  I reviewed the  triage vital signs and the nursing notes.  Differential diagnosis including viral illness including COVID/influenza, viral gastroenteritis, nausea and vomiting of pregnancy, torsion.  No significant right lower quadrant abdominal tenderness to palpation of the low suspicion for acute appendicitis.  Patient without any chest pain or shortness of breath have a low suspicion for ACS, dissection or pulmonary embolism.  EKG  I, Corena Herter, the attending physician, personally viewed and interpreted this ECG.   Rate: Normal  Rhythm: Normal sinus  Axis: Normal  Intervals: Normal  ST&T Change: None  No tachycardic or bradycardic dysrhythmias while on cardiac telemetry.  RADIOLOGY I independently reviewed imaging, my interpretation of imaging: Ultrasound showed IUP with gestational age of [redacted] weeks.  Normal fetal heart rate.  No free fluid.  LABS (all labs ordered are listed, but only abnormal results are displayed) Labs interpreted as -    Labs Reviewed  BASIC METABOLIC PANEL - Abnormal; Notable for the following components:       Result Value   CO2 21 (*)    Glucose, Bld 122 (*)    Calcium 8.7 (*)    All other components within normal limits  URINALYSIS, ROUTINE W REFLEX MICROSCOPIC - Abnormal; Notable for the following components:   Color, Urine YELLOW (*)    APPearance HAZY (*)    Ketones, ur 20 (*)    Leukocytes,Ua SMALL (*)    Bacteria, UA RARE (*)    All other components within normal limits  HCG, QUANTITATIVE, PREGNANCY - Abnormal; Notable for the following components:   hCG, Beta Chain, Quant, S 65,520 (*)    All other components within normal limits  RESP PANEL BY RT-PCR (RSV, FLU A&B, COVID)  RVPGX2  URINE CULTURE  CBC  MAGNESIUM     MDM  Lab work overall reassuring.  UA without findings consistent with a urinary tract infection.  Did have some white cells and leukocytes however otherwise not consistent with asymptomatic bacteria, urine culture was obtained.  Patient on reevaluation was tolerating p.o.  Continues to have a benign abdominal exam, have a low suspicion for acute appendicitis.  Will start the patient on a prescription for Reglan as needed for nausea and vomiting.  Discussed close follow-up with her gynecologist to recheck her blood pressure.  PROCEDURES:  Critical Care performed: No  Procedures  Patient's presentation is most consistent with acute presentation with potential threat to life or bodily function.   MEDICATIONS ORDERED IN ED: Medications  sodium chloride 0.9 % bolus 1,000 mL (1,000 mLs Intravenous New Bag/Given 07/06/22 1714)    FINAL CLINICAL IMPRESSION(S) / ED DIAGNOSES   Final diagnoses:  Near syncope  Nausea vomiting and diarrhea     Rx / DC Orders   ED Discharge Orders          Ordered    ondansetron (ZOFRAN-ODT) 4 MG disintegrating tablet  Every 8 hours PRN,   Status:  Discontinued        07/06/22 1920    metoCLOPramide (REGLAN) 10 MG tablet  Every 8 hours PRN        07/06/22 1920             Note:  This document was prepared using  Dragon voice recognition software and may include unintentional dictation errors.   Corena Herter, MD 07/06/22 [name]

## 2022-07-06 NOTE — Discharge Instructions (Signed)
You are seen in the emergency department after almost passing out from nausea and vomiting.  You were given IV fluids in the emergency department.  Your lab work was normal.  You had an ultrasound done that showed good fetal heart rate, measuring at [redacted] weeks gestational age.  Follow-up closely with your primary care provider and your gynecologist.  Return to the emergency department for any worsening symptoms.  Stay hydrated and drink plenty of fluids.  zofran (ondansetron) - nausea medication, take 1 tablet every 8 hours as needed for nausea/vomiting.

## 2022-07-08 LAB — URINE CULTURE: Culture: 20000 — AB

## 2022-07-12 ENCOUNTER — Encounter: Payer: Medicaid Other | Admitting: Dietician

## 2022-07-12 ENCOUNTER — Encounter: Payer: Self-pay | Admitting: Dietician

## 2022-07-12 DIAGNOSIS — O24419 Gestational diabetes mellitus in pregnancy, unspecified control: Secondary | ICD-10-CM | POA: Diagnosis present

## 2022-07-12 DIAGNOSIS — Z713 Dietary counseling and surveillance: Secondary | ICD-10-CM | POA: Diagnosis not present

## 2022-07-12 DIAGNOSIS — Z3A15 15 weeks gestation of pregnancy: Secondary | ICD-10-CM | POA: Insufficient documentation

## 2022-07-12 NOTE — Progress Notes (Signed)
Patient was seen for Gestational Diabetes self-management on 07/12/22  Start time 1535 and End time 1645   Estimated due date: 12/2022; [redacted]w[redacted]d  No Interpreter Needed  Clinical: Medications: reviewed Medical History: anxiety, depression, gestational diabetes, heart murmur, hypertension Labs: OGTT 2 hour /dL, 1 hour OGTT /dL Z6X 0.9% (08/29/52)  Dietary and Lifestyle History: Pt states she had diabetes in her previous pregnancy and is familiar with blood glucose testing.   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   Pt states she mostly just drinks water. If she goes out to eat she may drink soda/tea but this is only occasionally.   Pt states she has not been able to eat greasy foods because it makes her feel like she could throw up.   Physical Activity: 2 hours daily Stress: moderate-to-high stress Sleep: not assessed  24 hr Recall: Not assessed (GDM appointment taught as group class)  NUTRITION INTERVENTION  Nutrition education (E-1) on the following topics:   Initial Follow-up    Definition of Gestational Diabetes   Why dietary management is important in controlling blood glucose   Effects each nutrient has on blood glucose levels   Simple carbohydrates vs complex carbohydrates   Fluid intake   Creating a balanced meal plan   Carbohydrate counting    When to check blood glucose levels   Proper blood glucose monitoring techniques   Effect of stress and stress reduction techniques    Exercise effect on blood glucose levels, appropriate exercise during pregnancy   Importance of limiting caffeine and abstaining from alcohol and smoking   Medications used for blood sugar control during pregnancy   Hypoglycemia and rule of 15   Postpartum self care  Blood glucose monitor given: [employer] Lot # [id number] Exp: 2020-10-25  Patient does not have a meter prior to  visit. Patient is not testing pre breakfast and 2 hours after each meal yet, but plans to start after this visit.   Patient instructed to monitor glucose levels: FBS: 60 - ? 95 mg/dL; 2 hour: ? 829 mg/dL  Patient received handouts: Nutrition Diabetes and Pregnancy Carbohydrate Counting List  Patient will be seen for follow-up as needed.

## 2022-08-03 ENCOUNTER — Other Ambulatory Visit: Payer: Self-pay

## 2022-08-08 ENCOUNTER — Other Ambulatory Visit: Payer: Self-pay

## 2022-08-08 ENCOUNTER — Ambulatory Visit: Payer: Medicaid Other | Attending: Maternal & Fetal Medicine

## 2022-08-08 ENCOUNTER — Ambulatory Visit (HOSPITAL_BASED_OUTPATIENT_CLINIC_OR_DEPARTMENT_OTHER): Payer: Medicaid Other | Admitting: Maternal & Fetal Medicine

## 2022-08-08 DIAGNOSIS — Z7982 Long term (current) use of aspirin: Secondary | ICD-10-CM | POA: Insufficient documentation

## 2022-08-08 DIAGNOSIS — O24410 Gestational diabetes mellitus in pregnancy, diet controlled: Secondary | ICD-10-CM | POA: Diagnosis not present

## 2022-08-08 DIAGNOSIS — R7303 Prediabetes: Secondary | ICD-10-CM

## 2022-08-08 DIAGNOSIS — Z3A19 19 weeks gestation of pregnancy: Secondary | ICD-10-CM

## 2022-08-08 DIAGNOSIS — O99212 Obesity complicating pregnancy, second trimester: Secondary | ICD-10-CM | POA: Diagnosis not present

## 2022-08-08 DIAGNOSIS — O24419 Gestational diabetes mellitus in pregnancy, unspecified control: Secondary | ICD-10-CM | POA: Insufficient documentation

## 2022-08-08 DIAGNOSIS — O10012 Pre-existing essential hypertension complicating pregnancy, second trimester: Secondary | ICD-10-CM | POA: Diagnosis not present

## 2022-08-08 DIAGNOSIS — Z3689 Encounter for other specified antenatal screening: Secondary | ICD-10-CM

## 2022-08-08 DIAGNOSIS — O24415 Gestational diabetes mellitus in pregnancy, controlled by oral hypoglycemic drugs: Secondary | ICD-10-CM | POA: Insufficient documentation

## 2022-08-08 DIAGNOSIS — O4442 Low lying placenta NOS or without hemorrhage, second trimester: Secondary | ICD-10-CM

## 2022-08-08 DIAGNOSIS — O10912 Unspecified pre-existing hypertension complicating pregnancy, second trimester: Secondary | ICD-10-CM | POA: Diagnosis not present

## 2022-08-08 DIAGNOSIS — E669 Obesity, unspecified: Secondary | ICD-10-CM

## 2022-08-08 NOTE — Progress Notes (Signed)
Patient information  Patient Name: Stacy Soto  Patient MRN:   [id number]  Referring practice: MFM Referring Provider: Chari Manning, CNM  MFM CONSULT  TOBA CARNEY is a 22 y.o. G2P1001 at [redacted]w[redacted]d here for ultrasound and consultation. EDD is 01/02/23 based on an early [place].   RE CHTN: BP is well controlled on Labetalol 100mg  BID. Compliant with 81 mg ASA. Discussed BP goals and clinical implications of CHTN in pregnancy.   RE prediabets with early gDM: No glucose log today but reports good glycemic control with diet alone. She is aware of the importance of proper glycemic control during pregnancy. We also discussed the increased risk of CHD and need for fetal echo. Referral placed today.   RE low-lying placenta: the placenta is 1.8 cm from the internal os. I dicussed the clinical implications as well as the likelihood that the placenta will be > 2 cm away from the internal os by the time of delivery. Discussed bleeding precautions as well.   Sonographic findings Single intrauterine pregnancy. Fetal cardiac activity:  Observed and appears normal. Presentation: Cephalic. The anatomic structures that were well seen appear normal without evidence of soft markers. Due to poor acoustic windows, the visualization some structures remain suboptimally seen. Fetal biometry shows the estimated fetal weight at the 57 percentile. Amniotic fluid volume: Within normal limits. MVP: 3.95 cm. Placenta: Posterior, low-lying, 1.8 cm from int os.   Assessment Chronic hypertension with exacerbation during pregnancy in second trimester  Prediabetes  Obesity (BMI 30-39.9)  Gestational diabetes mellitus (GDM) controlled on oral hypoglycemic drug, antepartum Plan -Detailed ultrasound was done today without abnormalities. Some of the anatomy remains suboptimally visualized.  -Baseline preeclampsia labs: CMP, CBC and urine protein/creatinine ratio if not previously completed.  -Continue Aspirin 81 mg for  preeclampsia prophylasis -Follow-up anatomy, placental assessment and fetal growth in 4 to 6 weeks -Serial growth ultrasounds starting around 28 weeks to monitor for fetal growth restriction -Fetal echo has been arranged at [facility] -Antenatal testing to start around 32 weeks due to the increased risk of stillbirth and high risk pregnancy -Delivery timing pending clinical course but likely around 37 weeks gestion -Continue routine prenatal care with referring OB provider  Review of Systems: A review of systems was performed and was negative except per HPI   Vitals and Physical Exam    08/08/2022    9:56 AM 07/06/2022    7:30 PM 07/06/2022    4:36 PM  Vitals with BMI  Height 5\' 5"     Weight 260 lbs 8 oz    BMI 43.35    Systolic 132 138 409  Diastolic 73 80 89  Pulse 92 83 100  Sitting comfortably on the sonogram table Nonlabored breathing Normal rate and rhythm Abdomen is nontender  Past pregnancies OB History  Gravida Para Term Preterm AB Living  2 1 1    0 1  SAB IAB Ectopic Multiple Live Births  0     0 1    # Outcome Date GA Lbr Len/2nd Weight Sex Delivery Anes PTL Lv  2 Current           1 Term 07/27/20 [redacted]w[redacted]d 12:40 / 01:20 2770 g F Vag-Spont EPI  LIV     I spent 60 minutes reviewing the patients chart, including labs and images as well as counseling the patient about her medical conditions. Greater than 50% of the time was spent in direct face-to-face patient counseling.  Braxton Feathers  MFM, [facility]  08/08/2022  11:34 AM

## 2022-09-06 ENCOUNTER — Observation Stay
Admission: EM | Admit: 2022-09-06 | Discharge: 2022-09-06 | Disposition: A | Discharge status: Home / Self Care | Payer: Medicaid Other | Attending: Obstetrics and Gynecology | Admitting: Obstetrics and Gynecology

## 2022-09-06 ENCOUNTER — Other Ambulatory Visit: Payer: Self-pay

## 2022-09-06 ENCOUNTER — Encounter: Payer: Self-pay | Admitting: Emergency Medicine

## 2022-09-06 DIAGNOSIS — O26899 Other specified pregnancy related conditions, unspecified trimester: Secondary | ICD-10-CM | POA: Diagnosis present

## 2022-09-06 DIAGNOSIS — Z79899 Other long term (current) drug therapy: Secondary | ICD-10-CM | POA: Diagnosis not present

## 2022-09-06 DIAGNOSIS — Z3A23 23 weeks gestation of pregnancy: Secondary | ICD-10-CM | POA: Diagnosis not present

## 2022-09-06 DIAGNOSIS — R109 Unspecified abdominal pain: Secondary | ICD-10-CM | POA: Diagnosis not present

## 2022-09-06 DIAGNOSIS — O26892 Other specified pregnancy related conditions, second trimester: Principal | ICD-10-CM | POA: Insufficient documentation

## 2022-09-06 DIAGNOSIS — O26812 Pregnancy related exhaustion and fatigue, second trimester: Secondary | ICD-10-CM | POA: Diagnosis not present

## 2022-09-06 DIAGNOSIS — Z7982 Long term (current) use of aspirin: Secondary | ICD-10-CM | POA: Insufficient documentation

## 2022-09-06 DIAGNOSIS — R42 Dizziness and giddiness: Secondary | ICD-10-CM | POA: Diagnosis not present

## 2022-09-06 DIAGNOSIS — Z87891 Personal history of nicotine dependence: Secondary | ICD-10-CM | POA: Diagnosis not present

## 2022-09-06 DIAGNOSIS — O10012 Pre-existing essential hypertension complicating pregnancy, second trimester: Secondary | ICD-10-CM | POA: Insufficient documentation

## 2022-09-06 LAB — COMPREHENSIVE METABOLIC PANEL
ALT: 11 U/L (ref 0–44)
AST: 17 U/L (ref 15–41)
Albumin: 3.2 g/dL — ABNORMAL LOW (ref 3.5–5.0)
Alkaline Phosphatase: 48 U/L (ref 38–126)
Anion gap: 11 (ref 5–15)
BUN: 7 mg/dL (ref 6–20)
CO2: 18 mmol/L — ABNORMAL LOW (ref 22–32)
Calcium: 8.9 mg/dL (ref 8.9–10.3)
Chloride: 104 mmol/L (ref 98–111)
Creatinine, Ser: 0.39 mg/dL — ABNORMAL LOW (ref 0.44–1.00)
GFR, Estimated: >60 mL/min (ref >60)
Glucose, Bld: 108 mg/dL — ABNORMAL HIGH (ref 70–99)
Potassium: 4 mmol/L (ref 3.5–5.1)
Sodium: 133 mmol/L — ABNORMAL LOW (ref 135–145)
Total Bilirubin: 0.5 mg/dL (ref 0.3–1.2)
Total Protein: 6.8 g/dL (ref 6.5–8.1)

## 2022-09-06 LAB — CBC
HCT: 36.4 % (ref 36.0–46.0)
Hemoglobin: 12.4 g/dL (ref 12.0–15.0)
MCH: 29.4 pg (ref 26.0–34.0)
MCHC: 34.1 g/dL (ref 30.0–36.0)
MCV: 86.3 fL (ref 80.0–100.0)
Platelets: 258 10*3/uL (ref 150–400)
RBC: 4.22 MIL/uL (ref 3.87–5.11)
RDW: 13.2 % (ref 11.5–15.5)
WBC: 8.4 10*3/uL (ref 4.0–10.5)
nRBC: 0 % (ref 0.0–0.2)

## 2022-09-06 LAB — URINALYSIS, ROUTINE W REFLEX MICROSCOPIC
Bilirubin Urine: NEGATIVE
Bilirubin Urine: NEGATIVE
Glucose, UA: >=500 mg/dL — AB
Glucose, UA: 50 mg/dL — AB
Hgb urine dipstick: NEGATIVE
Hgb urine dipstick: NEGATIVE
Ketones, ur: NEGATIVE mg/dL
Ketones, ur: 20 mg/dL — AB
Leukocytes,Ua: NEGATIVE
Nitrite: NEGATIVE
Nitrite: NEGATIVE
Protein, ur: NEGATIVE mg/dL
Protein, ur: NEGATIVE mg/dL
Specific Gravity, Urine: 1.012 (ref 1.005–1.030)
Specific Gravity, Urine: 1.017 (ref 1.005–1.030)
pH: 5 (ref 5.0–8.0)
pH: 5 (ref 5.0–8.0)

## 2022-09-06 LAB — TROPONIN I (HIGH SENSITIVITY)
Troponin I (High Sensitivity): 3 ng/L (ref <18)
Troponin I (High Sensitivity): 3 ng/L (ref <18)

## 2022-09-06 LAB — LIPASE, BLOOD: Lipase: 27 U/L (ref 11–51)

## 2022-09-06 MED ORDER — ONDANSETRON HCL 4 MG/2ML IJ SOLN
4.0000 mg | Freq: Once | INTRAMUSCULAR | Status: AC
  Administered 2022-09-06: 4 mg via INTRAVENOUS
  Filled 2022-09-06: qty 2

## 2022-09-06 MED ORDER — SODIUM CHLORIDE 0.9 % IV BOLUS
1000.0000 mL | Freq: Once | INTRAVENOUS | Status: AC
  Administered 2022-09-06: 1000 mL via INTRAVENOUS

## 2022-09-06 MED ORDER — ACETAMINOPHEN 500 MG PO TABS: 1000.0000 mg | ORAL_TABLET | Freq: Four times a day (QID) | ORAL | Status: DC | PRN

## 2022-09-06 NOTE — Discharge Summary (Signed)
Stacy Soto is a 22 y.o. female. She is at [redacted]w[redacted]d gestation. Patient's last menstrual period was 02/18/2022. 01/02/2023, by Ultrasound   Prenatal care site: [facility]  Chief complaint: lower abdominal pain  HPI: Stacy Soto presents to L&D with complaints of lower abdominal pain.  She initially presented to the ED with complaints of ongoing vertigo and feeling like she was going to pass out.  Blood pressure and blood sugar was normal at the time. She was evaluated and had a normal ECG and labs WNL for pregnancy.  She was transferred to L&D for toco monitoring and to further evaluate for abdominal pain.  Stacy Soto states that she is starting to feel better since being treated in the ED.  Symptoms have improved and headache is improving with rest. She denies cramping or contractions, LOF, or vaginal bleeding.  Endorses good fetal movement.   S: Resting comfortably. no CTX, no VB.no LOF,  Active fetal movement.   Maternal Medical History:  Past Medical Hx:  has a past medical history of Anxiety, Atypical chest pain, Depression, Gestational diabetes, Heart murmur, Hypertension, IBS (irritable bowel syndrome), Mitral regurgitation, Morbid obesity (HCC), Orthodontics, Palpitations, Pregnancy induced hypertension, and PVCs (premature ventricular contractions).    Past Surgical Hx:  has a past surgical history that includes Adenoidectomy (2005); Tonsillectomy (2005); Septoplasty (N/A, 06/28/2016); Nasal turbinate reduction (Left, 06/28/2016); and Tonsillectomy.   No Known Allergies   Prior to Admission medications   Medication Sig Start Date End Date Taking? Authorizing Provider  acetaminophen (TYLENOL) 325 MG tablet Take 325 mg by mouth as needed.   Yes [provider]  aspirin 81 MG chewable tablet Chew 81 mg by mouth daily.   Yes [provider]  escitalopram (LEXAPRO) 10 MG tablet Take 10 mg by mouth daily.   Yes [provider]  labetalol (NORMODYNE) 100 MG tablet  Take 100 mg by mouth 2 (two) times daily.   Yes [provider]  NIFEdipine (ADALAT CC) 90 MG 24 hr tablet Take 90 mg by mouth daily.   Yes [provider]  Prenatal Vit-Fe Fumarate-FA (PRENATAL MULTIVITAMIN) TABS tablet Take 1 tablet by mouth daily at 12 noon.   Yes [provider]  metoCLOPramide (REGLAN) 10 MG tablet Take 1 tablet (10 mg total) by mouth every 8 (eight) hours as needed for nausea or vomiting. 07/06/22 08/05/22  Corena Herter, MD  ondansetron (ZOFRAN) 4 MG tablet Take 1 tablet (4 mg total) by mouth every 8 (eight) hours as needed for nausea or vomiting. 04/25/22   Hildred Laser, MD    Social History: She  reports that she quit smoking about 2 years ago. Her smoking use included e-cigarettes. She has never used smokeless tobacco. She reports that she does not currently use alcohol. She reports that she does not use drugs.  Family History: family history includes Breast cancer in her paternal grandmother; Diabetes in her father and paternal grandfather; Other in her mother; Polycystic ovary syndrome in her mother.   Review of Systems: A full review of systems was performed and negative except as noted in the HPI.     Pertinent Results:  Prenatal Labs:  O:  BP (!) 129/57 (BP Location: Left Arm)   Pulse 66   Temp 98.6 F (37 C) (Oral)   Resp 16   Ht 5\' 5"  (1.651 m)   Wt 117.5 kg   LMP 02/18/2022   SpO2 99%   BMI 43.11 kg/m  Results for orders placed or performed during  the hospital encounter of 09/06/22 (from the past 48 hour(s))  Lipase, blood   Collection Time: 09/06/22 11:32 AM  Result Value Ref Range   Lipase 27 11 - 51 U/L  Comprehensive metabolic panel   Collection Time: 09/06/22 11:32 AM  Result Value Ref Range   Sodium 133 (L) 135 - 145 mmol/L   Potassium 4.0 3.5 - 5.1 mmol/L   Chloride 104 98 - 111 mmol/L   CO2 18 (L) 22 - 32 mmol/L   Glucose, Bld 108 (H) 70 - 99 mg/dL   BUN 7 6 - 20 mg/dL   Creatinine, Ser 1.61 (L) 0.44 -  1.00 mg/dL   Calcium 8.9 8.9 - 09.6 mg/dL   Total Protein 6.8 6.5 - 8.1 g/dL   Albumin 3.2 (L) 3.5 - 5.0 g/dL   AST 17 15 - 41 U/L   ALT 11 0 - 44 U/L   Alkaline Phosphatase 48 38 - 126 U/L   Total Bilirubin 0.5 0.3 - 1.2 mg/dL   GFR, Estimated >04 >54 mL/min   Anion gap 11 5 - 15  CBC   Collection Time: 09/06/22 11:32 AM  Result Value Ref Range   WBC 8.4 4.0 - 10.5 K/uL   RBC 4.22 3.87 - 5.11 MIL/uL   Hemoglobin 12.4 12.0 - 15.0 g/dL   HCT 09.8 11.9 - 14.7 %   MCV 86.3 80.0 - 100.0 fL   MCH 29.4 26.0 - 34.0 pg   MCHC 34.1 30.0 - 36.0 g/dL   RDW 82.9 56.2 - 13.0 %   Platelets 258 150 - 400 K/uL   nRBC 0.0 0.0 - 0.2 %  Urinalysis, Routine w reflex microscopic -Urine, Clean Catch   Collection Time: 09/06/22 11:32 AM  Result Value Ref Range   Color, Urine YELLOW (A) YELLOW   APPearance HAZY (A) CLEAR   Specific Gravity, Urine 1.012 1.005 - 1.030   pH 5.0 5.0 - 8.0   Glucose, UA >=500 (A) NEGATIVE mg/dL   Hgb urine dipstick NEGATIVE NEGATIVE   Bilirubin Urine NEGATIVE NEGATIVE   Ketones, ur NEGATIVE NEGATIVE mg/dL   Protein, ur NEGATIVE NEGATIVE mg/dL   Nitrite NEGATIVE NEGATIVE   Leukocytes,Ua TRACE (A) NEGATIVE   RBC / HPF 0-5 0 - 5 RBC/hpf   WBC, UA 0-5 0 - 5 WBC/hpf   Bacteria, UA FEW (A) NONE SEEN   Squamous Epithelial / HPF 6-10 0 - 5 /HPF   Mucus PRESENT   Troponin I (High Sensitivity)   Collection Time: 09/06/22 11:32 AM  Result Value Ref Range   Troponin I (High Sensitivity) 3 <18 ng/L  Troponin I (High Sensitivity)   Collection Time: 09/06/22  1:28 PM  Result Value Ref Range   Troponin I (High Sensitivity) 3 <18 ng/L  Urinalysis, Routine w reflex microscopic -Urine, Clean Catch   Collection Time: 09/06/22  2:57 PM  Result Value Ref Range   Color, Urine YELLOW (A) YELLOW   APPearance HAZY (A) CLEAR   Specific Gravity, Urine 1.017 1.005 - 1.030   pH 5.0 5.0 - 8.0   Glucose, UA 50 (A) NEGATIVE mg/dL   Hgb urine dipstick NEGATIVE NEGATIVE   Bilirubin Urine  NEGATIVE NEGATIVE   Ketones, ur 20 (A) NEGATIVE mg/dL   Protein, ur NEGATIVE NEGATIVE mg/dL   Nitrite NEGATIVE NEGATIVE   Leukocytes,Ua NEGATIVE NEGATIVE     Constitutional: NAD, AAOx3  HE/ENT: extraocular movements grossly intact, moist mucous membranes PULM: nl respiratory effort Abd: gravid, non-tender, non-distended, soft  Ext: Non-tender, Nonedmeatous  Psych: mood appropriate, speech normal Pelvic : deferred  Doppler: 135 bpm, distinguished from maternal pulse Toco: quiet   Assessment: 22 y.o. G2P1001 [redacted]w[redacted]d 01/02/2023, by Ultrasound   Principle diagnosis: Lightheadedness [R42] Abdominal cramping affecting pregnancy [O26.899, R10.9] Abdominal pain affecting pregnancy [O26.899, R10.9]   Plan: 1) Abdominal pain in pregnancy -Preterm labor not present  -Discussed warning signs to return to L&D triage with  -Reviewed comfort measures   2) Vasovagal syncopal episode in pregnancy  -Reviewed warning signs and prevention methods  -Likely related to normal physiologic changes in pregnancy  -Recommend close monitoring of her blood pressure and blood sugar to avoid both hypotensive and hypoglycemic episodes.    3) Disposition: discharge home stable -Precautions reviewed  -Follow up as scheduled    ----- Margaretmary Eddy, CNM Certified Nurse Midwife [facility]  Cl[facility] OB/[facility]N [facility]

## 2022-09-06 NOTE — Discharge Instructions (Signed)
You were seen in the ER today for your lightheadedness and abdominal cramping.  Your blood work and EKG were reassuring.  We are sending you up to labor and delivery so they can monitor you regarding your abdominal pain.  Return to the ER for any new or worsening symptoms.

## 2022-09-06 NOTE — OB Triage Note (Signed)
Patient is a 22 yo, G2P1, at 23 weeks and 1 day. Patient presents to L&D from the ED after having blurry vision and passing out at her place of work. Patient reports abdominal pain rating a 7/10. Patient reports currently having blurry vision and a headache rating a 4/10. Patient denies any vaginal bleeding or LOF. Patient reports +FM. Patient reports feeling like she has braxton hicks contractions. Monitors applied and assessing. Initial fetal heart tone is 145. Margaretmary Eddy, CNM aware of patients arrival to unit. Plan to perform a UA, assess FHTs and contractions.

## 2022-09-06 NOTE — ED Provider Notes (Signed)
[facility] Provider Note    Event Date/Time   First MD Initiated Contact with Patient 09/06/22 1300     (approximate)   History   Abdominal Pain   HPI  Stacy Soto is a 22 y.o. female G2P1 at [redacted] weeks gestation presenting to the emergency department for evaluation of lightheadedness.  Patient woke up this morning and had some nausea with decreased appetite.  No vomiting.  While she was at work, she had onset of lightheadedness with multiple episodes of near syncope.  Did not actually pass out.  She has also had intermittent abdominal pain that she says feels contraction-like in nature.  No vaginal bleeding, loss of fluids.  Is feeling baby move.  Does have a history of preterm labor at 32 weeks and her first pregnancy.  This pregnancy has been complicated by gestational diabetes and hypertension.    Physical Exam   Triage Vital Signs: ED Triage Vitals  Enc Vitals Group     BP 09/06/22 1129 134/82     Pulse Rate 09/06/22 1129 94     Resp 09/06/22 1129 17     Temp 09/06/22 1129 98.1 F (36.7 C)     Temp Source 09/06/22 1129 Oral     SpO2 09/06/22 1129 99 %     Weight 09/06/22 1129 260 lb (117.9 kg)     Height 09/06/22 1300 5\' 5"  (1.651 m)     Head Circumference --      Peak Flow --      Pain Score 09/06/22 1129 7     Pain Loc --      Pain Edu? --      Excl. in GC? --     Most recent vital signs: Vitals:   09/06/22 1129 09/06/22 1444  BP: 134/82 (!) 129/57  Pulse: 94 66  Resp: 17 16  Temp: 98.1 F (36.7 C) 98.6 F (37 C)  SpO2: 99%      General: Awake, interactive  CV:  Regular rate, good peripheral perfusion.  Resp:  Lungs clear, unlabored respirations.  Abd:  Soft, nondistended, no significant tenderness to palpation Neuro:  Symmetric facial movement, fluid speech   ED Results / Procedures / Treatments   Labs (all labs ordered are listed, but only abnormal results are displayed) Labs Reviewed  COMPREHENSIVE METABOLIC PANEL  - Abnormal; Notable for the following components:      Result Value   Sodium 133 (*)    CO2 18 (*)    Glucose, Bld 108 (*)    Creatinine, Ser 0.39 (*)    Albumin 3.2 (*)    All other components within normal limits  URINALYSIS, ROUTINE W REFLEX MICROSCOPIC - Abnormal; Notable for the following components:   Color, Urine YELLOW (*)    APPearance HAZY (*)    Glucose, UA >=500 (*)    Leukocytes,Ua TRACE (*)    Bacteria, UA FEW (*)    All other components within normal limits  URINALYSIS, ROUTINE W REFLEX MICROSCOPIC - Abnormal; Notable for the following components:   Color, Urine YELLOW (*)    APPearance HAZY (*)    Glucose, UA 50 (*)    Ketones, ur 20 (*)    All other components within normal limits  LIPASE, BLOOD  CBC  TROPONIN I (HIGH SENSITIVITY)  TROPONIN I (HIGH SENSITIVITY)     EKG EKG independently reviewed interpreted by myself (ER attending) demonstrates:  EKG demonstrates normal sinus rhythm rate of 87, PR 166,  QRS 84, QTc 425, no acute ST changes  RADIOLOGY Imaging independently reviewed and interpreted by myself demonstrates:    PROCEDURES:  Critical Care performed: No  Procedures   MEDICATIONS ORDERED IN ED: Medications  acetaminophen (TYLENOL) tablet 1,000 mg (has no administration in time range)  ondansetron (ZOFRAN) injection 4 mg (4 mg Intravenous Given 09/06/22 1336)  sodium chloride 0.9 % bolus 1,000 mL (0 mLs Intravenous Stopped 09/06/22 1429)     IMPRESSION / MDM / ASSESSMENT AND PLAN / ED COURSE  I reviewed the triage vital signs and the nursing notes.  Differential diagnosis includes, but is not limited to, anemia, electrolyte abnormality, vasovagal symptoms, early labor,  Patient's presentation is most consistent with acute presentation with potential threat to life or bodily function.  22 year old female presenting with lightheadedness and abdominal pain. FHT 161.  Lab work overall reassuring as well as EKG.  Given concerns for  contraction type pain at 24 weeks, will discuss with OB/GYN.  Case discussed with CNM Margaretmary Eddy with [facility].  She did recommend discharging the patient from the ER to have her go up to L&D for further monitoring.  Patient updated on this plan and is comfortable.     FINAL CLINICAL IMPRESSION(S) / ED DIAGNOSES   Final diagnoses:  Lightheadedness  Abdominal cramping affecting pregnancy     Rx / DC Orders   ED Discharge Orders     None        Note:  This document was prepared using Dragon voice recognition software and may include unintentional dictation errors.   Trinna Post, MD 09/06/22 [phone]

## 2022-09-06 NOTE — ED Notes (Signed)
Report called to L&D.

## 2022-09-06 NOTE — ED Triage Notes (Signed)
Arrives with c/o abdominal cramping, dizziness, ears ringing, and weakness.  Patient is [redacted] weeks pregnant, EDC:  01/02/2023.  G2 P1.  Reports fetal movement.  Denies fluid leaking/vaginal bleeding.  AAOx3.  Skin warm and dry. NAD.  MAE equally and strong.  Ambulates with steady gait.

## 2022-09-06 NOTE — OB Triage Note (Signed)
Pt discharged home per order.   Pt stable and ambulatory and an After Visit Summary was printed and given to the patient. Discharge education completed with patient/family including follow up instructions, appointments, and medication list. Pt received labor precautions. Patient able to verbalize understanding, all questions fully answered upon discharge.

## 2022-09-06 NOTE — ED Triage Notes (Addendum)
Pt sts that she was at work when she started to get very dizzy and faint. Pt is [redacted] weeks pregnant and is G2P2A0. Pt sts that since the dizziness she has started to get abd pain. Pt sts that the baby has been kicking and moving around normally. Pt sts that she does have frequent PVC and Mitral regurgitation. Pt sts that she has not seen a cardiologist since last year.

## 2022-09-12 ENCOUNTER — Ambulatory Visit: Payer: Medicaid Other | Attending: Maternal & Fetal Medicine

## 2022-09-12 ENCOUNTER — Other Ambulatory Visit: Payer: Self-pay

## 2022-09-12 VITALS — BP 133/80 | HR 104 | Temp 97.9°F | Ht 65.0 in | Wt 262.0 lb

## 2022-09-12 DIAGNOSIS — O24415 Gestational diabetes mellitus in pregnancy, controlled by oral hypoglycemic drugs: Secondary | ICD-10-CM | POA: Diagnosis not present

## 2022-09-12 DIAGNOSIS — Z3A24 24 weeks gestation of pregnancy: Secondary | ICD-10-CM | POA: Diagnosis not present

## 2022-09-12 DIAGNOSIS — O10012 Pre-existing essential hypertension complicating pregnancy, second trimester: Secondary | ICD-10-CM

## 2022-09-12 DIAGNOSIS — O10912 Unspecified pre-existing hypertension complicating pregnancy, second trimester: Secondary | ICD-10-CM | POA: Diagnosis present

## 2022-09-12 DIAGNOSIS — E669 Obesity, unspecified: Secondary | ICD-10-CM

## 2022-09-12 DIAGNOSIS — O99212 Obesity complicating pregnancy, second trimester: Secondary | ICD-10-CM | POA: Insufficient documentation

## 2022-09-12 DIAGNOSIS — O24410 Gestational diabetes mellitus in pregnancy, diet controlled: Secondary | ICD-10-CM | POA: Diagnosis not present

## 2022-09-12 DIAGNOSIS — O4442 Low lying placenta NOS or without hemorrhage, second trimester: Secondary | ICD-10-CM | POA: Diagnosis not present

## 2022-09-28 ENCOUNTER — Encounter: Payer: Self-pay | Admitting: Obstetrics and Gynecology

## 2022-09-28 ENCOUNTER — Observation Stay
Admission: EM | Admit: 2022-09-28 | Discharge: 2022-09-28 | Disposition: A | Discharge status: Home / Self Care | Payer: Medicaid Other | Attending: Obstetrics and Gynecology | Admitting: Obstetrics and Gynecology

## 2022-09-28 ENCOUNTER — Other Ambulatory Visit: Payer: Self-pay

## 2022-09-28 DIAGNOSIS — R1013 Epigastric pain: Secondary | ICD-10-CM | POA: Diagnosis not present

## 2022-09-28 DIAGNOSIS — O24415 Gestational diabetes mellitus in pregnancy, controlled by oral hypoglycemic drugs: Secondary | ICD-10-CM | POA: Insufficient documentation

## 2022-09-28 DIAGNOSIS — O26892 Other specified pregnancy related conditions, second trimester: Secondary | ICD-10-CM | POA: Diagnosis present

## 2022-09-28 DIAGNOSIS — Z7984 Long term (current) use of oral hypoglycemic drugs: Secondary | ICD-10-CM | POA: Insufficient documentation

## 2022-09-28 DIAGNOSIS — O26899 Other specified pregnancy related conditions, unspecified trimester: Secondary | ICD-10-CM | POA: Diagnosis present

## 2022-09-28 DIAGNOSIS — Z79899 Other long term (current) drug therapy: Secondary | ICD-10-CM | POA: Insufficient documentation

## 2022-09-28 DIAGNOSIS — Z3A26 26 weeks gestation of pregnancy: Secondary | ICD-10-CM | POA: Diagnosis not present

## 2022-09-28 DIAGNOSIS — Z87891 Personal history of nicotine dependence: Secondary | ICD-10-CM | POA: Diagnosis not present

## 2022-09-28 DIAGNOSIS — Z7982 Long term (current) use of aspirin: Secondary | ICD-10-CM | POA: Insufficient documentation

## 2022-09-28 DIAGNOSIS — R1012 Left upper quadrant pain: Secondary | ICD-10-CM | POA: Insufficient documentation

## 2022-09-28 DIAGNOSIS — O0992 Supervision of high risk pregnancy, unspecified, second trimester: Secondary | ICD-10-CM | POA: Diagnosis not present

## 2022-09-28 DIAGNOSIS — R102 Pelvic and perineal pain: Secondary | ICD-10-CM | POA: Diagnosis not present

## 2022-09-28 DIAGNOSIS — O10012 Pre-existing essential hypertension complicating pregnancy, second trimester: Secondary | ICD-10-CM | POA: Insufficient documentation

## 2022-09-28 LAB — CBC
HCT: 33 % — ABNORMAL LOW (ref 36.0–46.0)
Hemoglobin: 11.2 g/dL — ABNORMAL LOW (ref 12.0–15.0)
MCH: 29 pg (ref 26.0–34.0)
MCHC: 33.9 g/dL (ref 30.0–36.0)
MCV: 85.5 fL (ref 80.0–100.0)
Platelets: 219 10*3/uL (ref 150–400)
RBC: 3.86 MIL/uL — ABNORMAL LOW (ref 3.87–5.11)
RDW: 13.4 % (ref 11.5–15.5)
WBC: 7.4 10*3/uL (ref 4.0–10.5)
nRBC: 0 % (ref 0.0–0.2)

## 2022-09-28 LAB — COMPREHENSIVE METABOLIC PANEL
ALT: 11 U/L (ref 0–44)
AST: 14 U/L — ABNORMAL LOW (ref 15–41)
Albumin: 2.9 g/dL — ABNORMAL LOW (ref 3.5–5.0)
Alkaline Phosphatase: 53 U/L (ref 38–126)
Anion gap: 9 (ref 5–15)
BUN: 7 mg/dL (ref 6–20)
CO2: 19 mmol/L — ABNORMAL LOW (ref 22–32)
Calcium: 8.5 mg/dL — ABNORMAL LOW (ref 8.9–10.3)
Chloride: 106 mmol/L (ref 98–111)
Creatinine, Ser: 0.34 mg/dL — ABNORMAL LOW (ref 0.44–1.00)
GFR, Estimated: >60 mL/min (ref >60)
Glucose, Bld: 102 mg/dL — ABNORMAL HIGH (ref 70–99)
Potassium: 3.6 mmol/L (ref 3.5–5.1)
Sodium: 134 mmol/L — ABNORMAL LOW (ref 135–145)
Total Bilirubin: 0.5 mg/dL (ref 0.3–1.2)
Total Protein: 6.3 g/dL — ABNORMAL LOW (ref 6.5–8.1)

## 2022-09-28 LAB — PROTEIN / CREATININE RATIO, URINE
Creatinine, Urine: 155 mg/dL
Protein Creatinine Ratio: 0.13 mg/mg{Cre} (ref 0.00–0.15)
Total Protein, Urine: 20 mg/dL

## 2022-09-28 MED ORDER — PRENATAL MULTIVITAMIN CH: 1.0000 | ORAL_TABLET | Freq: Every day | ORAL | Status: DC

## 2022-09-28 MED ORDER — ACETAMINOPHEN 325 MG PO TABS: 650.0000 mg | ORAL_TABLET | ORAL | Status: DC | PRN

## 2022-09-28 MED ORDER — ZOLPIDEM TARTRATE 5 MG PO TABS: 5.0000 mg | ORAL_TABLET | Freq: Every evening | ORAL | Status: DC | PRN

## 2022-09-28 MED ORDER — CALCIUM CARBONATE ANTACID 500 MG PO CHEW: 2.0000 | CHEWABLE_TABLET | ORAL | Status: DC | PRN

## 2022-09-28 MED ORDER — LACTATED RINGERS IV SOLN: 125.0000 mL/h | INTRAVENOUS | Status: DC

## 2022-09-28 MED ORDER — DOCUSATE SODIUM 100 MG PO CAPS: 100.0000 mg | ORAL_CAPSULE | Freq: Every day | ORAL | Status: DC

## 2022-09-28 NOTE — OB Triage Note (Signed)
Pt discharged home per order.   Pt stable and ambulatory and an After Visit Summary was printed and given to the patient. Discharge education completed with patient/family including follow up instructions, appointments, and medication list. Pt received labor precautions. Patient able to verbalize understanding, all questions fully answered upon discharge.   

## 2022-09-28 NOTE — Discharge Summary (Signed)
Stacy Soto is a 22 y.o. female. She is at [redacted]w[redacted]d gestation. Patient's last menstrual period was 02/18/2022. Estimated Date of Delivery: 01/02/23  Prenatal care site: [facility]   Current pregnancy complicated by:  - chronic HTN - obesity - GDM - Varicella and Rubella non-immune - Depression and anxiety  Chief complaint: upper abdominal pain, vaginal pressure and "feels like something is in my vagina."  She states this morning she began feeling LUQ pain that radiated to her epigastric area. The pain felt like a "stabbing, tearing" feeling. The pain has gradually improved a little bit. She also feels like the baby is sitting in her vagina and she is feeling "very fluid and wet" in her vagina, but denies leaking any fluid.   S: Resting comfortably. no CTX, no VB.no LOF,  Active fetal movement.  Denies: HA, visual changes, SOB, or RUQ/epigastric pain  Maternal Medical History:   Past Medical History:  Diagnosis Date   Anxiety    Atypical chest pain    Depression    Gestational diabetes    taking glyburide   Heart murmur    mild mitral regurigation by echo   Hypertension    IBS (irritable bowel syndrome)    Mitral regurgitation    a. 03/2020 Echo: EF 60-65%, no rwma, nl RV fxn. Mild MR.   Morbid obesity (HCC)    Orthodontics    wears braces   Palpitations    Pregnancy induced hypertension    PVCs (premature ventricular contractions)    a. 06/2021 [facility]: Avg HR 94 (51-152). Isolated PVCs w/ burden of 22%. Triggered events = sinus rhythm w/ PVCs.    Past Surgical History:  Procedure Laterality Date   ADENOIDECTOMY  2005   NASAL TURBINATE REDUCTION Left 06/28/2016   Procedure: TURBINATE REDUCTION/SUBMUCOSAL RESECTION;  Surgeon: Geanie Logan, MD;  Location: [facility];  Service: ENT;  Laterality: Left;   SEPTOPLASTY N/A 06/28/2016   Procedure: SEPTOPLASTY;  Surgeon: Geanie Logan, MD;  Location: [facility];  Service: ENT;  Laterality: N/A;    TONSILLECTOMY  2005   TONSILLECTOMY      No Known Allergies  Prior to Admission medications   Medication Sig Start Date End Date Taking? Authorizing Provider  aspirin 81 MG chewable tablet Chew 81 mg by mouth daily.   Yes [provider]  escitalopram (LEXAPRO) 10 MG tablet Take 10 mg by mouth daily.   Yes [provider]  labetalol (NORMODYNE) 100 MG tablet Take 100 mg by mouth 2 (two) times daily.   Yes [provider]  NIFEdipine (ADALAT CC) 90 MG 24 hr tablet Take 90 mg by mouth daily.   Yes [provider]  Prenatal Vit-Fe Fumarate-FA (PRENATAL MULTIVITAMIN) TABS tablet Take 1 tablet by mouth daily at 12 noon.   Yes [provider]  promethazine (PHENERGAN) 25 MG tablet Take 25 mg by mouth every 6 (six) hours as needed for nausea or vomiting.   Yes [provider]  acetaminophen (TYLENOL) 325 MG tablet Take 325 mg by mouth as needed.    [provider]  metoCLOPramide (REGLAN) 10 MG tablet Take 1 tablet (10 mg total) by mouth every 8 (eight) hours as needed for nausea or vomiting. 07/06/22 08/05/22  Corena Herter, MD  ondansetron (ZOFRAN) 4 MG tablet Take 1 tablet (4 mg total) by mouth every 8 (eight) hours as needed for nausea or vomiting. Patient not taking: Reported on 09/12/2022 04/25/22   Hildred Laser, MD  Social History: She  reports that she quit smoking about 2 years ago. Her smoking use included e-cigarettes. She has never used smokeless tobacco. She reports that she does not currently use alcohol. She reports that she does not use drugs.  Family History: family history includes Breast cancer in her paternal grandmother; Diabetes in her father and paternal grandfather; Other in her mother; Polycystic ovary syndrome in her mother.  no history of gyn cancers  Review of Systems: A full review of systems was performed and negative except as noted in the HPI.     O:  BP 105/63 (BP Location: Left Arm)   Pulse 82    Temp 97.8 F (36.6 C) (Oral)   Resp 18   Ht 5\' 5"  (1.651 m)   Wt 117.9 kg   LMP 02/18/2022   BMI 43.27 kg/m  Vitals:   09/28/22 1125  BP: 105/63    Results for orders placed or performed during the hospital encounter of 09/28/22 (from the past 48 hour(s))  Protein / creatinine ratio, urine   Collection Time: 09/28/22 12:03 PM  Result Value Ref Range   Creatinine, Urine 155 mg/dL   Total Protein, Urine 20 mg/dL   Protein Creatinine Ratio 0.13 0.00 - 0.15 mg/mg[Cre]  CBC   Collection Time: 09/28/22 12:10 PM  Result Value Ref Range   WBC 7.4 4.0 - 10.5 K/uL   RBC 3.86 (L) 3.87 - 5.11 MIL/uL   Hemoglobin 11.2 (L) 12.0 - 15.0 g/dL   HCT 40.9 (L) 81.1 - 91.4 %   MCV 85.5 80.0 - 100.0 fL   MCH 29.0 26.0 - 34.0 pg   MCHC 33.9 30.0 - 36.0 g/dL   RDW 78.2 95.6 - 21.3 %   Platelets 219 150 - 400 K/uL   nRBC 0.0 0.0 - 0.2 %  Comprehensive metabolic panel   Collection Time: 09/28/22 12:10 PM  Result Value Ref Range   Sodium 134 (L) 135 - 145 mmol/L   Potassium 3.6 3.5 - 5.1 mmol/L   Chloride 106 98 - 111 mmol/L   CO2 19 (L) 22 - 32 mmol/L   Glucose, Bld 102 (H) 70 - 99 mg/dL   BUN 7 6 - 20 mg/dL   Creatinine, Ser 0.86 (L) 0.44 - 1.00 mg/dL   Calcium 8.5 (L) 8.9 - 10.3 mg/dL   Total Protein 6.3 (L) 6.5 - 8.1 g/dL   Albumin 2.9 (L) 3.5 - 5.0 g/dL   AST 14 (L) 15 - 41 U/L   ALT 11 0 - 44 U/L   Alkaline Phosphatase 53 38 - 126 U/L   Total Bilirubin 0.5 0.3 - 1.2 mg/dL   GFR, Estimated >57 >84 mL/min   Anion gap 9 5 - 15     Constitutional: NAD, AAOx3  HE/ENT: extraocular movements grossly intact, moist mucous membranes CV: RRR PULM: nl respiratory effort, CTABL     Abd: gravid, non-tender, non-distended, soft      Ext: Non-tender, Nonedematous   Psych: mood appropriate, speech normal Pelvic: SSE done - cervix closed and thick, physiologic white discharge noted in the vagina  Pelvic exam: normal external genitalia, vulva, vagina, cervix, uterus and adnexa.  Fetal   monitoring: Cat 1 Appropriate for GA Baseline: 130bpm Variability: moderate Accelerations: present x >2 Decelerations absent Time Contractions: absent   A/P: 22 y.o. [redacted]w[redacted]d here for antenatal surveillance for epigastric and abdominal pain, vaginal pressure  Principle Diagnosis:  High risk pregnancy in third trimester  Epigastric pain: pain improved and resolved  while she was present in L&D and she felt better, PIH labs normal, BP normal Vaginal pressure: cervix closed and long, no bulging membranes Labor: not present.  Fetal Wellbeing: Reassuring Cat 1 tracing. Reactive NST  D/c home stable, precautions reviewed, follow-up as scheduled.    Janyce Llanos, CNM 09/28/2022 1:27 PM

## 2022-09-28 NOTE — Progress Notes (Signed)
Pt presents to L/D triage from work after bending down and feeling a sharp/stabbing pain on her left side that radiates across her abdomen. She described the pain as constant, rated 5/10. She reports that the pain has been somewhat relieved with rest. Pt reports an increase in vaginal pressure. She also reports a feeling of lower abdominal/pelvic pressure/wetness with no accompanied vaginal leaking of fluid. Pt reports no recent intercourse. No bleeding and positive fetal movement. Monitors applied and assessing. Initial FHT 140. VSS. Pt reports no PIH symptoms and regular CBG this morning.  CNM notified of patient's arrival. Labs collected.

## 2022-10-03 ENCOUNTER — Ambulatory Visit: Payer: Medicaid Other | Admitting: Adult Health

## 2022-10-04 ENCOUNTER — Encounter: Payer: Self-pay | Admitting: Adult Health

## 2022-10-10 ENCOUNTER — Other Ambulatory Visit: Payer: Self-pay

## 2022-10-10 ENCOUNTER — Ambulatory Visit: Payer: Medicaid Other | Attending: Obstetrics and Gynecology

## 2022-10-10 DIAGNOSIS — O10912 Unspecified pre-existing hypertension complicating pregnancy, second trimester: Secondary | ICD-10-CM

## 2022-10-10 DIAGNOSIS — O99213 Obesity complicating pregnancy, third trimester: Secondary | ICD-10-CM | POA: Insufficient documentation

## 2022-10-10 DIAGNOSIS — O10013 Pre-existing essential hypertension complicating pregnancy, third trimester: Secondary | ICD-10-CM | POA: Diagnosis not present

## 2022-10-10 DIAGNOSIS — Z3A28 28 weeks gestation of pregnancy: Secondary | ICD-10-CM | POA: Diagnosis not present

## 2022-10-10 DIAGNOSIS — O24415 Gestational diabetes mellitus in pregnancy, controlled by oral hypoglycemic drugs: Secondary | ICD-10-CM

## 2022-10-10 DIAGNOSIS — O99212 Obesity complicating pregnancy, second trimester: Secondary | ICD-10-CM | POA: Diagnosis not present

## 2022-10-10 DIAGNOSIS — O4443 Low lying placenta NOS or without hemorrhage, third trimester: Secondary | ICD-10-CM

## 2022-10-10 DIAGNOSIS — O24410 Gestational diabetes mellitus in pregnancy, diet controlled: Secondary | ICD-10-CM | POA: Diagnosis not present

## 2022-10-26 ENCOUNTER — Other Ambulatory Visit: Payer: Self-pay

## 2022-10-26 DIAGNOSIS — E669 Obesity, unspecified: Secondary | ICD-10-CM

## 2022-10-26 DIAGNOSIS — O10912 Unspecified pre-existing hypertension complicating pregnancy, second trimester: Secondary | ICD-10-CM

## 2022-10-26 DIAGNOSIS — O24415 Gestational diabetes mellitus in pregnancy, controlled by oral hypoglycemic drugs: Secondary | ICD-10-CM

## 2022-10-28 DIAGNOSIS — E538 Deficiency of other specified B group vitamins: Secondary | ICD-10-CM | POA: Diagnosis present

## 2022-10-28 DIAGNOSIS — E559 Vitamin D deficiency, unspecified: Secondary | ICD-10-CM | POA: Diagnosis present

## 2022-11-01 ENCOUNTER — Other Ambulatory Visit: Payer: Self-pay | Admitting: Certified Nurse Midwife

## 2022-11-01 ENCOUNTER — Other Ambulatory Visit: Payer: Self-pay

## 2022-11-01 ENCOUNTER — Encounter: Payer: Self-pay | Admitting: Obstetrics and Gynecology

## 2022-11-01 ENCOUNTER — Observation Stay
Admission: EM | Admit: 2022-11-01 | Discharge: 2022-11-01 | Disposition: A | Discharge status: Home / Self Care | Payer: Medicaid Other | Attending: Obstetrics and Gynecology | Admitting: Obstetrics and Gynecology

## 2022-11-01 DIAGNOSIS — Z87891 Personal history of nicotine dependence: Secondary | ICD-10-CM | POA: Diagnosis not present

## 2022-11-01 DIAGNOSIS — U071 COVID-19: Secondary | ICD-10-CM | POA: Insufficient documentation

## 2022-11-01 DIAGNOSIS — R109 Unspecified abdominal pain: Secondary | ICD-10-CM | POA: Diagnosis present

## 2022-11-01 DIAGNOSIS — Z3A31 31 weeks gestation of pregnancy: Secondary | ICD-10-CM | POA: Diagnosis not present

## 2022-11-01 DIAGNOSIS — O10013 Pre-existing essential hypertension complicating pregnancy, third trimester: Secondary | ICD-10-CM | POA: Diagnosis not present

## 2022-11-01 DIAGNOSIS — O98513 Other viral diseases complicating pregnancy, third trimester: Secondary | ICD-10-CM | POA: Diagnosis present

## 2022-11-01 LAB — URINALYSIS, COMPLETE (UACMP) WITH MICROSCOPIC
Bilirubin Urine: NEGATIVE
Glucose, UA: 150 mg/dL — AB
Hgb urine dipstick: NEGATIVE
Ketones, ur: 80 mg/dL — AB
Nitrite: NEGATIVE
Protein, ur: NEGATIVE mg/dL
Specific Gravity, Urine: 1.011 (ref 1.005–1.030)
pH: 5 (ref 5.0–8.0)

## 2022-11-01 LAB — RESP PANEL BY RT-PCR (RSV, FLU A&B, COVID)  RVPGX2
Influenza A by PCR: NEGATIVE
Influenza B by PCR: NEGATIVE
Resp Syncytial Virus by PCR: NEGATIVE
SARS Coronavirus 2 by RT PCR: POSITIVE — AB

## 2022-11-01 MED ORDER — LACTATED RINGERS IV BOLUS
1000.0000 mL | Freq: Once | INTRAVENOUS | Status: AC
  Administered 2022-11-01: 1000 mL via INTRAVENOUS

## 2022-11-01 MED ORDER — ONDANSETRON HCL 4 MG/2ML IJ SOLN: 4.0000 mg | Freq: Four times a day (QID) | INTRAMUSCULAR | Status: DC | PRN

## 2022-11-01 MED ORDER — NIRMATRELVIR/RITONAVIR (PAXLOVID)TABLET: 3.0000 | ORAL_TABLET | Freq: Two times a day (BID) | ORAL | 0 refills | Status: AC

## 2022-11-01 MED ORDER — ONDANSETRON HCL 4 MG/2ML IJ SOLN
INTRAMUSCULAR | Status: AC
  Administered 2022-11-01: 4 mg via INTRAVENOUS
  Filled 2022-11-01: qty 2

## 2022-11-01 MED ORDER — LACTATED RINGERS IV SOLN: INTRAVENOUS | Status: DC

## 2022-11-01 MED ORDER — ACETAMINOPHEN 325 MG PO TABS: 650.0000 mg | ORAL_TABLET | ORAL | Status: DC | PRN

## 2022-11-01 NOTE — Discharge Summary (Signed)
Patient ID: Stacy Soto MRN: [id number] DOB/AGE: 10-19-2000 22 y.o.  Admit date: 11/01/2022 Discharge date: 11/01/2022  Admission Diagnoses: 22yo G2P1 at [redacted]w[redacted]d presents with N/V/D for a couple of days along with a cough.  Today she reports the start of sharp epigastric pain.  Denies UCs, cramping, LOF, or VB.    Discharge Diagnoses: Covid positive  Factors complicating pregnancy: Chronic HTN  BMI 40 or more - Obesity Class III  GDM (GDM in G1 as well) Rubella and Varicella Non Immune Heart Murmur Depression and Anxiety Vitamin D and B12 def  Prenatal Procedures: NST  Consults: None  Significant Diagnostic Studies:  Results for orders placed or performed during the hospital encounter of 11/01/22 (from the past 168 hour(s))  Urinalysis, Complete w Microscopic -Urine, Clean Catch   Collection Time: 11/01/22  7:03 PM  Result Value Ref Range   Color, Urine YELLOW (A) YELLOW   APPearance CLEAR (A) CLEAR   Specific Gravity, Urine 1.011 1.005 - 1.030   pH 5.0 5.0 - 8.0   Glucose, UA 150 (A) NEGATIVE mg/dL   Hgb urine dipstick NEGATIVE NEGATIVE   Bilirubin Urine NEGATIVE NEGATIVE   Ketones, ur 80 (A) NEGATIVE mg/dL   Protein, ur NEGATIVE NEGATIVE mg/dL   Nitrite NEGATIVE NEGATIVE   Leukocytes,Ua TRACE (A) NEGATIVE   RBC / HPF 0-5 0 - 5 RBC/hpf   WBC, UA 0-5 0 - 5 WBC/hpf   Bacteria, UA RARE (A) NONE SEEN   Squamous Epithelial / HPF 0-5 0 - 5 /HPF   Mucus PRESENT     Treatments: IV hydration and antiemetics  Hospital Course:  This is a 22 y.o. G2P1001 with IUP at [redacted]w[redacted]d seen for epigastric pain after a couple of days of N/V/D.  No leaking of fluid and no bleeding.  IVF bolus and Zofran given - pt reports symptom improvement. UA did not show any thing significant however, Covid was positive.  NST reactive.  She was observed, fetal heart rate monitoring remained reassuring, and she had no signs/symptoms of labor or other maternal-fetal concerns.  She was deemed stable for discharge  to home with outpatient follow up.  Discharge Physical Exam:  BP 128/66   Pulse (!) 116   Temp 99.1 F (37.3 C) (Oral)   Resp 18   Ht 5\' 5"  (1.651 m)   Wt 117.9 kg   LMP 02/18/2022   BMI 43.27 kg/m   General: NAD CV: RRR Pulm: nl effort ABD: s/nd/nt, gravid DVT Evaluation: LE non-ttp, no evidence of DVT on exam.  NST: FHR baseline: 145 bpm Variability: moderate Accelerations: yes Decelerations: none Category/reactivity: reactive   TOCO: quiet SVE: deferred      Discharge Condition: Stable  Disposition:  Discharge disposition: 01-Home or Self Care        Allergies as of 11/01/2022   No Known Allergies      Medication List     STOP taking these medications    metoCLOPramide 10 MG tablet Commonly known as: REGLAN   ondansetron 4 MG tablet Commonly known as: Zofran       TAKE these medications    acetaminophen 325 MG tablet Commonly known as: TYLENOL Take 325 mg by mouth as needed.   aspirin 81 MG chewable tablet Chew 81 mg by mouth daily.   escitalopram 10 MG tablet Commonly known as: LEXAPRO Take 10 mg by mouth daily.   labetalol 100 MG tablet Commonly known as: NORMODYNE Take 100 mg by mouth 2 (two) times daily.  NIFEdipine 90 MG 24 hr tablet Commonly known as: ADALAT CC Take 90 mg by mouth daily.   prenatal multivitamin Tabs tablet Take 1 tablet by mouth daily at 12 noon.   promethazine 25 MG tablet Commonly known as: PHENERGAN Take 25 mg by mouth every 6 (six) hours as needed for nausea or vomiting.        Follow-up Information     [facility] Follow up.   Why: Keep all scheduled appointments Contact information: [address] [place] [address] [phone]                Signed:  Quillian Quince 11/01/2022 9:52 PM

## 2022-11-01 NOTE — Progress Notes (Signed)
Pt reports to L/D triage with reported N/V, sharp upper abd pain, congestion, shortness of breath, and a productive cough. Pt reports not being able to keep down any PO fluids. She reports feeling sick for the last two days. Pt is afebrile. No bleeding or LOF and positive fetal movement. Monitors applied and assessing. Initial FHT 175.  Pt receiving IV fluids and zofran. Results pending.

## 2022-11-03 ENCOUNTER — Emergency Department: Payer: Medicaid Other

## 2022-11-03 ENCOUNTER — Emergency Department
Admission: EM | Admit: 2022-11-03 | Discharge: 2022-11-03 | Disposition: A | Discharge status: Home / Self Care | Payer: Medicaid Other | Attending: Student in an Organized Health Care Education/Training Program | Admitting: Student in an Organized Health Care Education/Training Program

## 2022-11-03 ENCOUNTER — Other Ambulatory Visit: Payer: Self-pay

## 2022-11-03 DIAGNOSIS — Z3A31 31 weeks gestation of pregnancy: Secondary | ICD-10-CM | POA: Diagnosis not present

## 2022-11-03 DIAGNOSIS — O26893 Other specified pregnancy related conditions, third trimester: Secondary | ICD-10-CM | POA: Insufficient documentation

## 2022-11-03 DIAGNOSIS — R0602 Shortness of breath: Secondary | ICD-10-CM | POA: Diagnosis not present

## 2022-11-03 DIAGNOSIS — R051 Acute cough: Secondary | ICD-10-CM | POA: Diagnosis not present

## 2022-11-03 LAB — CBC
HCT: 36.5 % (ref 36.0–46.0)
Hemoglobin: 12.1 g/dL (ref 12.0–15.0)
MCH: 28.5 pg (ref 26.0–34.0)
MCHC: 33.2 g/dL (ref 30.0–36.0)
MCV: 86.1 fL (ref 80.0–100.0)
Platelets: 186 10*3/uL (ref 150–400)
RBC: 4.24 MIL/uL (ref 3.87–5.11)
RDW: 13.4 % (ref 11.5–15.5)
WBC: 4.7 10*3/uL (ref 4.0–10.5)
nRBC: 0 % (ref 0.0–0.2)

## 2022-11-03 LAB — BASIC METABOLIC PANEL
Anion gap: 13 (ref 5–15)
BUN: 5 mg/dL — ABNORMAL LOW (ref 6–20)
CO2: 17 mmol/L — ABNORMAL LOW (ref 22–32)
Calcium: 9 mg/dL (ref 8.9–10.3)
Chloride: 104 mmol/L (ref 98–111)
Creatinine, Ser: 0.62 mg/dL (ref 0.44–1.00)
GFR, Estimated: >60 mL/min (ref >60)
Glucose, Bld: 249 mg/dL — ABNORMAL HIGH (ref 70–99)
Potassium: 3.7 mmol/L (ref 3.5–5.1)
Sodium: 134 mmol/L — ABNORMAL LOW (ref 135–145)

## 2022-11-03 MED ORDER — SODIUM CHLORIDE 0.9 % IV BOLUS
1000.0000 mL | Freq: Once | INTRAVENOUS | Status: AC
  Administered 2022-11-03: 1000 mL via INTRAVENOUS

## 2022-11-03 NOTE — ED Provider Notes (Signed)
[place] EMERGENCY DEPARTMENT AT [facility] Provider Note   CSN: [id number] Arrival date & time: 11/03/22  1549     History  Chief Complaint  Patient presents with   Shortness of Breath    Stacy Soto is a 22 y.o. female [redacted] wks pregnant.  History of hypertension and gestational diabetes presents to the emergency department for evaluation of cough, shortness of breath.  Patient was diagnosed with COVID on 11/01/2022, symptoms consisted of significant nausea vomiting and diarrhea as well as a cough.  This was the day of symptom onset, patient was seen here in the hospital and evaluated by obstetrician, was given 2 L of IV fluids and discharged home.  Patient's nausea vomiting and diarrhea has resolved.  Patient states her heart rate has been elevated over the last couple weeks.  Heart rates been in the 130s to 150s but she gets relief with rest.  Today she was out getting some fresh air going for a walk and noticed her heart rate was in the 150s and after sitting for 30 minutes could not get relief.  She describes sharp chest pain with taking a deep breath and cough.  Her cough is dry nonproductive.  She denies any fevers.  She has chest pain with taking a deep breath as well as with touching the chest wall.  No swelling or edema in the lower extremities.  Normal fetal movement.  No vaginal bleeding or discharge.     HPI     Home Medications Prior to Admission medications   Medication Sig Start Date End Date Taking? Authorizing Provider  nirmatrelvir/ritonavir (PAXLOVID) 20 x 150 MG & 10 x 100MG  TABS Take 3 tablets by mouth 2 (two) times daily for 5 days. Take nirmatrelvir (150 mg) two tablets twice daily for 5 days and ritonavir (100 mg) one tablet twice daily for 5 days. 11/01/22 11/06/22  Haroldine Laws, CNM  acetaminophen (TYLENOL) 325 MG tablet Take 325 mg by mouth as needed.    [provider]  aspirin 81 MG chewable tablet Chew 81 mg by mouth daily.    [provider]  escitalopram (LEXAPRO) 10 MG tablet Take 10 mg by mouth daily.    [provider]  labetalol (NORMODYNE) 100 MG tablet Take 100 mg by mouth 2 (two) times daily.    [provider]  NIFEdipine (ADALAT CC) 90 MG 24 hr tablet Take 90 mg by mouth daily.    [provider]  Prenatal Vit-Fe Fumarate-FA (PRENATAL MULTIVITAMIN) TABS tablet Take 1 tablet by mouth daily at 12 noon.    [provider]  promethazine (PHENERGAN) 25 MG tablet Take 25 mg by mouth every 6 (six) hours as needed for nausea or vomiting.    [provider]      Allergies    Patient has no known allergies.    Review of Systems   Review of Systems  Physical Exam Updated Vital Signs BP (!) 141/85 (BP Location: Right Arm)   Pulse (!) 107   Temp 98.2 F (36.8 C) (Oral)   Resp 18   Ht 5\' 5"  (1.651 m)   Wt 120.2 kg   LMP 02/18/2022   SpO2 99%   BMI 44.10 kg/m  Physical Exam Constitutional:      Appearance: She is well-developed.  HENT:     Head: Normocephalic and atraumatic.     Right Ear: External ear normal.     Left Ear: External ear normal.  Nose: No congestion or rhinorrhea.     Mouth/Throat:     Pharynx: Oropharynx is clear. No oropharyngeal exudate or posterior oropharyngeal erythema.  Eyes:     Conjunctiva/sclera: Conjunctivae normal.  Cardiovascular:     Rate and Rhythm: Normal rate.  Pulmonary:     Effort: Pulmonary effort is normal. No respiratory distress.     Breath sounds: Normal breath sounds. No stridor. No rales.  Chest:     Chest wall: Tenderness present.  Abdominal:     General: Abdomen is flat. There is no distension.     Tenderness: There is no abdominal tenderness. There is no guarding.  Musculoskeletal:        General: No swelling or tenderness. Normal range of motion.     Cervical back: Normal range of motion.     Right lower leg: No edema.     Left lower leg: No edema.  Skin:    General: Skin is warm.     Findings:  No rash.  Neurological:     General: No focal deficit present.     Mental Status: She is alert and oriented to person, place, and time. Mental status is at baseline.  Psychiatric:        Mood and Affect: Mood normal.        Behavior: Behavior normal.        Thought Content: Thought content normal.     ED Results / Procedures / Treatments   Labs (all labs ordered are listed, but only abnormal results are displayed) Labs Reviewed  BASIC METABOLIC PANEL - Abnormal; Notable for the following components:      Result Value   Sodium 134 (*)    CO2 17 (*)    Glucose, Bld 249 (*)    BUN 5 (*)    All other components within normal limits  CBC    EKG None  Radiology US Venous Img Lower Bilateral (DVT)  Result Date: 11/03/2022 CLINICAL DATA:  10026 Shortness of breath 10026 [id number] [redacted] weeks gestation of pregnancy 620473 EXAM: BILATERAL LOWER EXTREMITY VENOUS DOPPLER ULTRASOUND TECHNIQUE: Gray-scale sonography with graded compression, as well as color Doppler and duplex ultrasound were performed to evaluate the lower extremity deep venous systems from the level of the common femoral vein and including the common femoral, femoral, profunda femoral, popliteal and calf veins including the posterior tibial, peroneal and gastrocnemius veins when visible. The superficial great saphenous vein was also interrogated. Spectral Doppler was utilized to evaluate flow at rest and with distal augmentation maneuvers in the common femoral, femoral and popliteal veins. COMPARISON:  08/24/2018 FINDINGS: RIGHT LOWER EXTREMITY Common Femoral Vein: No evidence of thrombus. Normal compressibility, respiratory phasicity and response to augmentation. Saphenofemoral Junction: No evidence of thrombus. Normal compressibility and flow on color Doppler imaging. Profunda Femoral Vein: No evidence of thrombus. Normal compressibility and flow on color Doppler imaging. Femoral Vein: No evidence of thrombus. Normal compressibility,  respiratory phasicity and response to augmentation. Popliteal Vein: No evidence of thrombus. Normal compressibility, respiratory phasicity and response to augmentation. Calf Veins: No evidence of thrombus. Normal compressibility and flow on color Doppler imaging. Superficial Great Saphenous Vein: No evidence of thrombus. Normal compressibility. Venous Reflux:  None. Other Findings:  None. LEFT LOWER EXTREMITY Common Femoral Vein: No evidence of thrombus. Normal compressibility, respiratory phasicity and response to augmentation. Saphenofemoral Junction: No evidence of thrombus. Normal compressibility and flow on color Doppler imaging. Profunda Femoral Vein: No evidence of thrombus. Normal compressibility and flow on color  Doppler imaging. Femoral Vein: No evidence of thrombus. Normal compressibility, respiratory phasicity and response to augmentation. Popliteal Vein: No evidence of thrombus. Normal compressibility, respiratory phasicity and response to augmentation. Calf Veins: No evidence of thrombus. Normal compressibility and flow on color Doppler imaging. Superficial Great Saphenous Vein: No evidence of thrombus. Normal compressibility. Venous Reflux:  None. Other Findings:  None. IMPRESSION: No evidence of deep venous thrombosis in either lower extremity. Electronically Signed   By: Duanne Guess D.O.   On: 11/03/2022 17:35   DG Chest 2 View  Result Date: 11/03/2022 CLINICAL DATA:  Shortness of breath. Recently tested positive for COVID 19. EXAM: CHEST - 2 VIEW COMPARISON:  Radiographs 03/12/2019 and 05/06/2018.  CT 08/15/2018. FINDINGS: The heart size and mediastinal contours are normal. The lungs are clear. There is no pleural effusion or pneumothorax. No acute osseous findings are identified. IMPRESSION: Stable chest.  No evidence of active cardiopulmonary process. Electronically Signed   By: Carey Bullocks M.D.   On: 11/03/2022 16:23    Procedures Procedures    Medications Ordered in  ED Medications  sodium chloride 0.9 % bolus 1,000 mL (0 mLs Intravenous Stopped 11/03/22 1900)    ED Course/ Medical Decision Making/ A&P                                 Medical Decision Making Amount and/or Complexity of Data Reviewed Labs: ordered. Radiology: ordered.   22 year old female who is [redacted] weeks pregnant presents with cough, chest pain shortness of breath.  Patient with elevated heart rate, she was diagnosed with COVID a couple of days ago and had similar symptoms but was given 2 L of fluids and symptoms resolved.  Patient was given 1 L of fluids here in the ED and heart rate down to 105.  Patient with negative chest x-ray and normal EKG and blood work.  Ultrasound of both lower extremities negative to rule out for DVT.  She has no swelling or edema in lower extremities.  Patient's symptoms completely resolved after 1 L of fluids.  She had normal fetal heart tones and normal fetal movement with no vaginal bleeding or discharge.  Discussed case with OB on-call, felt that the patient was medically stable for discharge.  OB on-call was made aware that patient medically stable and normal fetal heart tones and normal fetal movement, they are okay with patient discharging home.  They were given strict return precautions. Final Clinical Impression(s) / ED Diagnoses Final diagnoses:  [redacted] weeks gestation of pregnancy  SOB (shortness of breath)  Acute cough    Rx / DC Orders ED Discharge Orders     None         Ronnette Juniper 11/03/22 1959    Willy Eddy, MD 11/04/22 [phone]

## 2022-11-03 NOTE — ED Triage Notes (Signed)
Pt sts that she was dx with COVID on 08/06 and since than she has been SOB. Pt sts that she is [redacted] weeks pregnant. Pt is G2P1A0

## 2022-11-03 NOTE — Discharge Instructions (Signed)
Please continue with Tylenol as needed.  Make sure you are drinking lots of fluids.  Rest and try to avoid overexertion.  Return to the ER for any shortness of breath, chest pain or difficulty breathing.  Also return for any decreased fetal movement.

## 2022-11-03 NOTE — ED Notes (Signed)
Provided pt with discharge instructions and education. All of pt questions answered. Pt in possession of all belongings. Pt AAOX4 and stable at time of discharge.Pt ambulated w/ steady gait towards ED exit.

## 2022-11-07 ENCOUNTER — Other Ambulatory Visit: Payer: Self-pay

## 2022-11-07 ENCOUNTER — Ambulatory Visit: Payer: Medicaid Other | Attending: Obstetrics and Gynecology

## 2022-11-07 DIAGNOSIS — O99213 Obesity complicating pregnancy, third trimester: Secondary | ICD-10-CM | POA: Insufficient documentation

## 2022-11-07 DIAGNOSIS — O24410 Gestational diabetes mellitus in pregnancy, diet controlled: Secondary | ICD-10-CM | POA: Diagnosis not present

## 2022-11-07 DIAGNOSIS — O10912 Unspecified pre-existing hypertension complicating pregnancy, second trimester: Secondary | ICD-10-CM | POA: Diagnosis present

## 2022-11-07 DIAGNOSIS — Z3A32 32 weeks gestation of pregnancy: Secondary | ICD-10-CM | POA: Insufficient documentation

## 2022-11-07 DIAGNOSIS — E669 Obesity, unspecified: Secondary | ICD-10-CM | POA: Diagnosis not present

## 2022-11-07 DIAGNOSIS — O24415 Gestational diabetes mellitus in pregnancy, controlled by oral hypoglycemic drugs: Secondary | ICD-10-CM | POA: Diagnosis present

## 2022-11-07 DIAGNOSIS — O10013 Pre-existing essential hypertension complicating pregnancy, third trimester: Secondary | ICD-10-CM | POA: Diagnosis not present

## 2022-11-09 ENCOUNTER — Other Ambulatory Visit: Payer: Self-pay

## 2022-11-09 DIAGNOSIS — O10912 Unspecified pre-existing hypertension complicating pregnancy, second trimester: Secondary | ICD-10-CM

## 2022-11-09 DIAGNOSIS — O24415 Gestational diabetes mellitus in pregnancy, controlled by oral hypoglycemic drugs: Secondary | ICD-10-CM

## 2022-11-09 DIAGNOSIS — E669 Obesity, unspecified: Secondary | ICD-10-CM

## 2022-11-14 ENCOUNTER — Ambulatory Visit: Payer: Medicaid Other | Attending: Obstetrics

## 2022-11-14 ENCOUNTER — Other Ambulatory Visit: Payer: Self-pay

## 2022-11-14 DIAGNOSIS — O10913 Unspecified pre-existing hypertension complicating pregnancy, third trimester: Secondary | ICD-10-CM | POA: Diagnosis not present

## 2022-11-14 DIAGNOSIS — O10912 Unspecified pre-existing hypertension complicating pregnancy, second trimester: Secondary | ICD-10-CM

## 2022-11-14 DIAGNOSIS — E669 Obesity, unspecified: Secondary | ICD-10-CM

## 2022-11-14 DIAGNOSIS — O24419 Gestational diabetes mellitus in pregnancy, unspecified control: Secondary | ICD-10-CM | POA: Insufficient documentation

## 2022-11-14 DIAGNOSIS — O99213 Obesity complicating pregnancy, third trimester: Secondary | ICD-10-CM | POA: Diagnosis not present

## 2022-11-14 DIAGNOSIS — O24410 Gestational diabetes mellitus in pregnancy, diet controlled: Secondary | ICD-10-CM

## 2022-11-14 DIAGNOSIS — O10013 Pre-existing essential hypertension complicating pregnancy, third trimester: Secondary | ICD-10-CM

## 2022-11-14 DIAGNOSIS — Z3A33 33 weeks gestation of pregnancy: Secondary | ICD-10-CM | POA: Insufficient documentation

## 2022-11-14 DIAGNOSIS — O4443 Low lying placenta NOS or without hemorrhage, third trimester: Secondary | ICD-10-CM | POA: Diagnosis not present

## 2022-11-14 DIAGNOSIS — O24415 Gestational diabetes mellitus in pregnancy, controlled by oral hypoglycemic drugs: Secondary | ICD-10-CM

## 2022-11-21 ENCOUNTER — Other Ambulatory Visit: Payer: Self-pay

## 2022-11-21 ENCOUNTER — Ambulatory Visit: Payer: Medicaid Other | Attending: Obstetrics and Gynecology

## 2022-11-21 VITALS — BP 139/94 | HR 91 | Temp 97.4°F | Ht 65.0 in | Wt 267.0 lb

## 2022-11-21 DIAGNOSIS — O10912 Unspecified pre-existing hypertension complicating pregnancy, second trimester: Secondary | ICD-10-CM

## 2022-11-21 DIAGNOSIS — O24415 Gestational diabetes mellitus in pregnancy, controlled by oral hypoglycemic drugs: Secondary | ICD-10-CM | POA: Diagnosis not present

## 2022-11-21 DIAGNOSIS — O24410 Gestational diabetes mellitus in pregnancy, diet controlled: Secondary | ICD-10-CM

## 2022-11-21 DIAGNOSIS — O99213 Obesity complicating pregnancy, third trimester: Secondary | ICD-10-CM | POA: Insufficient documentation

## 2022-11-21 DIAGNOSIS — O10913 Unspecified pre-existing hypertension complicating pregnancy, third trimester: Secondary | ICD-10-CM | POA: Diagnosis present

## 2022-11-21 DIAGNOSIS — O4443 Low lying placenta NOS or without hemorrhage, third trimester: Secondary | ICD-10-CM

## 2022-11-21 DIAGNOSIS — Z3A34 34 weeks gestation of pregnancy: Secondary | ICD-10-CM | POA: Diagnosis not present

## 2022-11-21 DIAGNOSIS — E669 Obesity, unspecified: Secondary | ICD-10-CM | POA: Diagnosis not present

## 2022-11-21 DIAGNOSIS — O10013 Pre-existing essential hypertension complicating pregnancy, third trimester: Secondary | ICD-10-CM

## 2022-11-23 ENCOUNTER — Other Ambulatory Visit: Payer: Self-pay

## 2022-11-23 DIAGNOSIS — E669 Obesity, unspecified: Secondary | ICD-10-CM

## 2022-11-23 DIAGNOSIS — O10912 Unspecified pre-existing hypertension complicating pregnancy, second trimester: Secondary | ICD-10-CM

## 2022-11-23 DIAGNOSIS — O24415 Gestational diabetes mellitus in pregnancy, controlled by oral hypoglycemic drugs: Secondary | ICD-10-CM

## 2022-11-30 ENCOUNTER — Ambulatory Visit: Payer: Medicaid Other | Admitting: Obstetrics

## 2022-11-30 ENCOUNTER — Encounter: Payer: Self-pay | Admitting: Obstetrics and Gynecology

## 2022-11-30 ENCOUNTER — Observation Stay
Admission: RE | Admit: 2022-11-30 | Discharge: 2022-11-30 | Disposition: A | Discharge status: Home / Self Care | Payer: Medicaid Other | Attending: Obstetrics | Admitting: Obstetrics

## 2022-11-30 ENCOUNTER — Ambulatory Visit (HOSPITAL_BASED_OUTPATIENT_CLINIC_OR_DEPARTMENT_OTHER): Payer: Medicaid Other

## 2022-11-30 ENCOUNTER — Other Ambulatory Visit: Payer: Self-pay

## 2022-11-30 DIAGNOSIS — O4440 Low lying placenta NOS or without hemorrhage, unspecified trimester: Secondary | ICD-10-CM | POA: Insufficient documentation

## 2022-11-30 DIAGNOSIS — O10013 Pre-existing essential hypertension complicating pregnancy, third trimester: Secondary | ICD-10-CM

## 2022-11-30 DIAGNOSIS — Z7982 Long term (current) use of aspirin: Secondary | ICD-10-CM | POA: Insufficient documentation

## 2022-11-30 DIAGNOSIS — O24410 Gestational diabetes mellitus in pregnancy, diet controlled: Secondary | ICD-10-CM

## 2022-11-30 DIAGNOSIS — E669 Obesity, unspecified: Secondary | ICD-10-CM

## 2022-11-30 DIAGNOSIS — O4443 Low lying placenta NOS or without hemorrhage, third trimester: Secondary | ICD-10-CM | POA: Diagnosis not present

## 2022-11-30 DIAGNOSIS — Z3A35 35 weeks gestation of pregnancy: Secondary | ICD-10-CM

## 2022-11-30 DIAGNOSIS — O99213 Obesity complicating pregnancy, third trimester: Secondary | ICD-10-CM

## 2022-11-30 DIAGNOSIS — H43399 Other vitreous opacities, unspecified eye: Secondary | ICD-10-CM | POA: Diagnosis not present

## 2022-11-30 DIAGNOSIS — O10913 Unspecified pre-existing hypertension complicating pregnancy, third trimester: Secondary | ICD-10-CM | POA: Diagnosis present

## 2022-11-30 DIAGNOSIS — O24415 Gestational diabetes mellitus in pregnancy, controlled by oral hypoglycemic drugs: Secondary | ICD-10-CM

## 2022-11-30 DIAGNOSIS — O26893 Other specified pregnancy related conditions, third trimester: Secondary | ICD-10-CM | POA: Insufficient documentation

## 2022-11-30 DIAGNOSIS — O10912 Unspecified pre-existing hypertension complicating pregnancy, second trimester: Secondary | ICD-10-CM

## 2022-11-30 DIAGNOSIS — O133 Gestational [pregnancy-induced] hypertension without significant proteinuria, third trimester: Principal | ICD-10-CM | POA: Insufficient documentation

## 2022-11-30 DIAGNOSIS — R519 Headache, unspecified: Secondary | ICD-10-CM | POA: Insufficient documentation

## 2022-11-30 LAB — CBC WITH DIFFERENTIAL/PLATELET
Abs Immature Granulocytes: 0.04 10*3/uL (ref 0.00–0.07)
Basophils Absolute: 0 10*3/uL (ref 0.0–0.1)
Basophils Relative: 0 %
Eosinophils Absolute: 0 10*3/uL (ref 0.0–0.5)
Eosinophils Relative: 1 %
HCT: 35 % — ABNORMAL LOW (ref 36.0–46.0)
Hemoglobin: 11.7 g/dL — ABNORMAL LOW (ref 12.0–15.0)
Immature Granulocytes: 1 %
Lymphocytes Relative: 25 %
Lymphs Abs: 1.4 10*3/uL (ref 0.7–4.0)
MCH: 27.8 pg (ref 26.0–34.0)
MCHC: 33.4 g/dL (ref 30.0–36.0)
MCV: 83.1 fL (ref 80.0–100.0)
Monocytes Absolute: 0.4 10*3/uL (ref 0.1–1.0)
Monocytes Relative: 7 %
Neutro Abs: 3.9 10*3/uL (ref 1.7–7.7)
Neutrophils Relative %: 66 %
Platelets: 165 10*3/uL (ref 150–400)
RBC: 4.21 MIL/uL (ref 3.87–5.11)
RDW: 12.8 % (ref 11.5–15.5)
WBC: 5.8 10*3/uL (ref 4.0–10.5)
nRBC: 0 % (ref 0.0–0.2)

## 2022-11-30 LAB — PROTEIN / CREATININE RATIO, URINE
Creatinine, Urine: 73 mg/dL
Protein Creatinine Ratio: 0.25 mg/mg{creat} — ABNORMAL HIGH (ref 0.00–0.15)
Total Protein, Urine: 18 mg/dL

## 2022-11-30 LAB — COMPREHENSIVE METABOLIC PANEL
ALT: 8 U/L (ref 0–44)
AST: 14 U/L — ABNORMAL LOW (ref 15–41)
Albumin: 2.4 g/dL — ABNORMAL LOW (ref 3.5–5.0)
Alkaline Phosphatase: 106 U/L (ref 38–126)
Anion gap: 9 (ref 5–15)
BUN: 5 mg/dL — ABNORMAL LOW (ref 6–20)
CO2: 17 mmol/L — ABNORMAL LOW (ref 22–32)
Calcium: 8.3 mg/dL — ABNORMAL LOW (ref 8.9–10.3)
Chloride: 109 mmol/L (ref 98–111)
Creatinine, Ser: 0.37 mg/dL — ABNORMAL LOW (ref 0.44–1.00)
GFR, Estimated: >60 mL/min (ref >60)
Glucose, Bld: 120 mg/dL — ABNORMAL HIGH (ref 70–99)
Potassium: 3.9 mmol/L (ref 3.5–5.1)
Sodium: 135 mmol/L (ref 135–145)
Total Bilirubin: 0.4 mg/dL (ref 0.3–1.2)
Total Protein: 5.6 g/dL — ABNORMAL LOW (ref 6.5–8.1)

## 2022-11-30 MED ORDER — LABETALOL HCL 100 MG PO TABS: 300.0000 mg | ORAL_TABLET | Freq: Two times a day (BID) | ORAL | Status: DC

## 2022-11-30 MED ORDER — PROCHLORPERAZINE MALEATE 10 MG PO TABS
10.0000 mg | ORAL_TABLET | Freq: Once | ORAL | Status: AC
  Administered 2022-11-30: 10 mg via ORAL
  Filled 2022-11-30: qty 1

## 2022-11-30 MED ORDER — LABETALOL HCL 5 MG/ML IV SOLN: 40.0000 mg | INTRAVENOUS | Status: DC | PRN

## 2022-11-30 MED ORDER — ACETAMINOPHEN 500 MG PO TABS: 1000.0000 mg | ORAL_TABLET | Freq: Four times a day (QID) | ORAL | Status: DC | PRN

## 2022-11-30 MED ORDER — ACETAMINOPHEN 500 MG PO TABS
1000.0000 mg | ORAL_TABLET | Freq: Four times a day (QID) | ORAL | Status: DC | PRN
  Administered 2022-11-30: 1000 mg via ORAL
  Filled 2022-11-30: qty 2

## 2022-11-30 MED ORDER — HYDRALAZINE HCL 20 MG/ML IJ SOLN: 10.0000 mg | INTRAMUSCULAR | Status: DC | PRN

## 2022-11-30 MED ORDER — LABETALOL HCL 5 MG/ML IV SOLN: 20.0000 mg | INTRAVENOUS | Status: DC | PRN

## 2022-11-30 MED ORDER — LABETALOL HCL 5 MG/ML IV SOLN: 80.0000 mg | INTRAVENOUS | Status: DC | PRN

## 2022-11-30 MED ORDER — MAGNESIUM OXIDE -MG SUPPLEMENT 400 (240 MG) MG PO TABS
800.0000 mg | ORAL_TABLET | Freq: Once | ORAL | Status: AC
  Administered 2022-11-30: 800 mg via ORAL
  Filled 2022-11-30: qty 2

## 2022-11-30 MED ORDER — LABETALOL HCL 100 MG PO TABS
200.0000 mg | ORAL_TABLET | Freq: Once | ORAL | Status: AC
  Administered 2022-11-30: 200 mg via ORAL
  Filled 2022-11-30: qty 2

## 2022-11-30 MED ORDER — DIPHENHYDRAMINE HCL 25 MG PO CAPS
50.0000 mg | ORAL_CAPSULE | Freq: Once | ORAL | Status: AC
  Administered 2022-11-30: 50 mg via ORAL
  Filled 2022-11-30 (×2): qty 2

## 2022-11-30 MED ORDER — LABETALOL HCL 300 MG PO TABS: 300.0000 mg | ORAL_TABLET | Freq: Two times a day (BID) | ORAL | 0 refills | Status: DC

## 2022-11-30 NOTE — Discharge Summary (Signed)
Stacy Soto is a 22 y.o. female. She is at [redacted]w[redacted]d gestation. Patient's last menstrual period was 02/18/2022. Estimated Date of Delivery: 01/02/23  Prenatal care site: [facility]  Chief complaint: Elevated blood pressure w/headache and floaters  HPI: Stacy Soto presents to labor and delivery after being sent up from MFM for elevated blood pressure. Patient reports she has also been seeing floaters, has had a headache rated 7/10 on the pain scale that wont go away with tylenol, and has noticed increased swelling over the last couple of days.   Factors complicating pregnancy: Chronic HTN  BMI 40 or more - Obesity Class III  GDM (GDM in G1 as well) Rubella and Varicella Non Immune Heart Murmur Depression and Anxiety Vitamin D and B12 def  S: Resting comfortably. no CTX, no VB.no LOF,  Active fetal movement.  Maternal Medical History:  Past Medical Hx:  has a past medical history of Anxiety, Atypical chest pain, Depression, Gestational diabetes, Heart murmur, Hypertension, IBS (irritable bowel syndrome), Mitral regurgitation, Morbid obesity (HCC), Orthodontics, Palpitations, Pregnancy induced hypertension, and PVCs (premature ventricular contractions).    Past Surgical Hx:  has a past surgical history that includes Adenoidectomy (2005); Tonsillectomy (2005); Septoplasty (N/A, 06/28/2016); Nasal turbinate reduction (Left, 06/28/2016); and Tonsillectomy.   No Known Allergies   Prior to Admission medications   Medication Sig Start Date End Date Taking? Authorizing Provider  aspirin 81 MG chewable tablet Chew 81 mg by mouth daily.   Yes [provider]  cyanocobalamin (VITAMIN B12) 500 MCG tablet Take 500 mcg by mouth daily. Pt states they are injections every week at [facility].  Dose above might not be accurate to the injections.   Yes [provider]  escitalopram (LEXAPRO) 10 MG tablet Take 10 mg by mouth daily.   Yes [provider]  labetalol (NORMODYNE) 100 MG  tablet Take 100 mg by mouth 2 (two) times daily.   Yes [provider]  NIFEdipine (ADALAT CC) 90 MG 24 hr tablet Take 90 mg by mouth daily.   Yes [provider]  Prenatal Vit-Fe Fumarate-FA (PRENATAL MULTIVITAMIN) TABS tablet Take 1 tablet by mouth daily at 12 noon.   Yes [provider]  promethazine (PHENERGAN) 25 MG tablet Take 25 mg by mouth every 6 (six) hours as needed for nausea or vomiting.   Yes [provider]  acetaminophen (TYLENOL) 325 MG tablet Take 325 mg by mouth as needed.    [provider]    Social History: She  reports that she quit smoking about 3 years ago. Her smoking use included e-cigarettes. She has never used smokeless tobacco. She reports that she does not currently use alcohol. She reports that she does not use drugs.  Family History: family history includes Breast cancer in her paternal grandmother; Diabetes in her father and paternal grandfather; Other in her mother; Polycystic ovary syndrome in her mother. no history of gyn cancers  Review of Systems: A full review of systems was performed and negative except as noted in the HPI.    O:  BP 105/77   Pulse 93   Temp 97.9 F (36.6 C) (Oral)   Resp 16   Ht 5\' 5"  (1.651 m)   Wt 122.5 kg   LMP 02/18/2022   BMI 44.93 kg/m  Results for orders placed or performed during the hospital encounter of 11/30/22 (from the past 48 hour(s))  Protein / creatinine ratio, urine   Collection Time: 11/30/22 10:54 AM  Result Value Ref  Range   Creatinine, Urine 73 mg/dL   Total Protein, Urine 18 mg/dL   Protein Creatinine Ratio 0.25 (H) 0.00 - 0.15 mg/mg[Cre]  CBC with Differential/Platelet   Collection Time: 11/30/22 10:54 AM  Result Value Ref Range   WBC 5.8 4.0 - 10.5 K/uL   RBC 4.21 3.87 - 5.11 MIL/uL   Hemoglobin 11.7 (L) 12.0 - 15.0 g/dL   HCT 56.2 (L) 13.0 - 86.5 %   MCV 83.1 80.0 - 100.0 fL   MCH 27.8 26.0 - 34.0 pg   MCHC 33.4 30.0 - 36.0 g/dL   RDW 78.4 69.6 -  29.5 %   Platelets 165 150 - 400 K/uL   nRBC 0.0 0.0 - 0.2 %   Neutrophils Relative % 66 %   Neutro Abs 3.9 1.7 - 7.7 K/uL   Lymphocytes Relative 25 %   Lymphs Abs 1.4 0.7 - 4.0 K/uL   Monocytes Relative 7 %   Monocytes Absolute 0.4 0.1 - 1.0 K/uL   Eosinophils Relative 1 %   Eosinophils Absolute 0.0 0.0 - 0.5 K/uL   Basophils Relative 0 %   Basophils Absolute 0.0 0.0 - 0.1 K/uL   Immature Granulocytes 1 %   Abs Immature Granulocytes 0.04 0.00 - 0.07 K/uL  Comprehensive metabolic panel   Collection Time: 11/30/22 10:54 AM  Result Value Ref Range   Sodium 135 135 - 145 mmol/L   Potassium 3.9 3.5 - 5.1 mmol/L   Chloride 109 98 - 111 mmol/L   CO2 17 (L) 22 - 32 mmol/L   Glucose, Bld 120 (H) 70 - 99 mg/dL   BUN 5 (L) 6 - 20 mg/dL   Creatinine, Ser 2.84 (L) 0.44 - 1.00 mg/dL   Calcium 8.3 (L) 8.9 - 10.3 mg/dL   Total Protein 5.6 (L) 6.5 - 8.1 g/dL   Albumin 2.4 (L) 3.5 - 5.0 g/dL   AST 14 (L) 15 - 41 U/L   ALT 8 0 - 44 U/L   Alkaline Phosphatase 106 38 - 126 U/L   Total Bilirubin 0.4 0.3 - 1.2 mg/dL   GFR, Estimated >13 >24 mL/min   Anion gap 9 5 - 15     Constitutional: NAD, AAOx3  HE/ENT: extraocular movements grossly intact, moist mucous membranes CV: RRR PULM: nl respiratory effort, CTABL Abd: gravid, non-tender, non-distended, soft  Ext: Non-tender, Nonedmeatous Psych: mood appropriate, speech normal Pelvic : deferred SVE:     NST Fetal Monitor: Baseline: 130 bpm Variability: moderate Accels: Present Decels: none Toco: none  Category: I   Assessment: 22 y.o. [redacted]w[redacted]d here for antenatal surveillance during pregnancy.Patient presented w/headache 7/10 and mild range blood pressures. At the time of discharge, patient was noted sleeping and rated her headache 4/10.  Principle diagnosis:  CHTN in third trimester pregnancy  Plan: Labor: not present.  Fetal Wellbeing: Reassuring Cat 1 tracing. Reactive NST  PIH labs WNL Compazine, Benadryl, Tylenol, and  Magnesium oxide given for headache BP meds increased; now on 90 mg Procardia and 300 mg labetalol BID D/c home stable, precautions reviewed, follow-up as scheduled.   ----- Chari Manning, CNM Certified Nurse Midwife [facility]

## 2022-11-30 NOTE — OB Triage Note (Addendum)
Patient is a 22 yo, G2P1, at 35 weeks 2 days. Patient presents to labor and delivery after being sent up from MFM for elevated blood pressure. Patient reports she has also been seeing floaters, has had a headache rated 7/10 on the pain scale that wont go away with tylenol, and has noticed increased swelling over the last couple of days. Patient denies any vaginal bleeding or LOF. Patient reports +FM. Monitors applied and assessing. Initial BP 152/96, other vital signs are stable. Reflexes +2, one beat of clonus noted bilaterally. Initial fetal heart tone 130. Rubye Oaks CNM notified of patients arrival to unit. Plan to place in observation for pre-e workup and fetal monitoring. Bps currently cycling.

## 2022-11-30 NOTE — Progress Notes (Signed)
Pt had MFM appt this am.  C/O headache, feet swelling, BP elevated. Dr. Parke Poisson wanted [facility] to [facility] for [facility] work and monitoring.   Pt report called to [place] prior to transport.  Pt transported to OBS #2 via wheelchair @ 10:20 and secondary report given @ the nurse station that pt had arrived.

## 2022-11-30 NOTE — Progress Notes (Signed)
Discharge instructions provided to patient. Patient verbalized understanding. Pt educated on signs and symptoms of labor, vaginal bleeding, LOF, and fetal movement. Red flag signs reviewed by RN. Patient discharged home in stable condition. 

## 2022-11-30 NOTE — Progress Notes (Signed)
MFM Note  Stacy Soto is currently at 35 weeks and 2 days.  She was seen for a BPP due to chronic hypertension treated with both labetalol and nifedipine, diet controlled gestational diabetes, and maternal obesity with a BMI of 43.    Her blood pressure in our office today was 148/106.  She reports an increase in lower extremity swelling over the past week and has had a persistent headache for the past 1 to 2 days along with seeing floaters in front of her eyes.  A biophysical profile performed today was 8/8.  The AFI was 8.39 cm (within normal limits).    The fetus is in the vertex presentation.  Due to concerns regarding preeclampsia based on her elevated blood pressures and symptoms, the patient was sent to labor and delivery following today's ultrasound exam for evaluation.    She should have PIH labs drawn and a P/C ratio performed.  The implications of and management of preeclampsia was discussed with the patient.    She understands that delivery is the only treatment for preeclampsia.  Should she be diagnosed with preeclampsia, delivery is recommended at her current gestational age.    Should preeclampsia be ruled out, the patient will return to our office next week for a BPP and growth scan.  The patient understands that should she be sent home, she should continue to watch out for signs and symptoms of preeclampsia and return to the hospital if her symptoms persist.    She already has an induction of labor scheduled on December 18, 2022.    The patient stated that all of her questions were answered today.  A total of 20 minutes was spent counseling and coordinating the care for this patient.  Greater than 50% of the time was spent in direct face-to-face contact.

## 2022-12-02 ENCOUNTER — Other Ambulatory Visit: Payer: Self-pay

## 2022-12-02 ENCOUNTER — Inpatient Hospital Stay
Admission: EM | Admit: 2022-12-02 | Discharge: 2022-12-07 | DRG: 806 | Disposition: A | Discharge status: Home / Self Care | Payer: Medicaid Other | Attending: Certified Nurse Midwife | Admitting: Certified Nurse Midwife

## 2022-12-02 ENCOUNTER — Encounter: Payer: Self-pay | Admitting: Obstetrics and Gynecology

## 2022-12-02 DIAGNOSIS — Z3A35 35 weeks gestation of pregnancy: Secondary | ICD-10-CM | POA: Diagnosis not present

## 2022-12-02 DIAGNOSIS — O24420 Gestational diabetes mellitus in childbirth, diet controlled: Secondary | ICD-10-CM | POA: Diagnosis present

## 2022-12-02 DIAGNOSIS — O114 Pre-existing hypertension with pre-eclampsia, complicating childbirth: Principal | ICD-10-CM | POA: Diagnosis present

## 2022-12-02 DIAGNOSIS — O99344 Other mental disorders complicating childbirth: Secondary | ICD-10-CM | POA: Diagnosis present

## 2022-12-02 DIAGNOSIS — O1092 Unspecified pre-existing hypertension complicating childbirth: Secondary | ICD-10-CM | POA: Diagnosis present

## 2022-12-02 DIAGNOSIS — E559 Vitamin D deficiency, unspecified: Secondary | ICD-10-CM | POA: Diagnosis present

## 2022-12-02 DIAGNOSIS — O9081 Anemia of the puerperium: Secondary | ICD-10-CM | POA: Diagnosis not present

## 2022-12-02 DIAGNOSIS — D62 Acute posthemorrhagic anemia: Secondary | ICD-10-CM | POA: Diagnosis not present

## 2022-12-02 DIAGNOSIS — F419 Anxiety disorder, unspecified: Secondary | ICD-10-CM | POA: Diagnosis present

## 2022-12-02 DIAGNOSIS — Z2839 Other underimmunization status: Secondary | ICD-10-CM

## 2022-12-02 DIAGNOSIS — Z7982 Long term (current) use of aspirin: Secondary | ICD-10-CM

## 2022-12-02 DIAGNOSIS — O99214 Obesity complicating childbirth: Secondary | ICD-10-CM | POA: Diagnosis present

## 2022-12-02 DIAGNOSIS — Z833 Family history of diabetes mellitus: Secondary | ICD-10-CM | POA: Diagnosis not present

## 2022-12-02 DIAGNOSIS — O3660X0 Maternal care for excessive fetal growth, unspecified trimester, not applicable or unspecified: Secondary | ICD-10-CM | POA: Diagnosis present

## 2022-12-02 DIAGNOSIS — Z87891 Personal history of nicotine dependence: Secondary | ICD-10-CM | POA: Diagnosis not present

## 2022-12-02 DIAGNOSIS — F32A Depression, unspecified: Secondary | ICD-10-CM | POA: Diagnosis present

## 2022-12-02 DIAGNOSIS — E538 Deficiency of other specified B group vitamins: Secondary | ICD-10-CM | POA: Diagnosis present

## 2022-12-02 DIAGNOSIS — O10919 Unspecified pre-existing hypertension complicating pregnancy, unspecified trimester: Principal | ICD-10-CM | POA: Diagnosis present

## 2022-12-02 DIAGNOSIS — O119 Pre-existing hypertension with pre-eclampsia, unspecified trimester: Secondary | ICD-10-CM | POA: Diagnosis present

## 2022-12-02 DIAGNOSIS — O24419 Gestational diabetes mellitus in pregnancy, unspecified control: Secondary | ICD-10-CM | POA: Diagnosis present

## 2022-12-02 DIAGNOSIS — O09899 Supervision of other high risk pregnancies, unspecified trimester: Secondary | ICD-10-CM

## 2022-12-02 LAB — COMPREHENSIVE METABOLIC PANEL
ALT: 9 U/L (ref 0–44)
AST: 14 U/L — ABNORMAL LOW (ref 15–41)
Albumin: 2.7 g/dL — ABNORMAL LOW (ref 3.5–5.0)
Alkaline Phosphatase: 124 U/L (ref 38–126)
Anion gap: 12 (ref 5–15)
BUN: 6 mg/dL (ref 6–20)
CO2: 17 mmol/L — ABNORMAL LOW (ref 22–32)
Calcium: 8.6 mg/dL — ABNORMAL LOW (ref 8.9–10.3)
Chloride: 105 mmol/L (ref 98–111)
Creatinine, Ser: 0.4 mg/dL — ABNORMAL LOW (ref 0.44–1.00)
GFR, Estimated: >60 mL/min (ref >60)
Glucose, Bld: 90 mg/dL (ref 70–99)
Potassium: 3.9 mmol/L (ref 3.5–5.1)
Sodium: 134 mmol/L — ABNORMAL LOW (ref 135–145)
Total Bilirubin: 0.7 mg/dL (ref 0.3–1.2)
Total Protein: 6.3 g/dL — ABNORMAL LOW (ref 6.5–8.1)

## 2022-12-02 LAB — CBC
HCT: 34.5 % — ABNORMAL LOW (ref 36.0–46.0)
Hemoglobin: 11.9 g/dL — ABNORMAL LOW (ref 12.0–15.0)
MCH: 28.2 pg (ref 26.0–34.0)
MCHC: 34.5 g/dL (ref 30.0–36.0)
MCV: 81.8 fL (ref 80.0–100.0)
Platelets: 177 10*3/uL (ref 150–400)
RBC: 4.22 MIL/uL (ref 3.87–5.11)
RDW: 12.9 % (ref 11.5–15.5)
WBC: 5.8 10*3/uL (ref 4.0–10.5)
nRBC: 0 % (ref 0.0–0.2)

## 2022-12-02 LAB — PROTEIN / CREATININE RATIO, URINE
Creatinine, Urine: 88 mg/dL
Protein Creatinine Ratio: 0.26 mg/mg{creat} — ABNORMAL HIGH (ref 0.00–0.15)
Total Protein, Urine: 23 mg/dL

## 2022-12-02 LAB — TYPE AND SCREEN
ABO/RH(D): A POS
Antibody Screen: NEGATIVE

## 2022-12-02 LAB — RAPID HIV SCREEN (HIV 1/2 AB+AG)
HIV 1/2 Antibodies: NONREACTIVE
HIV-1 P24 Antigen - HIV24: NONREACTIVE

## 2022-12-02 LAB — GROUP B STREP BY PCR: Group B strep by PCR: NEGATIVE

## 2022-12-02 MED ORDER — LABETALOL HCL 5 MG/ML IV SOLN
20.0000 mg | INTRAVENOUS | Status: DC | PRN
  Administered 2022-12-02: 20 mg via INTRAVENOUS

## 2022-12-02 MED ORDER — LACTATED RINGERS IV SOLN: INTRAVENOUS | Status: DC

## 2022-12-02 MED ORDER — LIDOCAINE HCL (PF) 1 % IJ SOLN: 30.0000 mL | INTRAMUSCULAR | Status: DC | PRN

## 2022-12-02 MED ORDER — MAGNESIUM SULFATE BOLUS VIA INFUSION
4.0000 g | Freq: Once | INTRAVENOUS | Status: AC
  Administered 2022-12-02: 4 g via INTRAVENOUS
  Filled 2022-12-02: qty 1000

## 2022-12-02 MED ORDER — TERBUTALINE SULFATE 1 MG/ML IJ SOLN: 0.2500 mg | Freq: Once | INTRAMUSCULAR | Status: DC | PRN

## 2022-12-02 MED ORDER — LACTATED RINGERS IV BOLUS
1000.0000 mL | Freq: Once | INTRAVENOUS | Status: AC
  Administered 2022-12-02: 1000 mL via INTRAVENOUS

## 2022-12-02 MED ORDER — OXYTOCIN-SODIUM CHLORIDE 30-0.9 UT/500ML-% IV SOLN
INTRAVENOUS | Status: AC
  Filled 2022-12-02: qty 500

## 2022-12-02 MED ORDER — LABETALOL HCL 5 MG/ML IV SOLN: 80.0000 mg | INTRAVENOUS | Status: DC | PRN

## 2022-12-02 MED ORDER — MISOPROSTOL 200 MCG PO TABS
ORAL_TABLET | ORAL | Status: AC
  Filled 2022-12-02: qty 4

## 2022-12-02 MED ORDER — MAGNESIUM OXIDE -MG SUPPLEMENT 400 (240 MG) MG PO TABS
800.0000 mg | ORAL_TABLET | Freq: Once | ORAL | Status: AC
  Administered 2022-12-02: 800 mg via ORAL
  Filled 2022-12-02: qty 2

## 2022-12-02 MED ORDER — MISOPROSTOL 25 MCG QUARTER TABLET
25.0000 ug | ORAL_TABLET | ORAL | Status: AC
  Administered 2022-12-02 – 2022-12-03 (×3): 25 ug via VAGINAL
  Filled 2022-12-02 (×3): qty 1

## 2022-12-02 MED ORDER — LIDOCAINE HCL (PF) 1 % IJ SOLN
INTRAMUSCULAR | Status: AC
  Filled 2022-12-02: qty 30

## 2022-12-02 MED ORDER — ONDANSETRON HCL 4 MG/2ML IJ SOLN
4.0000 mg | Freq: Four times a day (QID) | INTRAMUSCULAR | Status: DC | PRN
  Administered 2022-12-03 (×2): 4 mg via INTRAVENOUS
  Filled 2022-12-02 (×2): qty 2

## 2022-12-02 MED ORDER — LACTATED RINGERS IV SOLN: 125.0000 mL/h | INTRAVENOUS | Status: DC

## 2022-12-02 MED ORDER — SODIUM CHLORIDE 0.9% FLUSH: 3.0000 mL | INTRAVENOUS | Status: DC | PRN

## 2022-12-02 MED ORDER — MISOPROSTOL 25 MCG QUARTER TABLET
25.0000 ug | ORAL_TABLET | ORAL | Status: AC
  Administered 2022-12-02 – 2022-12-03 (×3): 25 ug via ORAL
  Filled 2022-12-02 (×3): qty 1

## 2022-12-02 MED ORDER — LABETALOL HCL 5 MG/ML IV SOLN: 40.0000 mg | INTRAVENOUS | Status: DC | PRN

## 2022-12-02 MED ORDER — SODIUM CHLORIDE 0.9% FLUSH: 3.0000 mL | Freq: Two times a day (BID) | INTRAVENOUS | Status: DC

## 2022-12-02 MED ORDER — ACETAMINOPHEN 500 MG PO TABS
1000.0000 mg | ORAL_TABLET | Freq: Four times a day (QID) | ORAL | Status: DC | PRN
  Administered 2022-12-02 – 2022-12-03 (×2): 1000 mg via ORAL
  Filled 2022-12-02 (×2): qty 2

## 2022-12-02 MED ORDER — BETAMETHASONE SOD PHOS & ACET 6 (3-3) MG/ML IJ SUSP
12.0000 mg | INTRAMUSCULAR | Status: DC
  Administered 2022-12-02: 12 mg via INTRAMUSCULAR
  Filled 2022-12-02: qty 5

## 2022-12-02 MED ORDER — HYDRALAZINE HCL 20 MG/ML IJ SOLN: 10.0000 mg | INTRAMUSCULAR | Status: DC | PRN

## 2022-12-02 MED ORDER — OXYTOCIN BOLUS FROM INFUSION: 333.0000 mL | Freq: Once | INTRAVENOUS | Status: DC

## 2022-12-02 MED ORDER — SOD CITRATE-CITRIC ACID 500-334 MG/5ML PO SOLN: 30.0000 mL | ORAL | Status: DC | PRN

## 2022-12-02 MED ORDER — OXYTOCIN-SODIUM CHLORIDE 30-0.9 UT/500ML-% IV SOLN: 2.5000 [IU]/h | INTRAVENOUS | Status: DC

## 2022-12-02 MED ORDER — SODIUM CHLORIDE 0.9 % IV SOLN: 250.0000 mL | INTRAVENOUS | Status: DC | PRN

## 2022-12-02 MED ORDER — CALCIUM CARBONATE ANTACID 500 MG PO CHEW
2.0000 | CHEWABLE_TABLET | ORAL | Status: DC | PRN
  Administered 2022-12-03: 400 mg via ORAL
  Filled 2022-12-02: qty 2

## 2022-12-02 MED ORDER — OXYTOCIN-SODIUM CHLORIDE 30-0.9 UT/500ML-% IV SOLN
1.0000 m[IU]/min | INTRAVENOUS | Status: DC
  Administered 2022-12-03: 2 m[IU]/min via INTRAVENOUS
  Filled 2022-12-02: qty 500

## 2022-12-02 MED ORDER — LABETALOL HCL 5 MG/ML IV SOLN
INTRAVENOUS | Status: AC
  Filled 2022-12-02: qty 4

## 2022-12-02 MED ORDER — AMMONIA AROMATIC IN INHA
RESPIRATORY_TRACT | Status: AC
  Filled 2022-12-02: qty 10

## 2022-12-02 MED ORDER — OXYTOCIN 10 UNIT/ML IJ SOLN
INTRAMUSCULAR | Status: AC
  Filled 2022-12-02: qty 2

## 2022-12-02 MED ORDER — DIPHENHYDRAMINE HCL 25 MG PO CAPS
25.0000 mg | ORAL_CAPSULE | Freq: Once | ORAL | Status: AC
  Administered 2022-12-02: 25 mg via ORAL
  Filled 2022-12-02: qty 1

## 2022-12-02 MED ORDER — FENTANYL CITRATE (PF) 100 MCG/2ML IJ SOLN: 50.0000 ug | INTRAMUSCULAR | Status: DC | PRN

## 2022-12-02 MED ORDER — LACTATED RINGERS IV SOLN
500.0000 mL | INTRAVENOUS | Status: DC | PRN
  Administered 2022-12-03: 500 mL via INTRAVENOUS

## 2022-12-02 MED ORDER — MAGNESIUM SULFATE 40 GM/1000ML IV SOLN
2.0000 g/h | INTRAVENOUS | Status: DC
  Administered 2022-12-02 – 2022-12-03 (×2): 2 g/h via INTRAVENOUS
  Filled 2022-12-02 (×2): qty 1000

## 2022-12-02 MED ORDER — PROCHLORPERAZINE MALEATE 10 MG PO TABS
10.0000 mg | ORAL_TABLET | Freq: Once | ORAL | Status: AC
  Administered 2022-12-02: 10 mg via ORAL
  Filled 2022-12-02: qty 1

## 2022-12-02 NOTE — Consult Note (Signed)
Asked by Dr. Feliberto Gottron to consult on his patient, Stacy Soto, who is admitted for IOL at gestation of 35+4 weeks.  Prenatal Labs: Blood type/Rh A POS   Antibody screen Negative    Rubella Non-immune    Varicella Not immune  RPR NR    HBsAg Neg   Hep C NR   HIV NR    GC neg  Chlamydia neg  Genetic screening cfDNA negative/AFP neg  1 hour GTT 179  3 hour GTT [phone]   GBS Pending       Patient Active Problem List    Diagnosis Date Noted   Chronic hypertension during pregnancy 12/02/2022   Chronic hypertension with superimposed pre-eclampsia 12/02/2022   Fetal macrosomia during pregnancy 12/02/2022   Maternal varicella, non-immune 12/02/2022   Anxiety and depression 12/02/2022   Vitamin B12 deficiency 10/28/2022   Vitamin D deficiency 10/28/2022   Gestational diabetes mellitus (GDM) 08/08/2022   High-risk pregnancy in third trimester 06/15/2022   Prediabetes 03/04/2022   OSA (obstructive sleep apnea) 11/30/2021   PVC's (premature ventricular contractions) 04/28/2021   Rubella non-immune status, antepartum 03/06/2020     Pregnancy complicated by maternal obesity, anxiety and depression, chronic hypertension, GDM, fetal macrosomia at 99%ile, and now superimposed PIH with severe features. She originally was planning for IOL at 37 weeks, and her previous baby was a girl born at 72 weeks. We discussed the problems that might be likely to occur at birth, given preterm delivery and the GDM, including, but not limited to breathing problems related to prematurity. We also discussed possible glucose regulation issues and possible feeding problems related to preterm delivery. Mother has breastfed her last baby and would like the same for this baby. She also would like donor breast milk if needed for a bridge to her own milk supply. I let her know that it may be possible that the baby does well and can stay with her after delivery, although given the history it may also be possible  the baby needs to go to the NICU for a period of time. FOB was not available at the time of this consult, but let mother know that if she or Dad had other questions, we would be available to help answer them. Mother states that she is going to receive BMZ tonight.    Thank you for this consult. Please let us know if we can be of any further assistance.

## 2022-12-02 NOTE — OB Triage Note (Signed)
Pt presents to L&D for PIH eval. Monitors applied and assessing. Initial BP 150/109. Other VSS. Pt denies any LOF, bleeding, and endorses positive fetal movement. Pt reports constant headache, intermittent epigastric pain, and flashes. Pt reports nausea and unable to eat in the past 24 hours. Diarrhea x2 in the past 24 hours. No abnormal edema or clonus present.  Stephania Macfarlane B. Elfie Costanza, RN BSN 12/02/2022 4:29 PM

## 2022-12-02 NOTE — H&P (Cosign Needed Addendum)
OB History & Physical   History of Present Illness:   Chief Complaint: sent from office d/t severe range BP  HPI:  Stacy Soto is a 22 y.o. G2P1001 female at [redacted]w[redacted]d, Patient's last menstrual period was 02/18/2022., not consistent with [place] at [redacted]w[redacted]d, with Estimated Date of Delivery: 01/02/23.  She presents to L&D after being sent from the office d/t a severe range blood pressure today.  She was at a scheduled routine prenatal appointment and NST.  Blood pressure was 157/117.  She had a headache and nausea that started last night.  She took Tylenol and Phenergan and was able to sleep.  Headache became worse during her NST and she reported seeing flashes and spots in her vision.  Headache did not improve with headache cocktail in OB triage.  Stacy Soto also reports intermittent epigastric pain and generally not feeling well or "right" over the past 24 hours. Blood pressures in triage have been mostly mild to borderline severe range.  She's had one severe range blood pressure that required treatment with IV Labetalol. Her pregnancy is currently complicated by chronic hypertension, A1GDM, obesity, fetal macrosomia, and now superimposed preeclampsia with severe features by symptoms and severe range blood pressures.   Reports active fetal movement  Contractions: denies  LOF/SROM: denies  Vaginal bleeding: denies   Factors complicating pregnancy:  Principal Problem:   Chronic hypertension during pregnancy Active Problems:   Rubella non-immune status, antepartum   Gestational diabetes mellitus (GDM)   Chronic hypertension with superimposed pre-eclampsia   Vitamin B12 deficiency   Vitamin D deficiency   Fetal macrosomia during pregnancy   Maternal varicella, non-immune   Anxiety and depression   Patient Active Problem List   Diagnosis Date Noted   Chronic hypertension during pregnancy 12/02/2022   Chronic hypertension with superimposed pre-eclampsia 12/02/2022   Fetal macrosomia during pregnancy  12/02/2022   Maternal varicella, non-immune 12/02/2022   Anxiety and depression 12/02/2022   Vitamin B12 deficiency 10/28/2022   Vitamin D deficiency 10/28/2022   Gestational diabetes mellitus (GDM) 08/08/2022   High-risk pregnancy in third trimester 06/15/2022   Prediabetes 03/04/2022   OSA (obstructive sleep apnea) 11/30/2021   PVC's (premature ventricular contractions) 04/28/2021   Rubella non-immune status, antepartum 03/06/2020    Prenatal Transfer Tool  Maternal Diabetes: Yes:  Diabetes Type:  Diet controlled Genetic Screening: Normal Maternal Ultrasounds/Referrals: Other: macrosomia Fetal Ultrasounds or other Referrals:  Referred to Materal Fetal Medicine  Maternal Substance Abuse:  No Significant Maternal Medications:  Meds include: Other: Procardia, labetalol, Lexapro Significant Maternal Lab Results: None  Maternal Medical History:   Past Medical History:  Diagnosis Date   Anxiety    Atypical chest pain    Depression    Gestational diabetes    taking glyburide   Heart murmur    mild mitral regurigation by echo   Hypertension    IBS (irritable bowel syndrome)    Mitral regurgitation    a. 03/2020 Echo: EF 60-65%, no rwma, nl RV fxn. Mild MR.   Morbid obesity (HCC)    Orthodontics    wears braces   Palpitations    Pregnancy induced hypertension    PVCs (premature ventricular contractions)    a. 06/2021 Z[facility]: Avg HR 94 (51-152). Isolated PVCs w/ burden of 22%. Triggered events = sinus rhythm w/ PVCs.    Past Surgical History:  Procedure Laterality Date   ADENOIDECTOMY  2005   NASAL TURBINATE REDUCTION Left 06/28/2016   Procedure: TURBINATE REDUCTION/SUBMUCOSAL RESECTION;  Surgeon: Geanie Logan,  MD;  Location: [facility];  Service: ENT;  Laterality: Left;   SEPTOPLASTY N/A 06/28/2016   Procedure: SEPTOPLASTY;  Surgeon: Geanie Logan, MD;  Location: [facility];  Service: ENT;  Laterality: N/A;   TONSILLECTOMY  2005   TONSILLECTOMY      No Known  Allergies  Prior to Admission medications   Medication Sig Start Date End Date Taking? Authorizing Provider  acetaminophen (TYLENOL) 500 MG tablet Take 2 tablets (1,000 mg total) by mouth every 6 (six) hours as needed for fever or headache. 11/30/22  Yes Chari Manning Rolla Plate, CNM  aspirin 81 MG chewable tablet Chew 81 mg by mouth daily.   Yes [provider]  cyanocobalamin (VITAMIN B12) 500 MCG tablet Take 500 mcg by mouth daily. Pt states they are injections every week at [facility].  Dose above might not be accurate to the injections.   Yes [provider]  escitalopram (LEXAPRO) 10 MG tablet Take 10 mg by mouth daily.   Yes [provider]  labetalol (NORMODYNE) 300 MG tablet Take 1 tablet (300 mg total) by mouth 2 (two) times daily. 11/30/22 12/30/22 Yes Chari Manning Lucy Lorena, CNM  NIFEdipine (ADALAT CC) 90 MG 24 hr tablet Take 90 mg by mouth daily.   Yes [provider]  Prenatal Vit-Fe Fumarate-FA (PRENATAL MULTIVITAMIN) TABS tablet Take 1 tablet by mouth daily at 12 noon.   Yes [provider]  promethazine (PHENERGAN) 25 MG tablet Take 25 mg by mouth every 6 (six) hours as needed for nausea or vomiting.   Yes [provider]     Prenatal care site:  [facility]  OB History  Gravida Para Term Preterm AB Living  2 1 1  0 0 1  SAB IAB Ectopic Multiple Live Births  0 0 0 0 1    # Outcome Date GA Lbr Len/2nd Weight Sex Type Anes PTL Lv  2 Current           1 Term 07/27/20 [redacted]w[redacted]d 12:40 / 01:20 2770 g F Vag-Spont EPI  LIV     Name: Stacy Soto     Apgar1: 8  Apgar5: 9     Social History: She  reports that she quit smoking about 3 years ago. Her smoking use included e-cigarettes. She has never used smokeless tobacco. She reports that she does not currently use alcohol. She reports that she does not use drugs.  Family History: family history includes Breast cancer in her paternal grandmother; Diabetes in her  father and paternal grandfather; Other in her mother; Polycystic ovary syndrome in her mother.   Review of Systems: A full review of systems was performed and negative except as noted in the HPI.     Physical Exam:  Vital Signs: BP (!) 139/93   Pulse (!) 115   Temp 97.9 F (36.6 C) (Oral)   Resp 16   LMP 02/18/2022   General: no acute distress.  HEENT: normocephalic, atraumatic Heart: regular rate & rhythm Lungs: normal respiratory effort Abdomen: soft, gravid, non-tender;  EFW: 6 1/2 lbs  Pelvic:   External: Normal external female genitalia  Cervix: Dilation: Fingertip / Effacement (%): 50 / Station: -2    Extremities: non-tender, symmetric, No edema bilaterally.  DTRs: 2+/2+  Neurologic: Alert & oriented x 3.    Results for orders placed or performed during the hospital encounter of 12/02/22 (from the past 24 hour(s))  Comprehensive metabolic panel     Status: Abnormal   Collection  Time: 12/02/22  4:25 PM  Result Value Ref Range   Sodium 134 (L) 135 - 145 mmol/L   Potassium 3.9 3.5 - 5.1 mmol/L   Chloride 105 98 - 111 mmol/L   CO2 17 (L) 22 - 32 mmol/L   Glucose, Bld 90 70 - 99 mg/dL   BUN 6 6 - 20 mg/dL   Creatinine, Ser 1.91 (L) 0.44 - 1.00 mg/dL   Calcium 8.6 (L) 8.9 - 10.3 mg/dL   Total Protein 6.3 (L) 6.5 - 8.1 g/dL   Albumin 2.7 (L) 3.5 - 5.0 g/dL   AST 14 (L) 15 - 41 U/L   ALT 9 0 - 44 U/L   Alkaline Phosphatase 124 38 - 126 U/L   Total Bilirubin 0.7 0.3 - 1.2 mg/dL   GFR, Estimated >47 >82 mL/min   Anion gap 12 5 - 15  CBC     Status: Abnormal   Collection Time: 12/02/22  4:25 PM  Result Value Ref Range   WBC 5.8 4.0 - 10.5 K/uL   RBC 4.22 3.87 - 5.11 MIL/uL   Hemoglobin 11.9 (L) 12.0 - 15.0 g/dL   HCT 95.6 (L) 21.3 - 08.6 %   MCV 81.8 80.0 - 100.0 fL   MCH 28.2 26.0 - 34.0 pg   MCHC 34.5 30.0 - 36.0 g/dL   RDW 57.8 46.9 - 62.9 %   Platelets 177 150 - 400 K/uL   nRBC 0.0 0.0 - 0.2 %  Protein / creatinine ratio, urine     Status: Abnormal    Collection Time: 12/02/22  4:25 PM  Result Value Ref Range   Creatinine, Urine 88 mg/dL   Total Protein, Urine 23 mg/dL   Protein Creatinine Ratio 0.26 (H) 0.00 - 0.15 mg/mg[Cre]    Pertinent Results:  Prenatal Labs: Blood type/Rh A POS   Antibody screen Negative    Rubella Non-immune    Varicella Not immune  RPR NR    HBsAg Neg   Hep C NR   HIV NR    GC neg  Chlamydia neg  Genetic screening cfDNA negative/AFP neg  1 hour GTT 179  3 hour GTT 92,204,159,120   GBS Pending      FHT:  FHR: 125 bpm, variability: moderate,  accelerations:  Present,  decelerations:  Absent Category/reactivity:  Category I UC:   irregular mild contractions    Cephalic by Leopolds and SVE - recent OB US on 11/30/2022 confirms cephalic presentation   [place] MFM FETAL BPP WO NON STRESS  Result Date: 11/30/2022 ----------------------------------------------------------------------  OBSTETRICS REPORT                       (Signed Final 11/30/2022 10:29 am) ---------------------------------------------------------------------- Patient Info  ID #:       [id number]                          D.O.B.:  [birth year] (22 yrs)  Name:       Stacy Soto                  Visit Date: 11/30/2022 09:09 am ---------------------------------------------------------------------- Performed By  Attending:        Ma Rings MD         Ref. Address:     [facility]  Performed By:     Reinaldo Raddle            [facility]                    RDMS                                     Fetal Care at                                                             [facility]  Referred By:      Tomasita Morrow CNM ---------------------------------------------------------------------- Orders  #  Description                           Code        Ordered By  1  [place] MFM FETAL BPP WO NON               76819.01    YU FANG     STRESS ----------------------------------------------------------------------  #   Order #                     Accession #                Episode #  1  [id number]                   [id number]                 [id number] ---------------------------------------------------------------------- Indications  Hypertension - Chronic/Pre-existing            O10.019  (Procardia and labetalol)  Gestational diabetes in pregnancy (early       O24.410  onset), diet controlled  Obesity complicating pregnancy, third          O99.213  trimester (pregravid BMI 43)  Low lying placenta, antepartum (resolved)      O44.40  [redacted] weeks gestation of pregnancy                Z3A.35 ---------------------------------------------------------------------- Fetal Evaluation  Num Of Fetuses:         1  Fetal Heart Rate(bpm):  130  Cardiac Activity:       Observed  Presentation:           Cephalic  Placenta:               Posterior  P. Cord Insertion:      Previously seen  Amniotic Fluid  AFI FV:      Within normal limits  AFI Sum(cm)     %Tile       Largest Pocket(cm)  8.39            9           3.75  RUQ(cm)       RLQ(cm)       LUQ(cm)        LLQ(cm)  3.75          1.1           1.03           2.51 ---------------------------------------------------------------------- Biophysical Evaluation  Amniotic F.V:   Within normal limits       F. Tone:        Observed  F. Movement:    Observed                   Score:          8/8  F. Breathing:   Observed ---------------------------------------------------------------------- OB History  Gravidity:    2         Term:   1        Prem:   0        SAB:   0  TOP:          0       Ectopic:  0        Living: 1 ---------------------------------------------------------------------- Gestational Age  LMP:           40w 5d        Date:  02/18/22                 EDD:   11/25/22  Best:          Consuello Closs 2d     Det. By:  Marcella Dubs         EDD:   01/02/23                                      (05/25/22) ---------------------------------------------------------------------- Anatomy  Diaphragm:              Appears normal         Kidneys:                Appear normal  Stomach:               Appears normal, left   Bladder:                Appears normal                         sided ---------------------------------------------------------------------- Comments  Basilia Jumbo is currently at 35 weeks and 2 days.  She was  seen for a BPP due to chronic hypertension treated with both  labetalol and nifedipine, diet controlled gestational diabetes,  and maternal obesity with a BMI of 43.  Her blood pressure in our office today was 148/106.  She  reports an increase in lower extremity swelling over the past  week and has had a persistent headache for the past 1 to 2  days along with seeing floaters in front of her eyes.  A biophysical profile performed today was 8/8.  The AFI was 8.39 cm (within normal limits).  The fetus is in the vertex presentation.  Due to concerns regarding preeclampsia based on her  elevated blood pressures and symptoms, the patient was  sent to labor and delivery following today's ultrasound exam  for evaluation.  She should have PIH labs drawn and a P/C ratio performed.  The implications of and management of preeclampsia was  discussed with the patient.  She understands that delivery is the only treatment for  preeclampsia.  Should she be diagnosed with preeclampsia, delivery is  recommended at her current gestational age.  Should preeclampsia be ruled out, the patient will return to  our office next week for a BPP and growth scan.  The patient understands that should she be sent  home, she  should continue to watch out for signs and symptoms of  preeclampsia and return to the hospital if her symptoms  persist.  She already has an induction of labor scheduled on  December 18, 2022.  The patient stated that all of her questions were answered  today.  A total of 20 minutes was spent counseling and coordinating  the care for this patient.  Greater than 50% of the time was  spent in direct face-to-face  contact. ----------------------------------------------------------------------                  Ma Rings, MD Electronically Signed Final Report   11/30/2022 10:29 am ----------------------------------------------------------------------   [place] MFM FETAL BPP WO NON STRESS  Result Date: 11/21/2022 ----------------------------------------------------------------------  OBSTETRICS REPORT                       (Signed Final 11/21/2022 10:12 am) ---------------------------------------------------------------------- Patient Info  ID #:       [id number]                          D.O.B.:  05/24/2000 (22 yrs)  Name:       Stacy Soto                  Visit Date: 11/21/2022 09:04 am ---------------------------------------------------------------------- Performed By  Attending:        Noralee Space MD        Ref. Address:     [facility]  Performed By:     Reinaldo Raddle            Location[facility]         [facility]                    RDMS                                     Fetal Care at                                                             [facility]  Referred By:      Tomasita Morrow CNM ---------------------------------------------------------------------- Orders  #  Description                           Code        Ordered By  1  [place] MFM FETAL BPP WO NON               34742.59    [facility]     STRESS ----------------------------------------------------------------------  #  Order #                     Accession #                Episode #  1  [id number]                   [id number]  [id number] ---------------------------------------------------------------------- Indications  Hypertension - Chronic/Pre-existing            O10.019  (Procardia and labetalol)  Gestational diabetes in pregnancy (early       O24.410  onset), diet controlled  Obesity complicating pregnancy, third          O99.213  trimester (pregravid BMI 43)  Low lying placenta, antepartum (resolved)       O44.40  [redacted] weeks gestation of pregnancy                Z3A.34 ---------------------------------------------------------------------- Fetal Evaluation  Num Of Fetuses:         1  Fetal Heart Rate(bpm):  129  Cardiac Activity:       Observed  Presentation:           Cephalic  Placenta:               Posterior  P. Cord Insertion:      Previously seen  Amniotic Fluid  AFI FV:      Within normal limits  AFI Sum(cm)     %Tile       Largest Pocket(cm)  12.36           36          7.03  RUQ(cm)       RLQ(cm)       LUQ(cm)        LLQ(cm)  7.03          3.91          1.42           0 ---------------------------------------------------------------------- Biophysical Evaluation  Amniotic F.V:   Within normal limits       F. Tone:        Observed  F. Movement:    Observed                   Score:          8/8  F. Breathing:   Observed ---------------------------------------------------------------------- Biometry  LV:        3.5  mm ---------------------------------------------------------------------- OB History  Gravidity:    2         Term:   1        Prem:   0        SAB:   0  TOP:          0       Ectopic:  0        Living: 1 ---------------------------------------------------------------------- Gestational Age  LMP:           39w 3d        Date:  02/18/22                 EDD:   11/25/22  Best:          34w 0d     Det. ByMarcella Dubs         EDD:   01/02/23                                      (05/25/22) ---------------------------------------------------------------------- Anatomy  Ventricles:            Appears normal         Kidneys:                Appear normal  Diaphragm:  Appears normal         Bladder:                Appears normal  Stomach:               Appears normal, left                         sided ---------------------------------------------------------------------- Impression  Chronic hypertension.  Patient takes nifedipine and labetalol.  Blood pressures today at our office were 137/99 and  139/94  mm Hg.  She does not have signs and symptoms of severe  features of preeclampsia.  Amniotic fluid is normal good fetal activity seen.  Antenatal  testing is reassuring.  BPP 8/8.  Cephalic presentation.  Patient reports she will be undergoing induction of labor on  12/18/2022. ---------------------------------------------------------------------- Recommendations  - Continue weekly BPP until delivery. ----------------------------------------------------------------------                 Noralee Space, MD Electronically Signed Final Report   11/21/2022 10:12 am ----------------------------------------------------------------------   [place] MFM FETAL BPP WO NON STRESS  Result Date: 11/14/2022 ----------------------------------------------------------------------  OBSTETRICS REPORT                       (Signed Final 11/14/2022 10:11 am) ---------------------------------------------------------------------- Patient Info  ID #:       [id number]                          D.O.B.:  2000/06/05 (22 yrs)  Name:       Stacy Soto                  Visit Date: 11/14/2022 09:26 am ---------------------------------------------------------------------- Performed By  Attending:        Ma Rings MD         Ref. Address:     [facility]  Performed By:     Eden Lathe BS      Location:         [facility]                    RDMS RVT                                 Fetal Care at                                                             [facility]  Referred By:      Tomasita Morrow CNM ---------------------------------------------------------------------- Orders  #  Description                           Code        Ordered By  1  [place] MFM FETAL BPP WO NON               14782.95    YU FANG     STRESS ----------------------------------------------------------------------  #  Order #  Accession #                Episode #  1  [id number]                   [id number]                  [id number] ---------------------------------------------------------------------- Indications  Hypertension - Chronic/Pre-existing            O10.019  (Procardia)  Gestational diabetes in pregnancy (early       O24.410  onset), diet controlled  Obesity complicating pregnancy, third          O99.213  trimester (pregravid BMI 43)  Low lying placenta, antepartum (resolved)      O44.40  Encounter for other antenatal screening        Z36.2  follow-up  [redacted] weeks gestation of pregnancy                Z3A.33 ---------------------------------------------------------------------- Fetal Evaluation  Num Of Fetuses:         1  Fetal Heart Rate(bpm):  155  Cardiac Activity:       Observed  Presentation:           Cephalic  Placenta:               Posterior  P. Cord Insertion:      Previously seen  Amniotic Fluid  AFI FV:      Within normal limits  AFI Sum(cm)     %Tile       Largest Pocket(cm)  12.15           34          3.52  RUQ(cm)       RLQ(cm)       LUQ(cm)        LLQ(cm)  3.52          3.05          2.32           3.26 ---------------------------------------------------------------------- Biophysical Evaluation  Amniotic F.V:   Within normal limits       F. Tone:        Observed  F. Movement:    Observed                   Score:          8/8  F. Breathing:   Observed ---------------------------------------------------------------------- Biometry  LV:        4.7  mm ---------------------------------------------------------------------- OB History  Gravidity:    2         Term:   1        Prem:   0        SAB:   0  TOP:          0       Ectopic:  0        Living: 1 ---------------------------------------------------------------------- Gestational Age  LMP:           38w 3d        Date:  02/18/22                 EDD:   11/25/22  Best:          33w 0d     Det. ByMarcella Dubs         EDD:   01/02/23                                      (  05/25/22) ----------------------------------------------------------------------  Anatomy  Ventricles:            Appears normal         Kidneys:                Appear normal  Heart:                 Appears normal         Bladder:                Appears normal                         (4CH, axis, and                         situs)  Stomach:               Appears normal, left                         sided ---------------------------------------------------------------------- Cervix Uterus Adnexa  Cervix  Not visualized (advanced GA >24wks)  Uterus  No abnormality visualized.  Right Ovary  Not visualized.  Left Ovary  Not visualized.  Cul De Sac  No free fluid seen.  Adnexa  No abnormality visualized ---------------------------------------------------------------------- Comments  This patient was seen for a BPP due to chronic hypertension  treated with both labetalol and nifedipine, diet controlled  gestational diabetes, and maternal obesity with a BMI of 43.  She denies any problems since her last exam.  A biophysical profile performed today was 8/8.  The AFI was 12.15 cm (within normal limits).  She will return in 1 week for another BPP. ----------------------------------------------------------------------                   Ma Rings, MD Electronically Signed Final Report   11/14/2022 10:11 am ----------------------------------------------------------------------   [place] MFM OB FOLLOW UP  Result Date: 11/07/2022 ----------------------------------------------------------------------  OBSTETRICS REPORT                       (Signed Final 11/07/2022 09:45 am) ---------------------------------------------------------------------- Patient Info  ID #:       [id number]                          D.O.B.:  03-31-2000 (22 yrs)  Name:       Stacy Soto                  Visit Date: 11/07/2022 08:46 am ---------------------------------------------------------------------- Performed By  Attending:        Noralee Space MD        Ref. Address:     [facility]  Performed By:     Reinaldo Raddle            Location[facility]          [facility]                    RDMS                                     Fetal Care at  [facility]  Referred By:      Tomasita Morrow CNM ---------------------------------------------------------------------- Orders  #  Description                           Code        Ordered By  1  [place] MFM OB FOLLOW UP                   76816.01    RAVI SHANKAR  2  [place] MFM FETAL BPP WO NON               76819.01    [facility]     STRESS ----------------------------------------------------------------------  #  Order #                     Accession #                Episode #  1  [id number]                   [id number]                 [id number]  2  [id number]                   [id number]                 [id number] ---------------------------------------------------------------------- Indications  Hypertension - Chronic/Pre-existing            O10.019  (Procardia)  Gestational diabetes in pregnancy (early       O24.410  onset), diet controlled  Obesity complicating pregnancy, second         O99.212  trimester (pregravid BMI 43)  Low lying placenta, antepartum (resolved)      O44.40  [redacted] weeks gestation of pregnancy                Z3A.32 ---------------------------------------------------------------------- Vital Signs  BP:          128/86 ---------------------------------------------------------------------- Fetal Evaluation  Num Of Fetuses:         1  Fetal Heart Rate(bpm):  138  Cardiac Activity:       Observed  Presentation:           Cephalic  Placenta:               Posterior  P. Cord Insertion:      Previously seen  Amniotic Fluid  AFI FV:      Within normal limits  AFI Sum(cm)     %Tile       Largest Pocket(cm)  11.48           27          4.44  RUQ(cm)       RLQ(cm)       LUQ(cm)        LLQ(cm)  2.14          4.44          2.15           2.75 ---------------------------------------------------------------------- Biophysical  Evaluation  Amniotic F.V:   Within normal limits       F. Tone:        Observed  F. Movement:    Observed  Score:          8/8  F. Breathing:   Observed ---------------------------------------------------------------------- Biometry  BPD:     82.93  mm     G. Age:  33w 2d         80  %    CI:        77.14   %    70 - 86                                                          FL/HC:      21.5   %    19.1 - 21.3  HC:    298.99   mm     G. Age:  33w 1d         42  %    HC/AC:      0.89        0.96 - 1.17  AC:    334.32   mm     G. Age:  37w 2d       > 99  %    FL/BPD:     77.6   %    71 - 87  FL:      64.37  mm     G. Age:  33w 2d         71  %    FL/AC:      19.3   %    20 - 24  LV:        2.6  mm  Est. FW:    2673  gm    5 lb 14 oz    > 99  % ---------------------------------------------------------------------- OB History  Gravidity:    2         Term:   1        Prem:   0        SAB:   0  TOP:          0       Ectopic:  0        Living: 1 ---------------------------------------------------------------------- Gestational Age  LMP:           37w 3d        Date:  02/18/22                 EDD:   11/25/22  U/S Today:     34w 2d                                        EDD:   12/17/22  Best:          32w 0d     Det. ByMarcella Dubs         EDD:   01/02/23                                      (05/25/22) ---------------------------------------------------------------------- Anatomy  Cranium:               Appears normal         Aortic Arch:  Not well visualized  Cavum:                 Appears normal         Ductal Arch:            Previously seen  Ventricles:            Appears normal         Diaphragm:              Appears normal  Choroid Plexus:        Previously seen        Stomach:                Appears normal, left                                                                        sided  Cerebellum:            Previously seen        Abdomen:                Appears normal  Posterior  Fossa:       Previously seen        Abdominal Wall:         Previously seen  Nuchal Fold:           Previously seen        Cord Vessels:           Previously seen  Face:                  Orbits and profile     Kidneys:                Appear normal                         previously seen  Lips:                  Previously seen        Bladder:                Appears normal  Thoracic:              Appears normal         Spine:                  Previously seen  Heart:                 Previously seen        Upper Extremities:      Previously seen  RVOT:                  Previously seen        Lower Extremities:      Previously seen  LVOT:                  Previously seen  Other:  Heels/feet and open hands/5th digits previously visualized. Nasal          bone, lenses, maxilla, mandible and falx previously visualized VC,          3VV and 3VTV previously visualized.  Female gender previously seen. ---------------------------------------------------------------------- Cervix Uterus Adnexa  Cervix  Not visualized (advanced GA >24wks)  Uterus  No abnormality visualized.  Right Ovary  Not visualized.  Left Ovary  Not visualized.  Cul De Sac  No free fluid seen.  Adnexa  No adnexal mass visualized ---------------------------------------------------------------------- Impression  Chronic hypertension.  Well-controlled with nifedipine and  labetalol.  Blood pressure today at our office is 128/86 mmHg.  Gestational diabetes.  Reportedly well-controlled on diet.  Amniotic fluid is normal good fetal activity seen.  The  estimated fetal weight and abdominal circumference  measurements are at the 99th percentiles.  Cephalic  presentation.  Antenatal testing is reassuring.  BPP 8/8.  Ultrasound has limitations in accurately estimating fetal  weights. ---------------------------------------------------------------------- Recommendations  - Continue weekly BPP till delivery.  -Delivery at 37 or [redacted] weeks gestation.  ----------------------------------------------------------------------                 Noralee Space, MD Electronically Signed Final Report   11/07/2022 09:45 am ----------------------------------------------------------------------   [place] MFM FETAL BPP WO NON STRESS  Result Date: 11/07/2022 ----------------------------------------------------------------------  OBSTETRICS REPORT                       (Signed Final 11/07/2022 09:45 am) ---------------------------------------------------------------------- Patient Info  ID #:       [id number]                          D.O.B.:  03/09/2001 (22 yrs)  Name:       Stacy Soto                  Visit Date: 11/07/2022 08:46 am ---------------------------------------------------------------------- Performed By  Attending:        Noralee Space MD        Ref. Address:     [facility]  Performed By:     Reinaldo Raddle            [facility]         [facility]                                     [facility] at                                                             [facility]  Referred By:      Tomasita Morrow CNM ---------------------------------------------------------------------- Orders  #  Description                           Code        Ordered By  1  [place] MFM OB FOLLOW UP                   40981.19    RAVI SHANKAR  2  [place] MFM FETAL BPP WO NON  27253.66    RAVI [facility]     STRESS ----------------------------------------------------------------------  #  Order #                     Accession #                Episode #  1  [id number]                   [id number]                 [id number]  2  [id number]                   [id number]                 [id number] ---------------------------------------------------------------------- Indications  Hypertension - Chronic/Pre-existing            O10.019  (Procardia)  Gestational diabetes in pregnancy (early       O24.410  onset), diet controlled  Obesity  complicating pregnancy, second         O99.212  trimester (pregravid BMI 43)  Low lying placenta, antepartum (resolved)      O44.40  [redacted] weeks gestation of pregnancy                Z3A.32 ---------------------------------------------------------------------- Vital Signs  BP:          128/86 ---------------------------------------------------------------------- Fetal Evaluation  Num Of Fetuses:         1  Fetal Heart Rate(bpm):  138  Cardiac Activity:       Observed  Presentation:           Cephalic  Placenta:               Posterior  P. Cord Insertion:      Previously seen  Amniotic Fluid  AFI FV:      Within normal limits  AFI Sum(cm)     %Tile       Largest Pocket(cm)  11.48           27          4.44  RUQ(cm)       RLQ(cm)       LUQ(cm)        LLQ(cm)  2.14          4.44          2.15           2.75 ---------------------------------------------------------------------- Biophysical Evaluation  Amniotic F.V:   Within normal limits       F. Tone:        Observed  F. Movement:    Observed                   Score:          8/8  F. Breathing:   Observed ---------------------------------------------------------------------- Biometry  BPD:     82.93  mm     G. Age:  33w 2d         80  %    CI:        77.14   %    70 - 86  FL/HC:      21.5   %    19.1 - 21.3  HC:    298.99   mm     G. Age:  33w 1d         42  %    HC/AC:      0.89        0.96 - 1.17  AC:    334.32   mm     G. Age:  37w 2d       > 99  %    FL/BPD:     77.6   %    71 - 87  FL:      64.37  mm     G. Age:  33w 2d         71  %    FL/AC:      19.3   %    20 - 24  LV:        2.6  mm  Est. FW:    2673  gm    5 lb 14 oz    > 99  % ---------------------------------------------------------------------- OB History  Gravidity:    2         Term:   1        Prem:   0        SAB:   0  TOP:          0       Ectopic:  0        Living: 1 ----------------------------------------------------------------------  Gestational Age  LMP:           37w 3d        Date:  02/18/22                 EDD:   11/25/22  U/S Today:     34w 2d                                        EDD:   12/17/22  Best:          32w 0d     Det. By:  Marcella Dubs         EDD:   01/02/23                                      (05/25/22) ---------------------------------------------------------------------- Anatomy  Cranium:               Appears normal         Aortic Arch:            Not well visualized  Cavum:                 Appears normal         Ductal Arch:            Previously seen  Ventricles:            Appears normal         Diaphragm:              Appears normal  Choroid Plexus:        Previously seen        Stomach:  Appears normal, left                                                                        sided  Cerebellum:            Previously seen        Abdomen:                Appears normal  Posterior Fossa:       Previously seen        Abdominal Wall:         Previously seen  Nuchal Fold:           Previously seen        Cord Vessels:           Previously seen  Face:                  Orbits and profile     Kidneys:                Appear normal                         previously seen  Lips:                  Previously seen        Bladder:                Appears normal  Thoracic:              Appears normal         Spine:                  Previously seen  Heart:                 Previously seen        Upper Extremities:      Previously seen  RVOT:                  Previously seen        Lower Extremities:      Previously seen  LVOT:                  Previously seen  Other:  Heels/feet and open hands/5th digits previously visualized. Nasal          bone, lenses, maxilla, mandible and falx previously visualized VC,          3VV and 3VTV previously visualized. Female gender previously seen. ---------------------------------------------------------------------- Cervix Uterus Adnexa  Cervix  Not visualized (advanced GA >24wks)  Uterus   No abnormality visualized.  Right Ovary  Not visualized.  Left Ovary  Not visualized.  Cul De Sac  No free fluid seen.  Adnexa  No adnexal mass visualized ---------------------------------------------------------------------- Impression  Chronic hypertension.  Well-controlled with nifedipine and  labetalol.  Blood pressure today at our office is 128/86 mmHg.  Gestational diabetes.  Reportedly well-controlled on diet.  Amniotic fluid is normal good fetal activity seen.  The  estimated fetal weight and abdominal circumference  measurements are at the 99th percentiles.  Cephalic  presentation.  Antenatal testing is reassuring.  BPP 8/8.  Ultrasound has  limitations in accurately estimating fetal  weights. ---------------------------------------------------------------------- Recommendations  - Continue weekly BPP till delivery.  -Delivery at 37 or [redacted] weeks gestation. ----------------------------------------------------------------------                 Noralee Space, MD Electronically Signed Final Report   11/07/2022 09:45 am ----------------------------------------------------------------------   US Venous Img Lower Bilateral (DVT)  Result Date: 11/03/2022 CLINICAL DATA:  10026 Shortness of breath 10026 161096 [redacted] weeks gestation of pregnancy 620473 EXAM: BILATERAL LOWER EXTREMITY VENOUS DOPPLER ULTRASOUND TECHNIQUE: Gray-scale sonography with graded compression, as well as color Doppler and duplex ultrasound were performed to evaluate the lower extremity deep venous systems from the level of the common femoral vein and including the common femoral, femoral, profunda femoral, popliteal and calf veins including the posterior tibial, peroneal and gastrocnemius veins when visible. The superficial great saphenous vein was also interrogated. Spectral Doppler was utilized to evaluate flow at rest and with distal augmentation maneuvers in the common femoral, femoral and popliteal veins. COMPARISON:  08/24/2018 FINDINGS: RIGHT  LOWER EXTREMITY Common Femoral Vein: No evidence of thrombus. Normal compressibility, respiratory phasicity and response to augmentation. Saphenofemoral Junction: No evidence of thrombus. Normal compressibility and flow on color Doppler imaging. Profunda Femoral Vein: No evidence of thrombus. Normal compressibility and flow on color Doppler imaging. Femoral Vein: No evidence of thrombus. Normal compressibility, respiratory phasicity and response to augmentation. Popliteal Vein: No evidence of thrombus. Normal compressibility, respiratory phasicity and response to augmentation. Calf Veins: No evidence of thrombus. Normal compressibility and flow on color Doppler imaging. Superficial Great Saphenous Vein: No evidence of thrombus. Normal compressibility. Venous Reflux:  None. Other Findings:  None. LEFT LOWER EXTREMITY Common Femoral Vein: No evidence of thrombus. Normal compressibility, respiratory phasicity and response to augmentation. Saphenofemoral Junction: No evidence of thrombus. Normal compressibility and flow on color Doppler imaging. Profunda Femoral Vein: No evidence of thrombus. Normal compressibility and flow on color Doppler imaging. Femoral Vein: No evidence of thrombus. Normal compressibility, respiratory phasicity and response to augmentation. Popliteal Vein: No evidence of thrombus. Normal compressibility, respiratory phasicity and response to augmentation. Calf Veins: No evidence of thrombus. Normal compressibility and flow on color Doppler imaging. Superficial Great Saphenous Vein: No evidence of thrombus. Normal compressibility. Venous Reflux:  None. Other Findings:  None. IMPRESSION: No evidence of deep venous thrombosis in either lower extremity. Electronically Signed   By: Duanne Guess D.O.   On: 11/03/2022 17:35   DG Chest 2 View  Result Date: 11/03/2022 CLINICAL DATA:  Shortness of breath. Recently tested positive for COVID 19. EXAM: CHEST - 2 VIEW COMPARISON:  Radiographs 03/12/2019  and 05/06/2018.  CT 08/15/2018. FINDINGS: The heart size and mediastinal contours are normal. The lungs are clear. There is no pleural effusion or pneumothorax. No acute osseous findings are identified. IMPRESSION: Stable chest.  No evidence of active cardiopulmonary process. Electronically Signed   By: Carey Bullocks M.D.   On: 11/03/2022 16:23    Assessment:  TYSHIRA PRAKASH is a 22 y.o. G12P1001 female at [redacted]w[redacted]d with chronic hypertension with superimposed preeclampsia.   Plan:  1. Admit to Labor & Delivery - consents reviewed and obtained - Dr. Feliberto Gottron notified of admission and plan of care   2. Fetal Well being  - Fetal Tracing: cat 1 - Group B Streptococcus unknown - PCR collected  - Presentation: cephalic  confirmed by SVE   3. Routine OB: - Prenatal labs reviewed, as above - Rh positive - CBC, T&S, RPR on admit - Clear  liquid diet , continuous IV fluids  4. Chronic hypertension with superimposed preeclampsia -Recommend proceeding with induction of labor d/t superimposed preeclampsia with severe features.   -Labs stable  -Mag sulfate infusion for seizure prophylaxis  -Will plan to give steroids for fetal lung maturity but will not delay starting an induction d/t severe features   5. Induction of labor  - Contractions monitored with external toco - Pelvis proven to 2770 grams, adequate for trial of labor  - Plan for induction with misoprostol  - Induction with oxytocin, AROM, and cervical balloon as appropriate  - Plan for  continuous fetal monitoring - Maternal pain control as desired; planning IVPM and nitrous oxide - Anticipate vaginal delivery  6. Post Partum Planning: - Infant feeding: breast feeding - Contraception: IUD - hormonal  - Flu vaccine:  N/A  - Tdap vaccine: Given prenatally - RSV vaccine:  N/A  Gustavo Lah, CNM 12/02/22 8:30 PM  Margaretmary Eddy, CNM Certified Nurse Midwife [facility]

## 2022-12-03 ENCOUNTER — Inpatient Hospital Stay: Payer: Medicaid Other | Admitting: Anesthesiology

## 2022-12-03 ENCOUNTER — Encounter: Payer: Self-pay | Admitting: Obstetrics and Gynecology

## 2022-12-03 LAB — RPR: RPR Ser Ql: NONREACTIVE

## 2022-12-03 LAB — CBC WITH DIFFERENTIAL/PLATELET
Abs Immature Granulocytes: 0.07 10*3/uL (ref 0.00–0.07)
Basophils Absolute: 0 10*3/uL (ref 0.0–0.1)
Basophils Relative: 0 %
Eosinophils Absolute: 0 10*3/uL (ref 0.0–0.5)
Eosinophils Relative: 0 %
HCT: 36.4 % (ref 36.0–46.0)
Hemoglobin: 12.2 g/dL (ref 12.0–15.0)
Immature Granulocytes: 1 %
Lymphocytes Relative: 11 %
Lymphs Abs: 0.9 10*3/uL (ref 0.7–4.0)
MCH: 27.9 pg (ref 26.0–34.0)
MCHC: 33.5 g/dL (ref 30.0–36.0)
MCV: 83.3 fL (ref 80.0–100.0)
Monocytes Absolute: 0.4 10*3/uL (ref 0.1–1.0)
Monocytes Relative: 5 %
Neutro Abs: 6.8 10*3/uL (ref 1.7–7.7)
Neutrophils Relative %: 83 %
Platelets: 197 10*3/uL (ref 150–400)
RBC: 4.37 MIL/uL (ref 3.87–5.11)
RDW: 12.8 % (ref 11.5–15.5)
WBC: 8.2 10*3/uL (ref 4.0–10.5)
nRBC: 0 % (ref 0.0–0.2)

## 2022-12-03 LAB — HEMOGLOBIN A1C
Hgb A1c MFr Bld: 6.3 % — ABNORMAL HIGH (ref 4.8–5.6)
Mean Plasma Glucose: 134.11 mg/dL

## 2022-12-03 LAB — MAGNESIUM: Magnesium: 4.7 mg/dL — ABNORMAL HIGH (ref 1.7–2.4)

## 2022-12-03 LAB — GLUCOSE, CAPILLARY
Glucose-Capillary: 160 mg/dL — ABNORMAL HIGH (ref 70–99)
Glucose-Capillary: 142 mg/dL — ABNORMAL HIGH (ref 70–99)
Glucose-Capillary: 121 mg/dL — ABNORMAL HIGH (ref 70–99)

## 2022-12-03 LAB — CHLAMYDIA/NGC RT PCR (ARMC ONLY)
Chlamydia Tr: NOT DETECTED
N gonorrhoeae: NOT DETECTED

## 2022-12-03 MED ORDER — DEXTROSE IN LACTATED RINGERS 5 % IV SOLN: INTRAVENOUS | Status: DC

## 2022-12-03 MED ORDER — FENTANYL-BUPIVACAINE-NACL 0.5-0.125-0.9 MG/250ML-% EP SOLN
12.0000 mL/h | EPIDURAL | Status: DC | PRN
  Administered 2022-12-03: 12 mL/h via EPIDURAL

## 2022-12-03 MED ORDER — DIBUCAINE (PERIANAL) 1 % EX OINT: 1.0000 | TOPICAL_OINTMENT | CUTANEOUS | Status: DC | PRN

## 2022-12-03 MED ORDER — OXYCODONE HCL 5 MG PO TABS
5.0000 mg | ORAL_TABLET | ORAL | Status: DC | PRN
  Administered 2022-12-03 (×2): 5 mg via ORAL
  Filled 2022-12-03: qty 1

## 2022-12-03 MED ORDER — EPHEDRINE 5 MG/ML INJ: 10.0000 mg | INTRAVENOUS | Status: DC | PRN

## 2022-12-03 MED ORDER — NIFEDIPINE ER OSMOTIC RELEASE 30 MG PO TB24
90.0000 mg | ORAL_TABLET | Freq: Every day | ORAL | Status: DC
  Administered 2022-12-03: 90 mg via ORAL
  Filled 2022-12-03: qty 3

## 2022-12-03 MED ORDER — LIDOCAINE-EPINEPHRINE (PF) 1.5 %-1:200000 IJ SOLN
INTRAMUSCULAR | Status: DC | PRN
  Administered 2022-12-03: 3 mL via EPIDURAL

## 2022-12-03 MED ORDER — LIDOCAINE HCL (PF) 1 % IJ SOLN
INTRAMUSCULAR | Status: DC | PRN
  Administered 2022-12-03: 3 mL via SUBCUTANEOUS

## 2022-12-03 MED ORDER — BENZOCAINE-MENTHOL 20-0.5 % EX AERO
1.0000 | INHALATION_SPRAY | CUTANEOUS | Status: DC | PRN
  Filled 2022-12-03: qty 56

## 2022-12-03 MED ORDER — VARICELLA VIRUS VACCINE LIVE 1350 PFU/0.5ML IJ SUSR
0.5000 mL | INTRAMUSCULAR | Status: DC | PRN
  Filled 2022-12-03: qty 0.5

## 2022-12-03 MED ORDER — ACETAMINOPHEN 325 MG PO TABS
650.0000 mg | ORAL_TABLET | ORAL | Status: DC | PRN
  Administered 2022-12-04 – 2022-12-07 (×4): 650 mg via ORAL
  Filled 2022-12-03 (×4): qty 2

## 2022-12-03 MED ORDER — DIPHENHYDRAMINE HCL 50 MG/ML IJ SOLN: 12.5000 mg | INTRAMUSCULAR | Status: DC | PRN

## 2022-12-03 MED ORDER — DIPHENHYDRAMINE HCL 25 MG PO CAPS: 25.0000 mg | ORAL_CAPSULE | Freq: Four times a day (QID) | ORAL | Status: DC | PRN

## 2022-12-03 MED ORDER — INSULIN REGULAR(HUMAN) IN NACL 100-0.9 UT/100ML-% IV SOLN
INTRAVENOUS | Status: DC
  Filled 2022-12-03: qty 100

## 2022-12-03 MED ORDER — LABETALOL HCL 100 MG PO TABS
300.0000 mg | ORAL_TABLET | Freq: Two times a day (BID) | ORAL | Status: DC
  Administered 2022-12-03: 300 mg via ORAL
  Filled 2022-12-03: qty 3

## 2022-12-03 MED ORDER — ONDANSETRON HCL 4 MG PO TABS: 4.0000 mg | ORAL_TABLET | ORAL | Status: DC | PRN

## 2022-12-03 MED ORDER — PHENYLEPHRINE 80 MCG/ML (10ML) SYRINGE FOR IV PUSH (FOR BLOOD PRESSURE SUPPORT): 80.0000 ug | PREFILLED_SYRINGE | INTRAVENOUS | Status: DC | PRN

## 2022-12-03 MED ORDER — SIMETHICONE 80 MG PO CHEW: 80.0000 mg | CHEWABLE_TABLET | ORAL | Status: DC | PRN

## 2022-12-03 MED ORDER — SENNOSIDES-DOCUSATE SODIUM 8.6-50 MG PO TABS
2.0000 | ORAL_TABLET | Freq: Every day | ORAL | Status: DC
  Administered 2022-12-04 – 2022-12-07 (×4): 2 via ORAL
  Filled 2022-12-03 (×4): qty 2

## 2022-12-03 MED ORDER — ESCITALOPRAM OXALATE 10 MG PO TABS
10.0000 mg | ORAL_TABLET | Freq: Every day | ORAL | Status: DC
  Administered 2022-12-04 – 2022-12-07 (×4): 10 mg via ORAL
  Filled 2022-12-03 (×5): qty 1

## 2022-12-03 MED ORDER — COCONUT OIL OIL: 1.0000 | TOPICAL_OIL | Status: DC | PRN

## 2022-12-03 MED ORDER — LACTATED RINGERS IV SOLN: INTRAVENOUS | Status: DC

## 2022-12-03 MED ORDER — DEXTROSE 50 % IV SOLN: 0.0000 mL | INTRAVENOUS | Status: DC | PRN

## 2022-12-03 MED ORDER — IBUPROFEN 600 MG PO TABS
600.0000 mg | ORAL_TABLET | Freq: Four times a day (QID) | ORAL | Status: DC
  Administered 2022-12-04 – 2022-12-07 (×15): 600 mg via ORAL
  Filled 2022-12-03 (×16): qty 1

## 2022-12-03 MED ORDER — WITCH HAZEL-GLYCERIN EX PADS
1.0000 | MEDICATED_PAD | CUTANEOUS | Status: DC | PRN
  Filled 2022-12-03 (×2): qty 100

## 2022-12-03 MED ORDER — FERROUS SULFATE 325 (65 FE) MG PO TABS
325.0000 mg | ORAL_TABLET | Freq: Two times a day (BID) | ORAL | Status: DC
  Administered 2022-12-04 – 2022-12-07 (×8): 325 mg via ORAL
  Filled 2022-12-03 (×8): qty 1

## 2022-12-03 MED ORDER — LACTATED RINGERS IV SOLN
500.0000 mL | Freq: Once | INTRAVENOUS | Status: AC
  Administered 2022-12-03: 500 mL via INTRAVENOUS

## 2022-12-03 MED ORDER — BUPIVACAINE HCL (PF) 0.25 % IJ SOLN
INTRAMUSCULAR | Status: DC | PRN
  Administered 2022-12-03: 8 mL via EPIDURAL

## 2022-12-03 MED ORDER — MEASLES, MUMPS & RUBELLA VAC IJ SOLR
0.5000 mL | Freq: Once | INTRAMUSCULAR | Status: DC
  Filled 2022-12-03: qty 0.5

## 2022-12-03 MED ORDER — OXYCODONE HCL 5 MG PO TABS: 10.0000 mg | ORAL_TABLET | ORAL | Status: DC | PRN

## 2022-12-03 MED ORDER — PRENATAL MULTIVITAMIN CH
1.0000 | ORAL_TABLET | Freq: Every day | ORAL | Status: DC
  Administered 2022-12-04 – 2022-12-07 (×4): 1 via ORAL
  Filled 2022-12-03 (×4): qty 1

## 2022-12-03 MED ORDER — ONDANSETRON HCL 4 MG/2ML IJ SOLN: 4.0000 mg | INTRAMUSCULAR | Status: DC | PRN

## 2022-12-03 MED ORDER — FENTANYL-BUPIVACAINE-NACL 0.5-0.125-0.9 MG/250ML-% EP SOLN
EPIDURAL | Status: AC
  Filled 2022-12-03: qty 250

## 2022-12-03 NOTE — Anesthesia Procedure Notes (Signed)
Epidural Patient location during procedure: OB  Staffing Anesthesiologist: Stormy Fabian, CRNA Performed: resident/CRNA   Preanesthetic Checklist Completed: patient identified, IV checked, site marked, risks and benefits discussed, surgical consent, monitors and equipment checked, pre-op evaluation and timeout performed  Epidural Patient position: sitting Prep: ChloraPrep Patient monitoring: heart rate, continuous pulse ox and blood pressure Approach: midline Location: L3-L4 Injection technique: LOR saline  Needle:  Needle type: Tuohy  Needle gauge: 17 G Needle length: 9 cm Needle insertion depth: 8 cm Catheter type: closed end flexible Catheter size: 19 Gauge Catheter at skin depth: 13 cm Test dose: negative and 1.5% lidocaine with Epi 1:200 K  Assessment Sensory level: T10 Events: blood not aspirated, no cerebrospinal fluid, injection not painful, no injection resistance, no paresthesia and negative IV test  Additional Notes First/one attempt Pt. Evaluated and documentation done after procedure finished. Patient identified. Risks/Benefits/Options discussed with patient including but not limited to bleeding, infection, nerve damage, paralysis, failed block, incomplete pain control, headache, blood pressure changes, nausea, vomiting, reactions to medication both or allergic, itching and postpartum back pain. Confirmed with bedside nurse the patient's most recent platelet count. Confirmed with patient that they are not currently taking any anticoagulation, have any bleeding history or any family history of bleeding disorders. Patient expressed understanding and wished to proceed. All questions were answered. Sterile technique was used throughout the entire procedure. Please see nursing notes for vital signs. Test dose was given through epidural catheter and negative prior to continuing to dose epidural or start infusion. Warning signs of high block given to the patient including  shortness of breath, tingling/numbness in hands, complete motor block, or any concerning symptoms with instructions to call for help. Patient was given instructions on fall risk and not to get out of bed. All questions and concerns addressed with instructions to call with any issues or inadequate analgesia.     Patient tolerated the insertion well without immediate complications.  Reason for block: procedure for painReason for block:procedure for pain

## 2022-12-03 NOTE — Discharge Summary (Signed)
Obstetrical Discharge Summary  Patient Name: Stacy Soto DOB: 04-Jan-2001 MRN: [id number]  Date of Admission: 12/02/2022 Date of Delivery: 12/03/22 Delivered by: Haroldine Laws, CNM  Date of Discharge: 12/03/2022  Primary OB: [facility] [id number] last menstrual period was 02/18/2022. EDC Estimated Date of Delivery: 01/02/23 Gestational Age at Delivery: [redacted]w[redacted]d   Antepartum complications:    Chronic hypertension during pregnancy   Rubella non-immune status, antepartum   Gestational diabetes mellitus (GDM)   Chronic hypertension with superimposed pre-eclampsia   Vitamin B12 deficiency   Vitamin D deficiency   Fetal macrosomia during pregnancy   Maternal varicella, non-immune   Anxiety and depression  Admitting Diagnosis: Chronic hypertension during pregnancy [O10.919] Chronic hypertension with superimposed pre-eclampsia [O11.9]  Secondary Diagnosis: Patient Active Problem List   Diagnosis Date Noted   NSVD (normal spontaneous vaginal delivery) 12/03/2022   Chronic hypertension during pregnancy 12/02/2022   Chronic hypertension with superimposed pre-eclampsia 12/02/2022   Fetal macrosomia during pregnancy 12/02/2022   Maternal varicella, non-immune 12/02/2022   Anxiety and depression 12/02/2022   Vitamin B12 deficiency 10/28/2022   Vitamin D deficiency 10/28/2022   Gestational diabetes mellitus (GDM) 08/08/2022   High-risk pregnancy in third trimester 06/15/2022   OSA (obstructive sleep apnea) 11/30/2021   PVC's (premature ventricular contractions) 04/28/2021   Rubella non-immune status, antepartum 03/06/2020    Discharge Diagnosis: Term Pregnancy Delivered, CHTN with superimposed preeclampsia, and GDM A1      Induction: AROM, Pitocin, and Cytotec Complications: None Intrapartum complications/course: see delivery Delivery Type: spontaneous vaginal delivery Anesthesia: epidural anesthesia Placenta: spontaneous To Pathology: Yes  Laceration: none Episiotomy:  none Newborn Data: Live born female  Birth Weight: 8 lb 3.2 oz (3720 g) APGAR: 9, 9  Newborn Delivery   Birth date/time: 12/03/2022 06:56:00 Delivery type: Vaginal, Spontaneous     Postpartum Procedures: {postpartum procedures:3041411} [place]:     08/04/2020   10:47 AM 07/28/2020    9:55 AM  Edinburgh Postnatal Depression Scale Screening Tool  I have been able to laugh and see the funny side of things. 0 0  I have looked forward with enjoyment to things. 0 0  I have blamed myself unnecessarily when things went wrong. 2 1  I have been anxious or worried for no good reason. 1 2  I have felt scared or panicky for no good reason. 0 0  Things have been getting on top of me. 0 0  I have been so unhappy that I have had difficulty sleeping. 0 0  I have felt sad or miserable. 0 0  I have been so unhappy that I have been crying. 0 1  The thought of harming myself has occurred to me. 0 0  Edinburgh Postnatal Depression Scale Total 3 4     Post partum course: *** Patient had an uncomplicated postpartum course.  By time of discharge on PPD#***, her pain was controlled on oral pain medications; she had appropriate lochia and was ambulating, voiding without difficulty and tolerating regular diet.  She was deemed stable for discharge to home.    Discharge Physical Exam: *** BP (!) 145/83   Pulse (!) 106   Temp 97.6 F (36.4 C) (Oral)   Resp 16   Ht 5\' 5"  (1.651 m)   Wt 122.5 kg   LMP 02/18/2022   SpO2 100%   Breastfeeding Unknown   BMI 44.93 kg/m   General: NAD CV: RRR Pulm: CTABL, nl effort ABD: s/nd/nt, fundus firm and below the umbilicus Lochia: moderate Perineum:minimal edema/intact  Incision: n/a DVT Evaluation: LE non-ttp, no evidence of DVT on exam.  Hemoglobin  Date Value Ref Range Status  12/03/2022 12.2 12.0 - 15.0 g/dL Final  57/84/6962 95.2 11.1 - 15.9 g/dL Final   HCT  Date Value Ref Range Status  12/03/2022 36.4 36.0 - 46.0 % Final   Hematocrit  Date Value  Ref Range Status  04/28/2021 42.4 34.0 - 46.6 % Final    Risk assessment for postpartum VTE and prophylactic treatment: Very high risk factors: None High risk factors: BMI 40-50 kg/m2 Moderate risk factors: Preeclampsia   Postpartum VTE prophylaxis with LMWH {not indicated/requested/declined:14582::"not indicated"}  Disposition: stable, discharge to home. Baby Feeding: breast feeding Baby Disposition: home with mom  Rh Immune globulin indicated: No Rubella vaccine given: {ACTIONS; WAS GIVEN/WAS NOT INDICATED:16401::"was not indicated"} Varivax vaccine given: {ACTIONS; WAS GIVEN/WAS NOT INDICATED:16401::"was not indicated"} Flu vaccine given in AP setting: {YES/NO AS:20300::"Yes "} Tdap vaccine given in AP setting: Yes   Contraception: IUD  Prenatal Labs:  Blood type/Rh A POS   Antibody screen Negative    Rubella Non-immune    Varicella Not immune  RPR NR    HBsAg Neg   Hep C NR   HIV NR    GC neg  Chlamydia neg  Genetic screening cfDNA negative/AFP neg  1 hour GTT 179  3 hour GTT 92,204,159,120   GBS Neg       Plan:  Stacy Soto was discharged to home in good condition.   Discharge Medications: Allergies as of 12/03/2022   No Known Allergies   Med Rec must be completed prior to using this [facility]***        Follow-up Information     Haroldine Laws, CNM. Schedule an appointment as soon as possible for a visit in 6 week(s).   Specialty: Certified Nurse Midwife Contact information: [address] [place] [address] [phone]                 Signed: *** Hit refresh and delete this line

## 2022-12-03 NOTE — Inpatient Diabetes Management (Signed)
Inpatient Diabetes Program Recommendations  AACE/ADA: New Consensus Statement on Inpatient Glycemic Control (2015)  Target Ranges:  Prepandial:   less than 140 mg/dL      Peak postprandial:   less than 180 mg/dL (1-2 hours)      Critically ill patients:  140 - 180 mg/dL   Lab Results  Component Value Date   GLUCAP 160 (H) 12/03/2022   HGBA1C 5.9 (H) 01/01/2020    Review of Glycemic Control  Latest Reference Range & Units 12/03/22 08:36  Glucose-Capillary 70 - 99 mg/dL 703 (H)  (H): Data is abnormally high  Diabetes history: A1GDM Outpatient Diabetes medications: None Current orders for Inpatient glycemic control: IV insulin ordered  IOL for gestational hypertension and gestational diabetes  Received referral for peripartum DM management; BMZ administered on 9/6.  Glucose is 160 this am.    Please consider:  If CBG > 120 mg/dL x 2 start IV insulin.   If < 120 mg/dL order Novolog 5-00 units Q4H during labor.  Will continue to follow while inpatient.  Thank you, Dulce Sellar, MSN, CDCES Diabetes Coordinator Inpatient Diabetes Program [phone] (team pager from 8a-5p)

## 2022-12-03 NOTE — Progress Notes (Signed)
Labor Progress Note  Stacy Soto is a 22 y.o. G2P1001 at [redacted]w[redacted]d by ultrasound admitted for induction of labor due to Gestational diabetes and Hypertension.  Subjective: Pt is feeling a lot of pressure with each contraction  Objective: BP (!) 148/85   Pulse (!) 113   Temp 97.6 F (36.4 C) (Oral)   Resp 18   Ht 5\' 5"  (1.651 m)   Wt 122.5 kg   LMP 02/18/2022   SpO2 99%   BMI 44.93 kg/m   Fetal Assessment: FHT:  FHR: 125 bpm, variability: moderate,  accelerations:  Present,  decelerations:  Absent Category/reactivity:  Category I UC:   regular, every 1-3 minutes SVE:    Dilation: 9cm  Effacement: 100%  Station:  +1  Consistency: soft  Position: anterior  Membrane status: AROM at 1819 Amniotic color: clear/bloody  Labs: Lab Results  Component Value Date   WBC 8.2 12/03/2022   HGB 12.2 12/03/2022   HCT 36.4 12/03/2022   MCV 83.3 12/03/2022   PLT 197 12/03/2022    Assessment / Plan: Induction of labor due to gestational hypertension and gestational diabetes,  progressing well on pitocin 0526 Cytotec PO and PV (3rd dose) 0946 3/60/ high 1241 0/50/-3 1314 Pitocin started  Currently, pitocin was halved to 8mU for pt comfort from 16mU  Labor: Early induction progressing normally  GDM: Endo tool started and consult with DM coordinator in process  Preeclampsia:  PCR: 260; Platelets: WNL; CMP WNL I&O last 6 hours - 178mL/hr Labetalol 20mg  IV last given at 2007 Mag Sulfate started at 2031 Mag level - at 1006  = 4.7 Vitals:   12/03/22 1440 12/03/22 1455 12/03/22 1510 12/03/22 1525  BP: (!) 146/81 133/77 (!) 149/71 136/72   12/03/22 1540 12/03/22 1610 12/03/22 1625 12/03/22 1640  BP: 137/61 123/63 (!) 130/56 (!) 129/59   12/03/22 1655 12/03/22 1710 12/03/22 1725 12/03/22 1740  BP: 137/69 137/72 (!) 147/74 (!) 148/85    Fetal Wellbeing:  Category I  BMX given 2128  Pain Control:  Epidural  I/D:   Afebrile, GBS neg, Intact  Anticipated MOD:   NSVD  Cyril Mourning, CNM 12/03/2022, 6:31 PM

## 2022-12-03 NOTE — Plan of Care (Signed)
Problem: Education: Goal: Knowledge of General Education information will improve Description: Including pain rating scale, medication(s)/side effects and non-pharmacologic comfort measures Outcome: Completed/Met   Problem: Health Behavior/Discharge Planning: Goal: Ability to manage health-related needs will improve Outcome: Completed/Met   Problem: Clinical Measurements: Goal: Ability to maintain clinical measurements within normal limits will improve Outcome: Completed/Met Goal: Will remain free from infection Outcome: Completed/Met Goal: Diagnostic test results will improve Outcome: Completed/Met Goal: Respiratory complications will improve Outcome: Completed/Met Goal: Cardiovascular complication will be avoided Outcome: Completed/Met   Problem: Activity: Goal: Risk for activity intolerance will decrease Outcome: Completed/Met   Problem: Nutrition: Goal: Adequate nutrition will be maintained Outcome: Completed/Met   Problem: Coping: Goal: Level of anxiety will decrease Outcome: Completed/Met   Problem: Elimination: Goal: Will not experience complications related to bowel motility Outcome: Completed/Met Goal: Will not experience complications related to urinary retention Outcome: Completed/Met   Problem: Pain Managment: Goal: General experience of comfort will improve Outcome: Completed/Met   Problem: Safety: Goal: Ability to remain free from injury will improve Outcome: Completed/Met   Problem: Skin Integrity: Goal: Risk for impaired skin integrity will decrease Outcome: Completed/Met   Problem: Education: Goal: Knowledge of disease or condition will improve Outcome: Completed/Met Goal: Knowledge of the prescribed therapeutic regimen will improve Outcome: Completed/Met Goal: Individualized Educational Video(s) Outcome: Completed/Met   Problem: Clinical Measurements: Goal: Complications related to the disease process, condition or treatment will be  avoided or minimized Outcome: Completed/Met   Problem: Education: Goal: Knowledge of disease or condition will improve Outcome: Completed/Met Goal: Knowledge of the prescribed therapeutic regimen will improve Outcome: Completed/Met   Problem: Fluid Volume: Goal: Peripheral tissue perfusion will improve Outcome: Completed/Met   Problem: Clinical Measurements: Goal: Complications related to disease process, condition or treatment will be avoided or minimized Outcome: Completed/Met   Problem: Education: Goal: Knowledge of Childbirth will improve 12/03/2022 2227 by Priscille Heidelberg, RN Outcome: Completed/Met 12/03/2022 1953 by Priscille Heidelberg, RN Outcome: Adequate for Discharge Goal: Ability to make informed decisions regarding treatment and plan of care will improve 12/03/2022 2227 by Priscille Heidelberg, RN Outcome: Completed/Met 12/03/2022 1953 by Priscille Heidelberg, RN Outcome: Adequate for Discharge Goal: Ability to state and carry out methods to decrease the pain will improve 12/03/2022 2227 by Priscille Heidelberg, RN Outcome: Completed/Met 12/03/2022 1953 by Priscille Heidelberg, RN Outcome: Adequate for Discharge   Problem: Coping: Goal: Ability to verbalize concerns and feelings about labor and delivery will improve 12/03/2022 2227 by Priscille Heidelberg, RN Outcome: Completed/Met 12/03/2022 1953 by Priscille Heidelberg, RN Outcome: Adequate for Discharge   Problem: Life Cycle: Goal: Ability to make normal progression through stages of labor will improve 12/03/2022 2227 by Priscille Heidelberg, RN Outcome: Completed/Met 12/03/2022 1953 by Priscille Heidelberg, RN Outcome: Adequate for Discharge Goal: Ability to effectively push during vaginal delivery will improve 12/03/2022 2227 by Priscille Heidelberg, RN Outcome: Completed/Met 12/03/2022 1953 by Priscille Heidelberg, RN Outcome: Adequate for Discharge   Problem: Role Relationship: Goal: Will demonstrate positive interactions with the child 12/03/2022 2227 by  Priscille Heidelberg, RN Outcome: Completed/Met 12/03/2022 1953 by Priscille Heidelberg, RN Outcome: Adequate for Discharge   Problem: Safety: Goal: Risk of complications during labor and delivery will decrease 12/03/2022 2227 by Priscille Heidelberg, RN Outcome: Completed/Met 12/03/2022 1953 by Priscille Heidelberg, RN Outcome: Adequate for Discharge   Problem: Pain Management: Goal: Relief or control of pain from uterine contractions will improve 12/03/2022 2227 by Priscille Heidelberg,  RN Outcome: Completed/Met 12/03/2022 1953 by Priscille Heidelberg, RN Outcome: Adequate for Discharge   Problem: Education: Goal: Ability to describe self-care measures that may prevent or decrease complications (Diabetes Survival Skills Education) will improve Outcome: Completed/Met Goal: Individualized Educational Video(s) Outcome: Completed/Met   Problem: Coping: Goal: Ability to adjust to condition or change in health will improve Outcome: Completed/Met   Problem: Fluid Volume: Goal: Ability to maintain a balanced intake and output will improve Outcome: Completed/Met   Problem: Health Behavior/Discharge Planning: Goal: Ability to identify and utilize available resources and services will improve Outcome: Completed/Met Goal: Ability to manage health-related needs will improve Outcome: Completed/Met   Problem: Metabolic: Goal: Ability to maintain appropriate glucose levels will improve Outcome: Completed/Met   Problem: Nutritional: Goal: Maintenance of adequate nutrition will improve Outcome: Completed/Met Goal: Progress toward achieving an optimal weight will improve Outcome: Completed/Met   Problem: Skin Integrity: Goal: Risk for impaired skin integrity will decrease Outcome: Completed/Met   Problem: Tissue Perfusion: Goal: Adequacy of tissue perfusion will improve Outcome: Completed/Met   Problem: Education: Goal: Knowledge of condition will improve Outcome: Completed/Met Goal: Individualized  Educational Video(s) Outcome: Completed/Met Goal: Individualized Newborn Educational Video(s) Outcome: Completed/Met

## 2022-12-03 NOTE — Progress Notes (Addendum)
Labor Progress Note  Stacy Soto is a 22 y.o. G2P1001 at [redacted]w[redacted]d by ultrasound admitted for induction of labor due to Gestational diabetes and Hypertension.  Subjective: Assumed care, Dr. Feliberto Gottron up dated, Pt is comfortable per RN  Objective: BP (!) 155/96 (BP Location: Left Arm)   Pulse (!) 102   Temp 97.6 F (36.4 C) (Oral)   Resp 18   Ht 5\' 5"  (1.651 m)   Wt 122.5 kg   LMP 02/18/2022   BMI 44.93 kg/m   Fetal Assessment: FHT:  FHR: 125 bpm, variability: moderate,  accelerations:  Present,  decelerations:  Absent Category/reactivity:  Category I UC:   regular, every 1-2 minutes SVE:    Dilation: 3cm  Effacement: 60%  Station:  Floating  Consistency: soft  Position: posterior  Membrane status: Intact Amniotic color: n/a  Labs: Lab Results  Component Value Date   WBC 5.8 12/02/2022   HGB 11.9 (L) 12/02/2022   HCT 34.5 (L) 12/02/2022   MCV 81.8 12/02/2022   PLT 177 12/02/2022    Assessment / Plan: Induction of labor due to gestational hypertension and gestational diabetes,  progressing well on pitocin 2004 0.5/50/-2 2203 Cytotec PO and PV 0124 1.5/50/-2 0124 Cytotec PO and PV 0519 3/60/-2 0526 Cytotec PO and PV 0946 3/60/ high  Labor: Early induction progressing normally  GDM: 0836 fasting 160; DM coordinator consult placed  Preeclampsia:  PCR: 260; Platelets: WNL; CMP WNL I&O last shift - 242mL/hr Labetalol 20mg  IV last given at 2007 Mag Sulfate started at 2031 Mag level ordered Vitals:   12/02/22 1910 12/02/22 1925 12/02/22 2156 12/02/22 2306  BP: (!) 129/108 (!) 139/93 (!) 147/92 (!) 149/77   12/03/22 0019 12/03/22 0112 12/03/22 0157 12/03/22 0305  BP: (!) 144/83 (!) 156/94 (!) 144/71 (!) 151/91   12/03/22 0412 12/03/22 0529 12/03/22 0645 12/03/22 0830  BP: 133/76 (!) 145/76 (!) 156/85 (!) 155/96    Fetal Wellbeing:  Category I  BMX given 2128  Pain Control:  Labor support without medications, requesting  epidural  I/D:   Afebrile, GBS neg, Intact  Anticipated MOD:  NSVD  Cyril Mourning, CNM 12/03/2022, 9:43 AM

## 2022-12-03 NOTE — Anesthesia Preprocedure Evaluation (Signed)
Anesthesia Evaluation  Patient identified by MRN, date of birth, ID band Patient awake    Reviewed: Allergy & Precautions, NPO status , Patient's Chart, lab work & pertinent test results  History of Anesthesia Complications Negative for: history of anesthetic complications  Airway Mallampati: II  TM Distance: >3 FB Neck ROM: Full    Dental no notable dental hx. (+) Teeth Intact   Pulmonary sleep apnea , neg COPD, Patient abstained from smoking.Not current smoker, former smoker   Pulmonary exam normal breath sounds clear to auscultation       Cardiovascular Exercise Tolerance: Good METShypertension, Pt. on medications (-) CAD and (-) Past MI (-) dysrhythmias  Rhythm:Regular Rate:Normal - Systolic murmurs Pre eclampsia with severe features, on magnesium infusion   Neuro/Psych  PSYCHIATRIC DISORDERS Anxiety Depression    negative neurological ROS     GI/Hepatic ,neg GERD  ,,(+)     (-) substance abuse    Endo/Other  diabetes, Well Controlled, Gestational  Morbid obesity  Renal/GU negative Renal ROS     Musculoskeletal   Abdominal  (+) + obese  Peds  Hematology   Anesthesia Other Findings Past Medical History: No date: Anxiety No date: Atypical chest pain No date: Depression No date: Gestational diabetes     Comment:  taking glyburide No date: Heart murmur     Comment:  mild mitral regurigation by echo No date: Hypertension No date: IBS (irritable bowel syndrome) No date: Mitral regurgitation     Comment:  a. 03/2020 Echo: EF 60-65%, no rwma, nl RV fxn. Mild MR. No date: Morbid obesity (HCC) No date: Orthodontics     Comment:  wears braces No date: Palpitations 03/04/2022: Prediabetes No date: Pregnancy induced hypertension No date: PVCs (premature ventricular contractions)     Comment:  a. 06/2021 Zio: Avg HR 94 (51-152). Isolated PVCs w/               burden of 22%. Triggered events = sinus rhythm w/  PVCs.  Reproductive/Obstetrics                             Anesthesia Physical Anesthesia Plan  ASA: 3  Anesthesia Plan: Epidural   Post-op Pain Management:    Induction:   PONV Risk Score and Plan: 2 and Treatment may vary due to age or medical condition and Ondansetron  Airway Management Planned: Natural Airway  Additional Equipment:   Intra-op Plan:   Post-operative Plan:   Informed Consent: I have reviewed the patients History and Physical, chart, labs and discussed the procedure including the risks, benefits and alternatives for the proposed anesthesia with the patient or authorized representative who has indicated his/her understanding and acceptance.       Plan Discussed with: Surgeon  Anesthesia Plan Comments: (Discussed R/B/A of neuraxial anesthesia technique with patient: - rare risks of spinal/epidural hematoma, nerve damage, infection - Risk of PDPH - Risk of itching - Risk of nausea and vomiting - Risk of poor block necessitating replacement of epidural. - Risk of allergic reactions. Patient voiced understanding. Patient informed about increased incidence of perioperative risk , and epidural difficulties and complications, due to high BMI. Patient understands.  )       Anesthesia Quick Evaluation

## 2022-12-03 NOTE — Progress Notes (Signed)
L&D Note    Subjective:  Contractions are starting to feel more intense and more frequent  Objective:   Vitals:   12/03/22 0305 12/03/22 0412 12/03/22 0529 12/03/22 0645  BP: (!) 151/91 133/76 (!) 145/76 (!) 156/85  Pulse: 93 (!) 104 (!) 112 (!) 102  Resp: 19 19 18 20   Temp:  98.1 F (36.7 C) 98.2 F (36.8 C) 98.1 F (36.7 C)  TempSrc:  Oral Oral Oral  Weight:      Height:        Current Vital Signs 24h Vital Sign Ranges  T 98.1 F (36.7 C) Temp  Avg: 98.1 F (36.7 C)  Min: 97.8 F (36.6 C)  Max: 98.3 F (36.8 C)  BP (!) 156/85 BP  Min: 129/108  Max: 162/88  HR (!) 102 Pulse  Avg: 102.6  Min: 85  Max: 116  RR 20 Resp  Avg: 18.4  Min: 16  Max: 20  SaO2     No data recorded      Gen: alert, cooperative, no distress FHR: Baseline: 125 bpm, Variability: moderate, Accels: Present, Decels: none Toco: regular, every 2-5 minutes SVE: Dilation: 3 Effacement (%): 60 Cervical Position: Middle Station: -2 Exam by:: [place] Guptill RN Presentation: cephalic by [place]   Medications SCHEDULED MEDICATIONS   ammonia       betamethasone acetate-betamethasone sodium phosphate  12 mg Intramuscular Q24 Hr x 2   labetalol       lidocaine (PF)       misoprostol       oxytocin       oxytocin 40 units in LR 1000 mL  333 mL Intravenous Once   Oxytocin-Sodium Chloride       sodium chloride flush  3 mL Intravenous Q12H    MEDICATION INFUSIONS   sodium chloride     lactated ringers     lactated ringers     lactated ringers 75 mL/hr at 12/03/22 0022   magnesium sulfate 2 g/hr (12/02/22 2319)   oxytocin     oxytocin      PRN MEDICATIONS  sodium chloride, acetaminophen, ammonia, calcium carbonate, fentaNYL (SUBLIMAZE) injection, labetalol **AND** labetalol **AND** labetalol **AND** hydrALAZINE **AND** Measure blood pressure, labetalol, lactated ringers, lidocaine (PF), lidocaine (PF), misoprostol, ondansetron, oxytocin, Oxytocin-Sodium Chloride, sodium chloride flush, sodium citrate-citric  acid, terbutaline   Assessment & Plan:  22 y.o. G2P1001 at [redacted]w[redacted]d admitted for Chronic HTN with superimposed preeclampsia  -Labor: cervical ripening with misoprostol -Fetal Well-being: Category I -GBS: negative -Membranes intact -Continue present management. Consider induction with oxytocin as next step. -Preeclampsia - mag sulfate infusing, BP's normal to mild range, no severe range requiring treatment since 2007 -Analgesia: unmedicated labor support options . Discussed epidural, she is considering for pain management in labor  -Presentation confirmed with bedside US d/t ballotable position and report of increased fetal movement. Noted to be cephalic by [place].    Gustavo Lah, CNM  12/03/2022 7:38 AM  [facility]

## 2022-12-03 NOTE — Progress Notes (Signed)
Labor Progress Note  Stacy Soto is a 22 y.o. G2P1001 at [redacted]w[redacted]d by ultrasound admitted for induction of labor due to Gestational diabetes and Hypertension.  Subjective: Pt is comfortable per RN  Objective: BP (!) 148/85   Pulse (!) 113   Temp 97.6 F (36.4 C) (Oral)   Resp 18   Ht 5\' 5"  (1.651 m)   Wt 122.5 kg   LMP 02/18/2022   SpO2 99%   BMI 44.93 kg/m   Fetal Assessment: FHT:  FHR: 130 bpm, variability: moderate,  accelerations:  Present,  decelerations:  Absent Category/reactivity:  Category I UC:   regular, every 1-3 minutes SVE:    Dilation: Closed  Effacement: 50%  Station:  -3  Consistency: soft  Position: middle  Membrane status: Intact Amniotic color: n/a  Labs: Lab Results  Component Value Date   WBC 8.2 12/03/2022   HGB 12.2 12/03/2022   HCT 36.4 12/03/2022   MCV 83.3 12/03/2022   PLT 197 12/03/2022    Assessment / Plan: Induction of labor due to gestational hypertension and gestational diabetes,  progressing well on pitocin 2004 0.5/50/-2 2203 Cytotec PO and PV 0124 1.5/50/-2 0124 Cytotec PO and PV 0519 3/60/-2 0526 Cytotec PO and PV 0946 3/60/ high 1241 0/50/-3 Will start pitocin   Labor: Early induction progressing normally  GDM: Endo tool started and consult with DM coordinator in process  Preeclampsia:  PCR: 260; Platelets: WNL; CMP WNL I&O last 6 hours - 336mL/hr Labetalol 20mg  IV last given at 2007 Mag Sulfate started at 2031 Mag level - 4.7 Vitals:   12/03/22 1440 12/03/22 1455 12/03/22 1510 12/03/22 1525  BP: (!) 146/81 133/77 (!) 149/71 136/72   12/03/22 1540 12/03/22 1610 12/03/22 1625 12/03/22 1640  BP: 137/61 123/63 (!) 130/56 (!) 129/59   12/03/22 1655 12/03/22 1710 12/03/22 1725 12/03/22 1740  BP: 137/69 137/72 (!) 147/74 (!) 148/85    Fetal Wellbeing:  Category I  BMX given 2128  Pain Control:  Epidural  I/D:   Afebrile, GBS neg, Intact  Anticipated MOD:  NSVD  Cyril Mourning,  CNM 12/03/2022, 6:22 PM

## 2022-12-04 ENCOUNTER — Encounter: Payer: Self-pay | Admitting: Obstetrics and Gynecology

## 2022-12-04 LAB — CBC
HCT: 29.8 % — ABNORMAL LOW (ref 36.0–46.0)
Hemoglobin: 10 g/dL — ABNORMAL LOW (ref 12.0–15.0)
MCH: 28 pg (ref 26.0–34.0)
MCHC: 33.6 g/dL (ref 30.0–36.0)
MCV: 83.5 fL (ref 80.0–100.0)
Platelets: 175 10*3/uL (ref 150–400)
RBC: 3.57 MIL/uL — ABNORMAL LOW (ref 3.87–5.11)
RDW: 13 % (ref 11.5–15.5)
WBC: 11 10*3/uL — ABNORMAL HIGH (ref 4.0–10.5)
nRBC: 0 % (ref 0.0–0.2)

## 2022-12-04 MED ORDER — LABETALOL HCL 200 MG PO TABS
200.0000 mg | ORAL_TABLET | Freq: Two times a day (BID) | ORAL | Status: DC
  Administered 2022-12-04 – 2022-12-05 (×3): 200 mg via ORAL
  Filled 2022-12-04 (×3): qty 1

## 2022-12-04 MED ORDER — MAGNESIUM SULFATE 40 GM/1000ML IV SOLN
INTRAVENOUS | Status: AC
  Filled 2022-12-04: qty 1000

## 2022-12-04 MED ORDER — MAGNESIUM SULFATE 40 GM/1000ML IV SOLN
2.0000 g/h | INTRAVENOUS | Status: AC
  Administered 2022-12-04: 2 g/h via INTRAVENOUS

## 2022-12-04 MED ORDER — NIFEDIPINE ER OSMOTIC RELEASE 30 MG PO TB24: 30.0000 mg | ORAL_TABLET | Freq: Every day | ORAL | Status: DC

## 2022-12-04 MED ORDER — LACTATED RINGERS IV SOLN: INTRAVENOUS | Status: DC

## 2022-12-04 NOTE — Progress Notes (Signed)
Post Partum Day 1  Subjective: Doing well, no concerns. Ambulating without difficulty, pain managed with PO meds, tolerating regular diet, and voiding without difficulty.   No fever/chills, chest pain, shortness of breath, nausea/vomiting, or leg pain. No nipple or breast pain. No headache, visual changes, or RUQ/epigastric pain.  Objective: BP 120/68 (BP Location: Right Arm)   Pulse 90   Temp 98 F (36.7 C) (Oral)   Resp 16   Ht 5\' 5"  (1.651 m)   Wt 122.5 kg   LMP 02/18/2022   SpO2 98%   Breastfeeding Unknown   BMI 44.93 kg/m    Physical Exam:  General: alert and cooperative Breasts: soft/nontender CV: RRR Pulm: nl effort Abdomen: soft, non-tender Uterine Fundus: firm Incision: n/a Perineum: minimal edema, intact Lochia: appropriate DVT Evaluation: No evidence of DVT seen on physical exam. Edinburgh:     08/04/2020   10:47 AM 07/28/2020    9:55 AM  Edinburgh Postnatal Depression Scale Screening Tool  I have been able to laugh and see the funny side of things. 0 0  I have looked forward with enjoyment to things. 0 0  I have blamed myself unnecessarily when things went wrong. 2 1  I have been anxious or worried for no good reason. 1 2  I have felt scared or panicky for no good reason. 0 0  Things have been getting on top of me. 0 0  I have been so unhappy that I have had difficulty sleeping. 0 0  I have felt sad or miserable. 0 0  I have been so unhappy that I have been crying. 0 1  The thought of harming myself has occurred to me. 0 0  Edinburgh Postnatal Depression Scale Total 3 4     Recent Labs    12/03/22 1006 12/04/22 0521  HGB 12.2 10.0*  HCT 36.4 29.8*  WBC 8.2 11.0*  PLT 197 175    Assessment/Plan: 22 y.o. G2P1102 postpartum day # 1  1. Continue routine postpartum care  2. Infant feeding status: breast and formula feeding -Lactation consult PRN for breastfeeding   3. Contraception plan: IUD  4. Acute blood loss anemia - clinically  significant.  -Hemodynamically stable and asymptomatic -Intervention: continue on oral supplementation with ferrous sulfate 325  5. Immunization status:   all immunizations up to date  6. Pre-E:  Continue 24 hour Mag Sulfate BPs 106-137 / 66-78 Output: last shift = 169mL/hr Continue CHTN BP medications  Disposition: Continue inpatient postpartum care    LOS: 2 days   Jatavis Malek, CNM 12/04/2022, 12:30 PM

## 2022-12-04 NOTE — Anesthesia Postprocedure Evaluation (Signed)
Anesthesia Post Note  Patient: [name]  Procedure(s) Performed: AN AD HOC LABOR EPIDURAL  Patient location during evaluation: L&D Anesthesia Type: Epidural Level of consciousness: oriented and awake and alert Pain management: pain level controlled Vital Signs Assessment: post-procedure vital signs reviewed and stable Respiratory status: spontaneous breathing and respiratory function stable Cardiovascular status: blood pressure returned to baseline and stable Postop Assessment: no headache, no backache, no apparent nausea or vomiting and able to ambulate Anesthetic complications: no   No notable events documented.   Last Vitals:  Vitals:   12/04/22 1130 12/04/22 1230  BP: 120/68 115/75  Pulse: 90 86  Resp: 16 16  Temp: 36.7 C   SpO2:      Last Pain:  Vitals:   12/04/22 1130  TempSrc: Oral  PainSc:                  Louie Boston

## 2022-12-05 ENCOUNTER — Ambulatory Visit: Payer: Medicaid Other

## 2022-12-05 MED ORDER — NIFEDIPINE ER OSMOTIC RELEASE 30 MG PO TB24
30.0000 mg | ORAL_TABLET | Freq: Every day | ORAL | Status: DC
  Administered 2022-12-05: 30 mg via ORAL
  Filled 2022-12-05: qty 1

## 2022-12-05 NOTE — Progress Notes (Signed)
Postpartum Day  2  Subjective: 22 y.o. [id number] postpartum day #2 status post normal spontaneous vaginal delivery. She is ambulating, is tolerating po, is voiding spontaneously.  Her pain is well controlled on PO pain medications. Her lochia is less than menses.  Objective: BP (!) 142/87 (BP Location: Right Arm)   Pulse 78   Temp 98.5 F (36.9 C) (Oral)   Resp 17   Ht 5\' 5"  (1.651 m)   Wt 122.5 kg   LMP 02/18/2022   SpO2 100%   Breastfeeding Unknown   BMI 44.93 kg/m    Physical Exam:  General: alert, cooperative, and appears stated age Breasts: soft/nontender Pulm: nl effort Abdomen: soft, non-tender, active bowel sounds Uterine Fundus: firm Perineum: minimal edema, intact Lochia: appropriate DVT Evaluation: No evidence of DVT seen on physical exam. Negative Homan's sign. No cords or calf tenderness. No significant calf/ankle edema.  Recent Labs    12/03/22 1006 12/04/22 0521  HGB 12.2 10.0*  HCT 36.4 29.8*  WBC 8.2 11.0*  PLT 197 175    Assessment/Plan: 22 y.o. G2P1102 postpartum day # 2  1. Continue routine postpartum care  2. Infant feeding status: breast and formula feeding --Lactation consult PRN for breastfeeding    3. Contraception plan: IUD  4. Acute blood loss anemia - clinically significant.  --Hemodynamically stable and asymptomatic= --Intervention: start on oral supplementation with ferrous sulfate 325 mg   5. Immunization status:   needs MMR and Varicella prior to discharge   6. Continue to monitor BP, currently on 200 mg Labetalol BID, Procardia 30 mg added today  Disposition: continue inpatient postpartum care , plan for discharge home tomorrow    LOS: 3 days   ----- Chari Manning Certified Nurse Midwife [facility]

## 2022-12-06 MED ORDER — NIFEDIPINE ER OSMOTIC RELEASE 30 MG PO TB24
30.0000 mg | ORAL_TABLET | Freq: Once | ORAL | Status: AC
  Administered 2022-12-06: 30 mg via ORAL
  Filled 2022-12-06: qty 1

## 2022-12-06 MED ORDER — NIFEDIPINE ER OSMOTIC RELEASE 30 MG PO TB24
60.0000 mg | ORAL_TABLET | Freq: Every day | ORAL | Status: DC
  Administered 2022-12-06: 60 mg via ORAL
  Filled 2022-12-06: qty 2

## 2022-12-06 MED ORDER — NIFEDIPINE ER OSMOTIC RELEASE 30 MG PO TB24
90.0000 mg | ORAL_TABLET | Freq: Every day | ORAL | Status: DC
  Administered 2022-12-07: 90 mg via ORAL
  Filled 2022-12-06: qty 3

## 2022-12-06 MED ORDER — LABETALOL HCL 200 MG PO TABS
300.0000 mg | ORAL_TABLET | Freq: Two times a day (BID) | ORAL | Status: DC
  Administered 2022-12-06 – 2022-12-07 (×4): 300 mg via ORAL
  Filled 2022-12-06 (×4): qty 1

## 2022-12-06 NOTE — Progress Notes (Signed)
Post Partum Day 3 Subjective: Doing well, no complaints.  Tolerating regular diet, pain with PO meds, voiding and ambulating without difficulty.  No CP SOB Fever,Chills, N/V or leg pain; denies nipple or breast pain, no HA change of vision, RUQ/epigastric pain  Objective: BP (!) 144/90 (BP Location: Right Arm) Comment: nurse Huston Foley notified  Pulse 68   Temp 98.3 F (36.8 C) (Oral)   Resp 20   Ht 5\' 5"  (1.651 m)   Wt 122.5 kg   LMP 02/18/2022   SpO2 97%   Breastfeeding Unknown   BMI 44.93 kg/m    Vitals:   12/04/22 1837 12/04/22 2022 12/04/22 2045 12/05/22 0045  BP: (!) 146/85 (!) 147/89 (!) 146/80 132/70   12/05/22 0421 12/05/22 0810 12/05/22 1200 12/05/22 1600  BP: (!) 141/82 (!) 142/87 (!) 148/85 (!) 148/82   12/05/22 2046 12/05/22 2232 12/06/22 0302 12/06/22 0803  BP: (!) 156/76 (!) 155/86 (!) 141/86 (!) 144/90    Physical Exam:  General: NAD Breasts: soft/nontender  CV: RRR Pulm: nl effort, CTABL Abdomen: soft, NT, BS x 4 Perineum:  minimal edema, intact Lochia: moderate Uterine Fundus: fundus firm and 2 fb below umbilicus DVT Evaluation: no cords, ttp LEs   Recent Labs    12/03/22 1006 12/04/22 0521  HGB 12.2 10.0*  HCT 36.4 29.8*  WBC 8.2 11.0*  PLT 197 175    Assessment/Plan: 22 y.o. G2P1102 postpartum day # 3  - Continue routine PP care - Lactation consult PRN  - Discussed contraceptive options including implant, IUDs hormonal and non-hormonal, injection, pills/ring/patch, condoms, and NFP.  - Acute blood loss anemia, clinically insignificant - hemoglobin changed from 12.2 to 10.0, patient is asymptomatic, hemodynamically stable; start po ferrous sulfate BID with stool softeners, - Immunization status: Needs MMR prior to DC - chronic HTN/superimposed pre-eclampsia: BP medications increased to Procardia 60mg  XL and Labetalol 300mg  BID. No severe range BP.   Disposition: Does not desire Dc home today, infant will remain inpatient overnight.    Janyce Llanos, CNM 12/06/2022 9:12 AM

## 2022-12-07 ENCOUNTER — Other Ambulatory Visit: Payer: Self-pay

## 2022-12-07 MED ORDER — NIFEDIPINE ER OSMOTIC RELEASE 90 MG PO TB24
90.0000 mg | ORAL_TABLET | Freq: Every day | ORAL | 3 refills | Status: AC
  Filled 2022-12-07: qty 90, 90d supply, fill #0

## 2022-12-07 MED ORDER — CALCIUM CARBONATE ANTACID 500 MG PO CHEW
2.0000 | CHEWABLE_TABLET | ORAL | Status: DC | PRN
  Administered 2022-12-07 (×2): 400 mg via ORAL
  Filled 2022-12-07 (×2): qty 2

## 2022-12-07 MED ORDER — NIFEDIPINE ER 90 MG PO TB24: 90.0000 mg | ORAL_TABLET | Freq: Every day | ORAL | 3 refills | Status: DC

## 2022-12-07 MED ORDER — LABETALOL HCL 300 MG PO TABS
300.0000 mg | ORAL_TABLET | Freq: Two times a day (BID) | ORAL | 3 refills | Status: AC
  Filled 2022-12-07: qty 20, 10d supply, fill #0

## 2022-12-07 MED ORDER — LABETALOL HCL 300 MG PO TABS: 300.0000 mg | ORAL_TABLET | Freq: Two times a day (BID) | ORAL | 3 refills | Status: DC

## 2022-12-07 MED ORDER — SENNOSIDES-DOCUSATE SODIUM 8.6-50 MG PO TABS: 2.0000 | ORAL_TABLET | Freq: Every day | ORAL | Status: AC

## 2022-12-07 MED ORDER — OXYCODONE HCL 5 MG PO TABS
5.0000 mg | ORAL_TABLET | ORAL | 0 refills | Status: AC | PRN
  Filled 2022-12-07: qty 30, 5d supply, fill #0

## 2022-12-07 NOTE — Lactation Note (Signed)
This note was copied from a baby's chart. Lactation Consultation Note  Patient Name: Stacy Soto Date: 12/07/2022 Age:22 days Reason for consult: Initial assessment;Late-preterm 34-36.6wks   Maternal Data Lactation to room for an initial consultation w/ P2 patient and 4 day old late preterm infant, [redacted]w[redacted]d, [name] "Stacy Soto".  Upon entry into room patient using the DEBP.  Patient stated that her goal is to breastfeed or provide breastmilk.  Patient informed LC she had a bad experience with breastfeeding her first.  Patient stated that she has elastic nipples.  At the current time patient is using a 21mm flanges. Patient stated that she is pumping every 2-3hrs.    Does the patient have breastfeeding experience prior to this delivery?: Yes How long did the patient breastfeed?: Short period of time (never gave an answer on how long)  Feeding   Mother's Current Feeding Choice: Breast Milk   Lactation Tools Discussed/Used LC had patient move up one size with the flanges to a 24mm.  By observation, patients nipples are swelling in the flanges.  LC informed patient she will return to size her nipples at rest.  During this visit, patient was able to pump 60ml of EBM.   Interventions LC provided education on the following; LPT infant feeding behaviors, milk production expectations, # of pee/poopie diapers per day, engorgement prevention/treatment, [facility] outpatient services, and pumping at least 8x w/in a 24hr period. Patient verbalized understanding.   LC reviewed the importance of waking infant if he doesn't wake up on his own at hr 3.  Patient verbalized understanding.   Interventions: Breast feeding basics reviewed;Breast massage;Expressed milk;DEBP;Education;CDC Guidelines for Breast Pump Cleaning  Discharge LC will review Late Preterm Infant Discharge Plan before discharging.  Pump: Personal;Hands Free;DEBP (Personal - Mom Cozy, Motiff (electric)) WIC Program: Yes  Consult  Status Consult Status: Follow-up Follow-up type: In-patient    Yvette Rack Free 12/07/2022, 11:48 AM

## 2022-12-07 NOTE — Discharge Instructions (Signed)

## 2022-12-07 NOTE — Lactation Note (Signed)
This note was copied from a baby's chart. Lactation Consultation Note  Patient Name: Stacy Soto [id number] Date: 12/07/2022 Age:22 years Reason for consult: Follow-up assessment   Maternal Data Lactation to room for a follow up assessment.  Patient stated that she will not be discharged today because of infants bili numbers that have risen.  Infant is currently under bili lights.  Patient expressed that at last feeding infant ate 60ml of EBM that she pumped.    Patient verbalized to [facility] that she was upset because she had not seen her 22yr old but now she is a little better because she doesn't want her infant to be readmitted.   Feeding Mother's Current Feeding Choice: Breast Milk Nipple Type: Dr. Irving Burton Preemie  Interventions LC and patient discussed her pumping and feeding schedule.  Patient is consistently pumping every 2-3hrs and getting between 50-17ml of EBM.  LC praised patient for what she is doing.    Lactation will provide a Late Preterm Infant Discharge Plan when patient discharges.    LC provided patient with Hydrogel pads and taught patient how to properly use them, pointing out instructions. Patient verbalized understanding   Interventions: Education  Consult Status Consult Status: Follow-up Follow-up type: In-patient    Yvette Rack Free 12/07/2022, 4:16 PM

## 2022-12-08 ENCOUNTER — Ambulatory Visit: Payer: Self-pay

## 2022-12-08 ENCOUNTER — Telehealth: Payer: Self-pay

## 2022-12-08 NOTE — Telephone Encounter (Signed)
[facility]- Discharge Call Backs-Left Voicemail about the following below. 1-Do you have any questions or concerns about yourself as you heal? 2-Any concerns or questions about your baby? Is your baby eating, peeing,pooping well? 3 Reviewed ABC's of safe sleep. 4-How was your stay at the hospital? 5- Did our team work together to care for you? You should be receiving a survey in the mail soon.   We would really appreciate it if you could fill that out for us and return it in the mail.  We value the feedback to make improvements and continue the great work we do.   If you have any questions please feel free to call me back at [phone]  

## 2022-12-08 NOTE — Lactation Note (Signed)
This note was copied from a baby's chart. Lactation Consultation Note  Patient Name: Boy Evalee Huckeba [id number] Date: 12/08/2022 Age:22 years Reason for consult: Follow-up assessment;Late-preterm 34-36.6wks   Maternal Data Lactation to room for follow up assessment w/ baby boy [place].  Mother of baby stated that feeding overnight went well and infant has even gained weight.  RN taking infant off of bili lights while present in room.    Feeding Mother's Current Feeding Choice: Breast Milk  Discharge LC provided mother of baby with a Late Preterm Infant Discharge Education Plan.  LC reviewed the plan with mom and mom verbalized understanding.  LC encouraged mom to reach out to lactation for questions or if she had concerns in regards to breastfeeding once she is home.    Discharge Education: Other (comment) (Late Preterm Discharge Plan Given)  Consult Status Consult Status: Complete Follow-up type: Call as needed    Yvette Rack Free 12/08/2022, 10:45 AM

## 2023-01-13 ENCOUNTER — Encounter: Payer: Self-pay | Admitting: Adult Health

## 2023-01-13 ENCOUNTER — Ambulatory Visit (INDEPENDENT_AMBULATORY_CARE_PROVIDER_SITE_OTHER): Payer: Medicaid Other | Admitting: Adult Health

## 2023-01-13 VITALS — BP 124/84 | HR 68 | Temp 97.8°F | Ht 65.0 in | Wt 241.0 lb

## 2023-01-13 DIAGNOSIS — F5105 Insomnia due to other mental disorder: Secondary | ICD-10-CM | POA: Diagnosis not present

## 2023-01-13 DIAGNOSIS — G4733 Obstructive sleep apnea (adult) (pediatric): Secondary | ICD-10-CM | POA: Diagnosis not present

## 2023-01-13 NOTE — Progress Notes (Signed)
@Patient  ID: Stacy Soto, female    DOB: 2001/02/22, 22 y.o.   MRN: [id number]  Chief Complaint  Patient presents with   Follow-up    Referring provider: [facility]  HPI: 22 year old female seen for sleep consult October 12, 2021 for insomnia and daytime sleepiness found to have mild obstructive sleep apnea  TEST/EVENTS :  Home sleep study done on November 22, 2021 shows mild sleep apnea with a AHI at 6.9/hour and SPO2 low at 89%.   01/13/2023 Follow up ; OSA  Patient presents for 1 year follow-up.  Patient has underlying mild obstructive sleep apnea.  Last visit patient was referred to orthodontics for evaluation of oral appliance. Went for evaluation but was unable to afford. She is still having trouble sleeping with restless sleep. Has trouble going to sleep, sometimes it takes her up to 2 hrs to go sleep. Wakes up frequently. Wakes up tired. Feels tired all the time.  She is 5 weeks post partum. Has a baby boy at home. And 2 yr old.  Is on B12 and magnesium supplement.    No Known Allergies  Immunization History  Administered Date(s) Administered   DTaP 06/30/2000, 08/17/2000, 10/24/2000, 05/04/2001, 08/27/2004   HPV Quadrivalent 12/26/2012, 02/26/2013, 07/15/2013   Hepatitis A, Ped/Adol-2 Dose 07/28/2010, 12/07/2011   IPV 06/30/2000, 08/17/2000, 01/18/2001, 08/27/2004   Influenza Nasal 03/07/2011   Influenza-Unspecified 12/26/2012   MMR 05/04/2001, 08/27/2004   Meningococcal Conjugate 05/11/2017   Meningococcal Mcv4o 12/26/2012   Pneumococcal-Unspecified 06/30/2000, 08/17/2000, 10/24/2000, 05/04/2001   Tdap 07/28/2010, 05/28/2020   Varicella 09/06/2001, 12/13/2005    Past Medical History:  Diagnosis Date   Anxiety    Atypical chest pain    Depression    Gestational diabetes    taking glyburide   Heart murmur    mild mitral regurigation by echo   Hypertension    IBS (irritable bowel syndrome)    Mitral regurgitation    a. 03/2020 Echo: EF 60-65%,  no rwma, nl RV fxn. Mild MR.   Morbid obesity (HCC)    Orthodontics    wears braces   Palpitations    Prediabetes 03/04/2022   Pregnancy induced hypertension    PVCs (premature ventricular contractions)    a. 06/2021 [facility]: Avg HR 94 (51-152). Isolated PVCs w/ burden of 22%. Triggered events = sinus rhythm w/ PVCs.    Tobacco History: Social History   Tobacco Use  Smoking Status Former   Types: E-cigarettes   Quit date: 11/01/2019   Years since quitting: 3.2  Smokeless Tobacco Never   Counseling given: Not Answered   Outpatient Medications Prior to Visit  Medication Sig Dispense Refill   escitalopram (LEXAPRO) 10 MG tablet Take 10 mg by mouth daily.     NIFEdipine (PROCARDIA XL/NIFEDICAL-XL) 90 MG 24 hr tablet Take 1 tablet (90 mg total) by mouth daily. 90 tablet 3   oxyCODONE (OXY IR/ROXICODONE) 5 MG immediate release tablet Take 1 tablet (5 mg total) by mouth every 4 (four) hours as needed (pain scale 4-7). 30 tablet 0   Prenatal Vit-Fe Fumarate-FA (PRENATAL MULTIVITAMIN) TABS tablet Take 1 tablet by mouth daily at 12 noon.     senna-docusate (SENOKOT-S) 8.6-50 MG tablet Take 2 tablets by mouth daily.     cyanocobalamin (VITAMIN B12) 500 MCG tablet Take 500 mcg by mouth daily. Pt states they are injections every week at [facility]  Dose above might not be accurate to the injections. (Patient not taking: Reported on 01/13/2023)  labetalol (NORMODYNE) 300 MG tablet Take 1 tablet (300 mg total) by mouth 2 (two) times daily. (Patient not taking: Reported on 01/13/2023) 180 tablet 3   No facility-administered medications prior to visit.     Review of Systems:   Constitutional:   No  weight loss, night sweats,  Fevers, chills, +fatigue, or  lassitude.  HEENT:   No headaches,  Difficulty swallowing,  Tooth/dental problems, or  Sore throat,                No sneezing, itching, ear ache, nasal congestion, post nasal drip,   CV:  No chest pain,  Orthopnea, PND, swelling in lower  extremities, anasarca, dizziness, palpitations, syncope.   GI  No heartburn, indigestion, abdominal pain, nausea, vomiting, diarrhea, change in bowel habits, loss of appetite, bloody stools.   Resp: No shortness of breath with exertion or at rest.  No excess mucus, no productive cough,  No non-productive cough,  No coughing up of blood.  No change in color of mucus.  No wheezing.  No chest wall deformity  Skin: no rash or lesions.  GU: no dysuria, change in color of urine, no urgency or frequency.  No flank pain, no hematuria   MS:  No joint pain or swelling.  No decreased range of motion.  No back pain.    Physical Exam  BP 124/84 (BP Location: Right Arm, Cuff Size: Large)   Pulse 68   Temp 97.8 F (36.6 C)   Ht 5\' 5"  (1.651 m)   Wt 241 lb (109.3 kg)   LMP 02/18/2022   SpO2 99%   BMI 40.10 kg/m   GEN: A/Ox3; pleasant , NAD, well nourished    HEENT:  [place]/AT,  NOSE-clear, THROAT-clear, no lesions, no postnasal drip or exudate noted.   NECK:  Supple w/ fair ROM; no JVD; normal carotid impulses w/o bruits; no thyromegaly or nodules palpated; no lymphadenopathy.    RESP  Clear  P & A; w/o, wheezes/ rales/ or rhonchi. no accessory muscle use, no dullness to percussion  CARD:  RRR, no m/r/g, no peripheral edema, pulses intact, no cyanosis or clubbing.  GI:   Soft & nt; nml bowel sounds; no organomegaly or masses detected.   Musco: Warm bil, no deformities or joint swelling noted.   Neuro: alert, no focal deficits noted.    Skin: Warm, no lesions or rashes    Lab Results:  CBC    BNP No results found for: "BNP"  ProBNP No results found for: "PROBNP"  Imaging: No results found.  Administration History     None           No data to display          No results found for: "NITRICOXIDE"      Assessment & Plan:   OSA (obstructive sleep apnea) Mild OSA with significant symptom burden -unable to afford oral appliance  Begin CPAP At bedtime  -auto  5-15 cmH2O.  Healthy sleep regimen   Plan  Patient Instructions  Begin CPAP At bedtime, wear all night long for at least 6hr or more  Try Dream wear nasal mask .  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  Begin CPAP At  bedtime  .   Plan  Patient Instructions  Begin CPAP At bedtime, wear all night long for at least 6hr or more  Try Dream wear nasal mask .  Work on healthy sleep regimen  Do not drive if sleepy  Work on healthy weight  Follow up in 3-4 months and As needed        Marathon Oil, NP 01/13/2023

## 2023-01-13 NOTE — Patient Instructions (Addendum)
Begin CPAP At bedtime, wear all night long for at least 6hr or more  Try Dream wear nasal mask .  Work on healthy sleep regimen  Do not drive if sleepy  Work on healthy weight  Follow up in 3-4 months and As needed

## 2023-01-13 NOTE — Assessment & Plan Note (Signed)
Chronic insomnia , healthy sleep regimen discussed  Hold on sleep meds as she is nursing .  Begin CPAP At bedtime  .   Plan  Patient Instructions  Begin CPAP At bedtime, wear all night long for at least 6hr or more  Try Dream wear nasal mask .  Work on healthy sleep regimen  Do not drive if sleepy  Work on healthy weight  Follow up in 3-4 months and As needed

## 2023-01-13 NOTE — Assessment & Plan Note (Signed)
Mild OSA with significant symptom burden -unable to afford oral appliance  Begin CPAP At bedtime  -auto 5-15 cmH2O.  Healthy sleep regimen   Plan  Patient Instructions  Begin CPAP At bedtime, wear all night long for at least 6hr or more  Try Dream wear nasal mask .  Work on healthy sleep regimen  Do not drive if sleepy  Work on healthy weight  Follow up in 3-4 months and As needed
